# Patient Record
Sex: Female | Born: 1960 | Race: White | Hispanic: No | Marital: Married | Smoking: Current every day smoker
Health system: Southern US, Community
[De-identification: ages and names within clinical notes are randomized; demographics above are authoritative.]

## PROBLEM LIST (undated history)

## (undated) DIAGNOSIS — M545 Low back pain, unspecified: Secondary | ICD-10-CM

## (undated) DIAGNOSIS — H269 Unspecified cataract: Secondary | ICD-10-CM

## (undated) DIAGNOSIS — F329 Major depressive disorder, single episode, unspecified: Secondary | ICD-10-CM

## (undated) DIAGNOSIS — Z780 Asymptomatic menopausal state: Secondary | ICD-10-CM

## (undated) DIAGNOSIS — J45909 Unspecified asthma, uncomplicated: Secondary | ICD-10-CM

## (undated) DIAGNOSIS — F32A Depression, unspecified: Secondary | ICD-10-CM

## (undated) DIAGNOSIS — M479 Spondylosis, unspecified: Secondary | ICD-10-CM

## (undated) DIAGNOSIS — B1920 Unspecified viral hepatitis C without hepatic coma: Secondary | ICD-10-CM

## (undated) DIAGNOSIS — E0590 Thyrotoxicosis, unspecified without thyrotoxic crisis or storm: Secondary | ICD-10-CM

## (undated) DIAGNOSIS — E039 Hypothyroidism, unspecified: Secondary | ICD-10-CM

## (undated) DIAGNOSIS — M1990 Unspecified osteoarthritis, unspecified site: Secondary | ICD-10-CM

## (undated) HISTORY — DX: Depression, unspecified: F32.A

## (undated) HISTORY — DX: Thyrotoxicosis, unspecified without thyrotoxic crisis or storm: E05.90

## (undated) HISTORY — DX: Unspecified viral hepatitis C without hepatic coma: B19.20

## (undated) HISTORY — DX: Low back pain, unspecified: M54.50

## (undated) HISTORY — DX: Unspecified asthma, uncomplicated: J45.909

## (undated) HISTORY — DX: Hypothyroidism, unspecified: E03.9

## (undated) HISTORY — DX: Major depressive disorder, single episode, unspecified: F32.9

## (undated) HISTORY — DX: Unspecified cataract: H26.9

## (undated) HISTORY — PX: COLONOSCOPY: SHX174

## (undated) HISTORY — DX: Asymptomatic menopausal state: Z78.0

## (undated) HISTORY — PX: COLONOSCOPY W/ POLYPECTOMY: SHX1380

## (undated) HISTORY — DX: Unspecified osteoarthritis, unspecified site: M19.90

## (undated) HISTORY — DX: Spondylosis, unspecified: M47.9

## (undated) HISTORY — DX: Low back pain: M54.5

---

## 1998-05-15 ENCOUNTER — Other Ambulatory Visit: Admission: RE | Admit: 1998-05-15 | Discharge: 1998-05-15 | Payer: Self-pay | Admitting: Gynecology

## 2000-07-16 ENCOUNTER — Other Ambulatory Visit: Admission: RE | Admit: 2000-07-16 | Discharge: 2000-07-16 | Payer: Self-pay | Admitting: Family Medicine

## 2000-12-22 ENCOUNTER — Encounter (INDEPENDENT_AMBULATORY_CARE_PROVIDER_SITE_OTHER): Payer: Self-pay

## 2000-12-22 ENCOUNTER — Ambulatory Visit (HOSPITAL_COMMUNITY): Admission: RE | Admit: 2000-12-22 | Discharge: 2000-12-22 | Payer: Self-pay | Admitting: Gastroenterology

## 2001-04-15 ENCOUNTER — Encounter: Admission: RE | Admit: 2001-04-15 | Discharge: 2001-04-15 | Payer: Self-pay | Admitting: Family Medicine

## 2001-04-15 ENCOUNTER — Encounter: Payer: Self-pay | Admitting: Family Medicine

## 2001-08-28 ENCOUNTER — Encounter: Payer: Self-pay | Admitting: *Deleted

## 2001-08-28 ENCOUNTER — Ambulatory Visit (HOSPITAL_COMMUNITY): Admission: RE | Admit: 2001-08-28 | Discharge: 2001-08-28 | Payer: Self-pay | Admitting: *Deleted

## 2001-08-30 ENCOUNTER — Encounter: Payer: Self-pay | Admitting: *Deleted

## 2001-08-30 ENCOUNTER — Ambulatory Visit (HOSPITAL_COMMUNITY): Admission: RE | Admit: 2001-08-30 | Discharge: 2001-08-30 | Payer: Self-pay | Admitting: *Deleted

## 2001-09-17 ENCOUNTER — Ambulatory Visit (HOSPITAL_COMMUNITY): Admission: RE | Admit: 2001-09-17 | Discharge: 2001-09-17 | Payer: Self-pay | Admitting: Neurosurgery

## 2002-01-26 ENCOUNTER — Encounter: Admission: RE | Admit: 2002-01-26 | Discharge: 2002-01-26 | Payer: Self-pay

## 2002-05-01 ENCOUNTER — Emergency Department (HOSPITAL_COMMUNITY): Admission: EM | Admit: 2002-05-01 | Discharge: 2002-05-01 | Payer: Self-pay | Admitting: Emergency Medicine

## 2002-07-19 ENCOUNTER — Encounter: Payer: Self-pay | Admitting: Family Medicine

## 2002-07-19 ENCOUNTER — Ambulatory Visit (HOSPITAL_COMMUNITY): Admission: RE | Admit: 2002-07-19 | Discharge: 2002-07-19 | Payer: Self-pay | Admitting: Family Medicine

## 2003-05-01 ENCOUNTER — Encounter: Admission: RE | Admit: 2003-05-01 | Discharge: 2003-05-01 | Payer: Self-pay | Admitting: Family Medicine

## 2003-05-01 ENCOUNTER — Encounter: Payer: Self-pay | Admitting: Family Medicine

## 2003-05-16 ENCOUNTER — Encounter
Admission: RE | Admit: 2003-05-16 | Discharge: 2003-08-14 | Payer: Self-pay | Admitting: Physical Medicine & Rehabilitation

## 2003-06-19 ENCOUNTER — Other Ambulatory Visit: Admission: RE | Admit: 2003-06-19 | Discharge: 2003-06-19 | Payer: Self-pay | Admitting: Family Medicine

## 2003-07-06 ENCOUNTER — Encounter
Admission: RE | Admit: 2003-07-06 | Discharge: 2003-07-21 | Payer: Self-pay | Admitting: Physical Medicine & Rehabilitation

## 2003-11-02 ENCOUNTER — Encounter
Admission: RE | Admit: 2003-11-02 | Discharge: 2004-01-31 | Payer: Self-pay | Admitting: Physical Medicine & Rehabilitation

## 2004-01-05 ENCOUNTER — Encounter: Admission: RE | Admit: 2004-01-05 | Discharge: 2004-01-05 | Payer: Self-pay | Admitting: Family Medicine

## 2004-03-29 ENCOUNTER — Encounter
Admission: RE | Admit: 2004-03-29 | Discharge: 2004-06-27 | Payer: Self-pay | Admitting: Physical Medicine & Rehabilitation

## 2004-04-08 ENCOUNTER — Ambulatory Visit (HOSPITAL_COMMUNITY)
Admission: RE | Admit: 2004-04-08 | Discharge: 2004-04-08 | Payer: Self-pay | Admitting: Physical Medicine & Rehabilitation

## 2004-05-10 ENCOUNTER — Ambulatory Visit (HOSPITAL_COMMUNITY): Admission: RE | Admit: 2004-05-10 | Discharge: 2004-05-10 | Payer: Self-pay | Admitting: Family Medicine

## 2004-05-13 ENCOUNTER — Encounter: Admission: RE | Admit: 2004-05-13 | Discharge: 2004-05-13 | Payer: Self-pay | Admitting: Family Medicine

## 2004-06-25 ENCOUNTER — Encounter: Admission: RE | Admit: 2004-06-25 | Discharge: 2004-06-25 | Payer: Self-pay | Admitting: Family Medicine

## 2004-06-27 ENCOUNTER — Encounter
Admission: RE | Admit: 2004-06-27 | Discharge: 2004-09-25 | Payer: Self-pay | Admitting: Physical Medicine & Rehabilitation

## 2004-07-01 ENCOUNTER — Ambulatory Visit: Payer: Self-pay | Admitting: Physical Medicine & Rehabilitation

## 2004-09-26 ENCOUNTER — Encounter
Admission: RE | Admit: 2004-09-26 | Discharge: 2004-11-27 | Payer: Self-pay | Admitting: Physical Medicine & Rehabilitation

## 2004-09-30 ENCOUNTER — Ambulatory Visit: Payer: Self-pay | Admitting: Physical Medicine & Rehabilitation

## 2004-11-02 IMAGING — CR DG HAND COMPLETE 3+V*R*
4 series · 4 of 4 positions shown · non-contrast
Comparison: 01/26/02.
COMPARISON: 01/26/02.

CLINICAL DATA: Bilateral hand pain, greater on the left. 
 COMPLETE LEFT HAND

[view not recorded (1 of 4)]
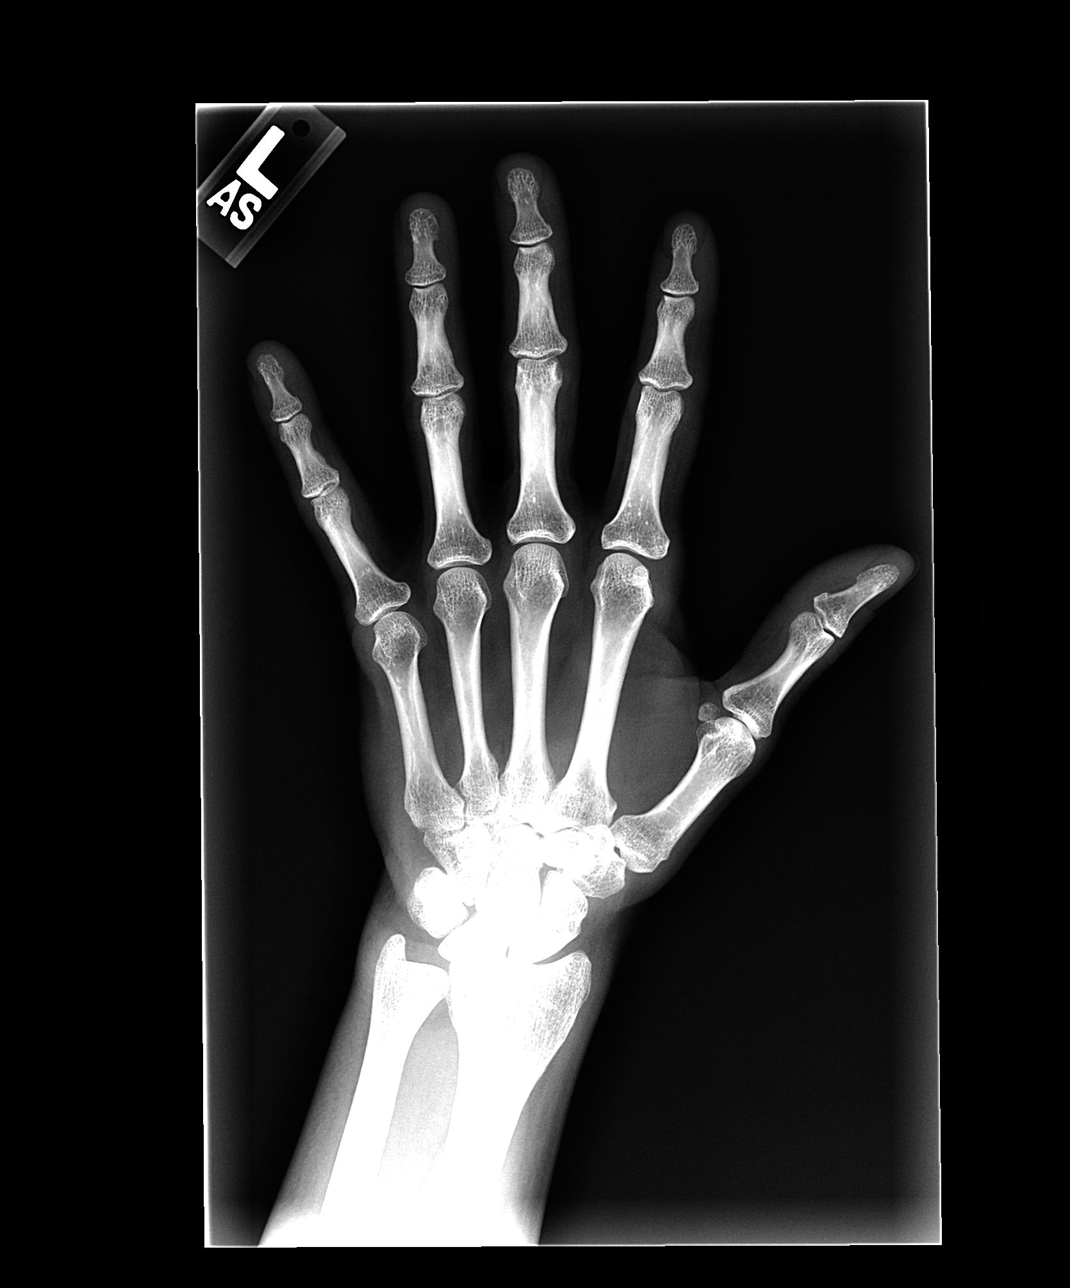

[view not recorded (2 of 4)]
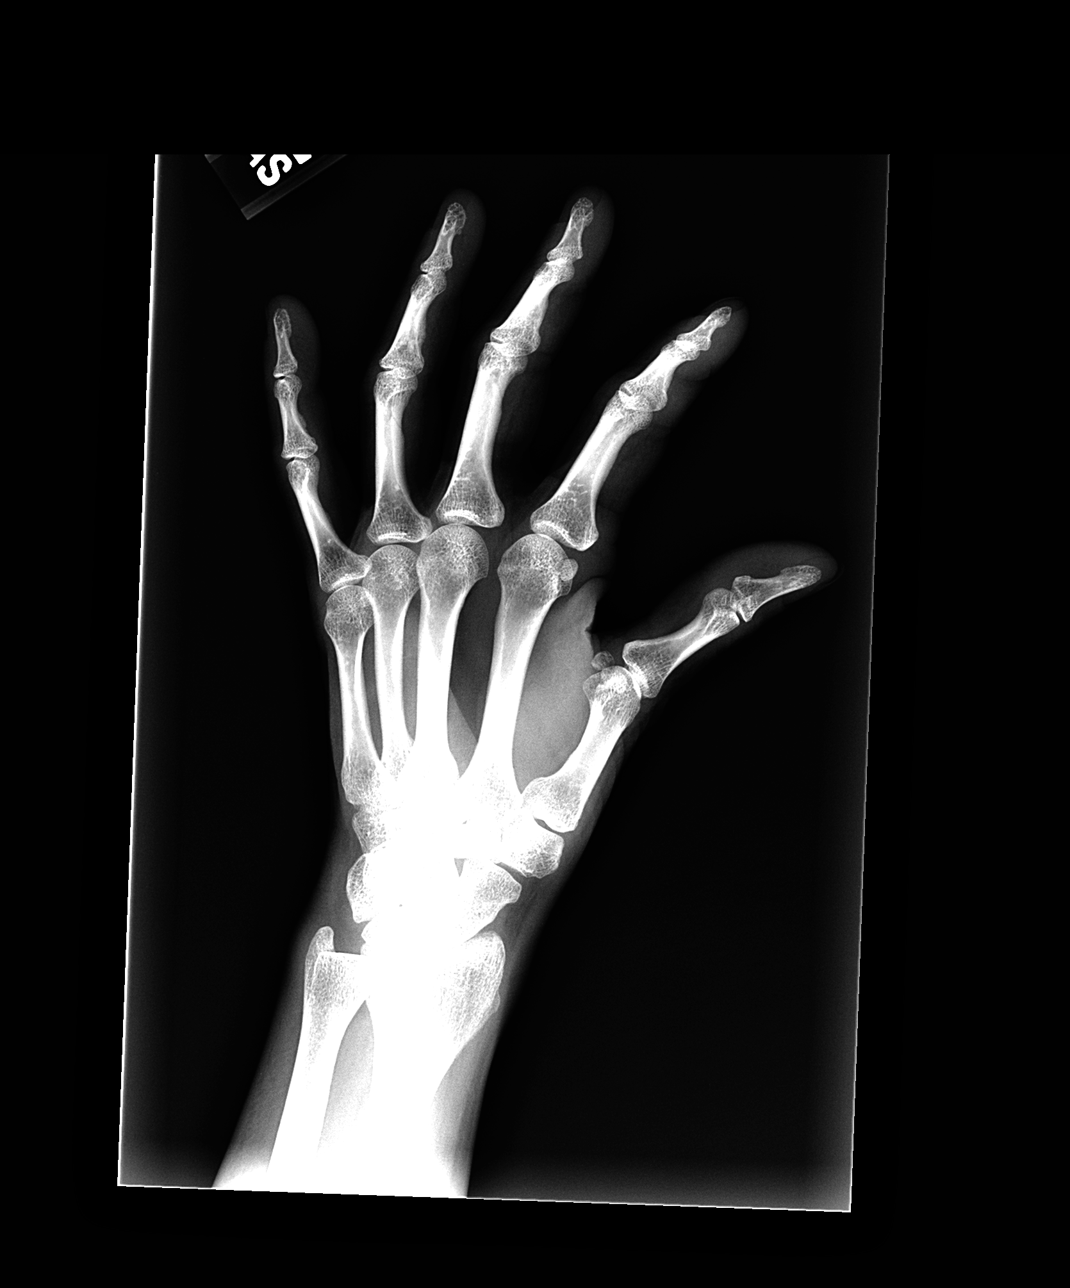

[view not recorded (3 of 4)]
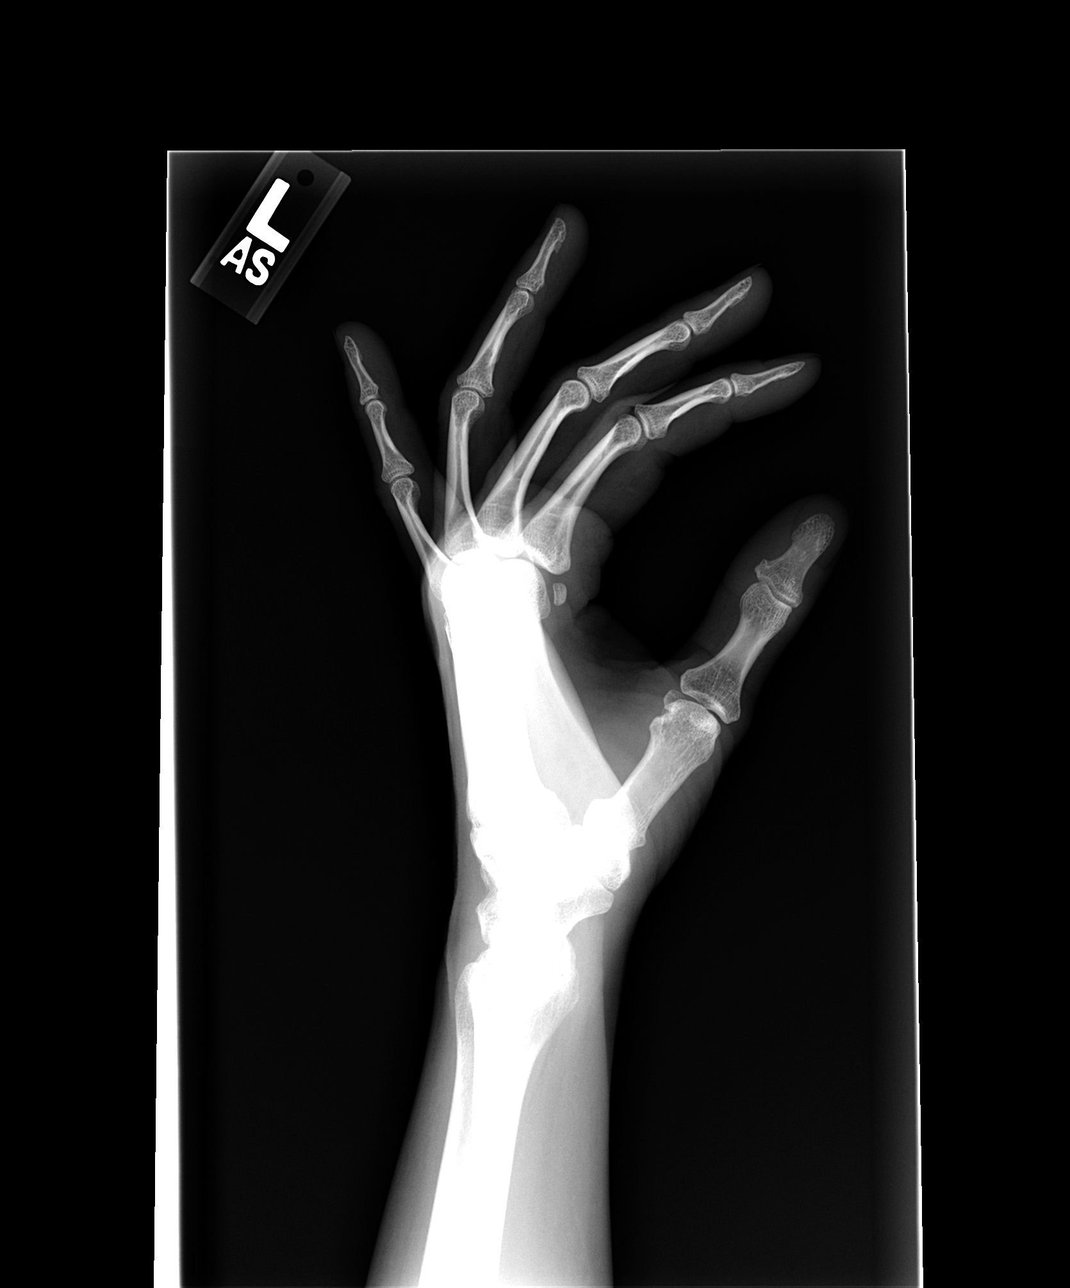

[view not recorded (4 of 4)]
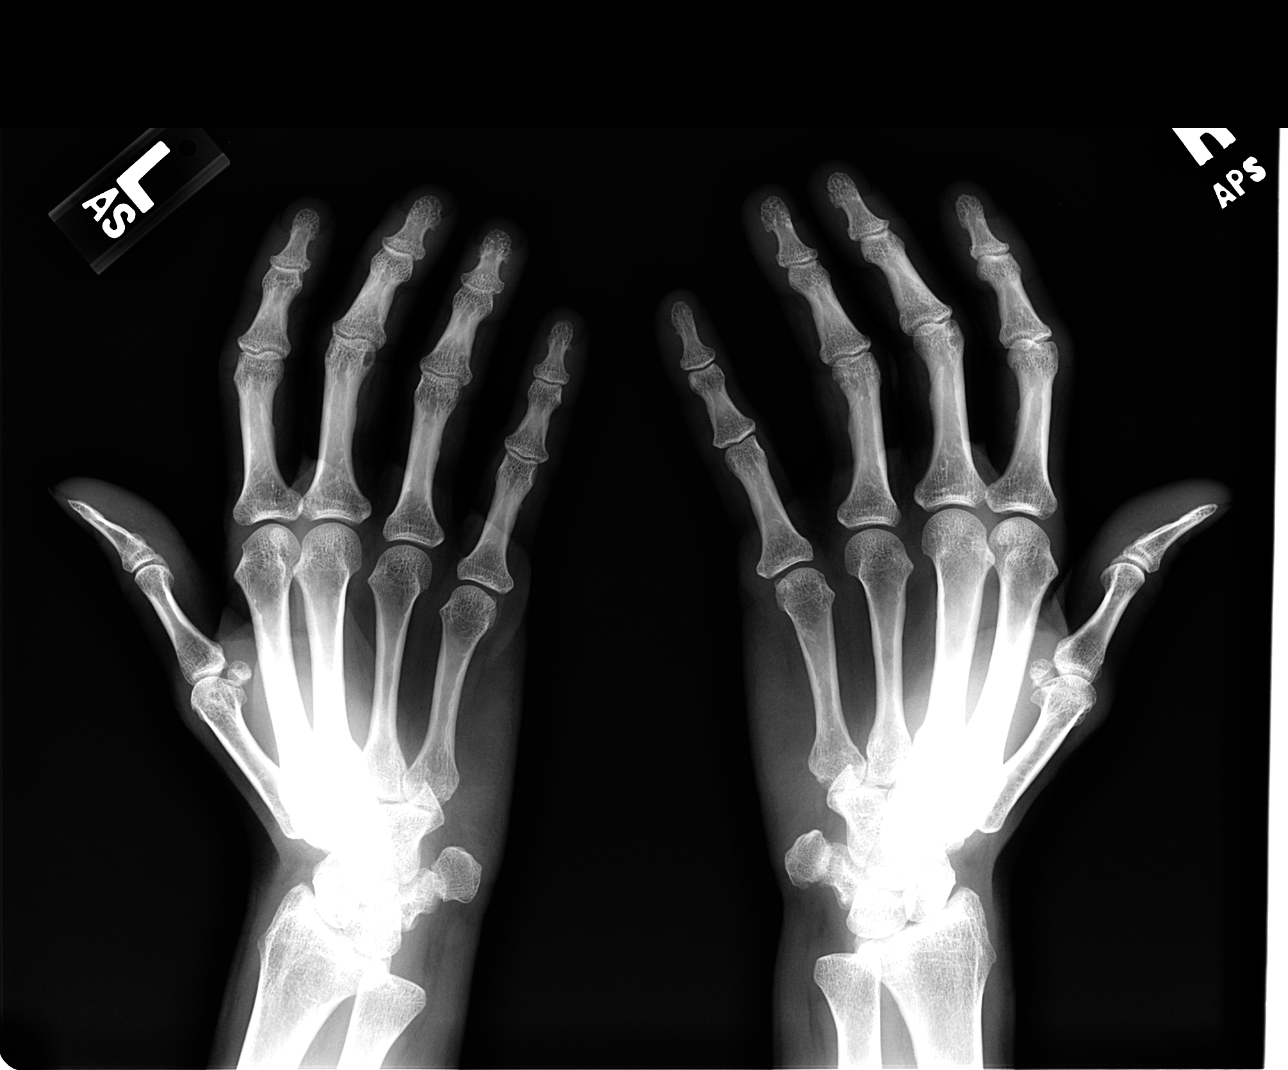

[4 of 4 positions shown; findings below may reference images not displayed]

Four views of the left hand demonstrate normal appearing bones and soft tissues.  No bone erosions, joint space narrowing or spur formation. 
 IMPRESSION
 Normal examination. 
 COMPLETE RIGHT HAND
Three views of the right hand demonstrate normal appearing bones and soft tissues.  No bone erosions, joint space narrowing or spur formation. 
 IMPRESSION
 Normal examination.

## 2004-11-25 ENCOUNTER — Encounter: Admission: RE | Admit: 2004-11-25 | Discharge: 2004-11-25 | Payer: Self-pay | Admitting: Family Medicine

## 2004-11-27 ENCOUNTER — Ambulatory Visit: Payer: Self-pay | Admitting: Physical Medicine & Rehabilitation

## 2004-11-27 ENCOUNTER — Encounter
Admission: RE | Admit: 2004-11-27 | Discharge: 2005-02-25 | Payer: Self-pay | Admitting: Physical Medicine & Rehabilitation

## 2005-08-22 ENCOUNTER — Ambulatory Visit: Payer: Self-pay | Admitting: Internal Medicine

## 2005-08-28 ENCOUNTER — Encounter: Admission: RE | Admit: 2005-08-28 | Discharge: 2005-08-28 | Payer: Self-pay | Admitting: Internal Medicine

## 2005-09-16 ENCOUNTER — Ambulatory Visit: Payer: Self-pay | Admitting: Internal Medicine

## 2005-09-19 ENCOUNTER — Ambulatory Visit: Payer: Self-pay | Admitting: Cardiology

## 2005-10-16 ENCOUNTER — Ambulatory Visit: Payer: Self-pay | Admitting: Internal Medicine

## 2005-10-20 ENCOUNTER — Ambulatory Visit (HOSPITAL_COMMUNITY): Admission: RE | Admit: 2005-10-20 | Discharge: 2005-10-20 | Payer: Self-pay | Admitting: Internal Medicine

## 2005-10-23 ENCOUNTER — Ambulatory Visit: Payer: Self-pay | Admitting: Internal Medicine

## 2005-10-27 ENCOUNTER — Ambulatory Visit (HOSPITAL_COMMUNITY): Admission: RE | Admit: 2005-10-27 | Discharge: 2005-10-27 | Payer: Self-pay | Admitting: Internal Medicine

## 2005-11-07 ENCOUNTER — Ambulatory Visit: Payer: Self-pay | Admitting: Internal Medicine

## 2005-11-17 ENCOUNTER — Ambulatory Visit: Payer: Self-pay | Admitting: Endocrinology

## 2005-11-24 ENCOUNTER — Encounter (HOSPITAL_COMMUNITY): Admission: RE | Admit: 2005-11-24 | Discharge: 2006-02-22 | Payer: Self-pay | Admitting: Endocrinology

## 2005-12-05 ENCOUNTER — Ambulatory Visit (HOSPITAL_COMMUNITY): Admission: RE | Admit: 2005-12-05 | Discharge: 2005-12-05 | Payer: Self-pay | Admitting: Endocrinology

## 2005-12-10 ENCOUNTER — Ambulatory Visit: Payer: Self-pay | Admitting: Internal Medicine

## 2005-12-25 ENCOUNTER — Ambulatory Visit: Payer: Self-pay | Admitting: Endocrinology

## 2006-01-07 ENCOUNTER — Ambulatory Visit: Payer: Self-pay | Admitting: Internal Medicine

## 2006-03-23 ENCOUNTER — Ambulatory Visit: Payer: Self-pay | Admitting: Internal Medicine

## 2006-03-31 ENCOUNTER — Ambulatory Visit: Payer: Self-pay | Admitting: Endocrinology

## 2006-05-20 ENCOUNTER — Ambulatory Visit: Payer: Self-pay | Admitting: Endocrinology

## 2006-05-21 ENCOUNTER — Ambulatory Visit: Payer: Self-pay | Admitting: Internal Medicine

## 2006-05-21 ENCOUNTER — Ambulatory Visit: Payer: Self-pay | Admitting: Endocrinology

## 2006-07-22 ENCOUNTER — Ambulatory Visit: Payer: Self-pay | Admitting: Internal Medicine

## 2006-09-16 ENCOUNTER — Ambulatory Visit: Payer: Self-pay | Admitting: Internal Medicine

## 2006-11-19 ENCOUNTER — Ambulatory Visit: Payer: Self-pay | Admitting: Internal Medicine

## 2007-01-21 ENCOUNTER — Ambulatory Visit: Payer: Self-pay | Admitting: Internal Medicine

## 2007-03-20 ENCOUNTER — Ambulatory Visit: Payer: Self-pay | Admitting: Family Medicine

## 2007-03-22 ENCOUNTER — Ambulatory Visit: Payer: Self-pay | Admitting: Internal Medicine

## 2007-03-27 ENCOUNTER — Encounter: Payer: Self-pay | Admitting: Internal Medicine

## 2007-03-27 DIAGNOSIS — M810 Age-related osteoporosis without current pathological fracture: Secondary | ICD-10-CM | POA: Insufficient documentation

## 2007-04-16 ENCOUNTER — Ambulatory Visit: Payer: Self-pay | Admitting: Internal Medicine

## 2007-06-04 DIAGNOSIS — M545 Low back pain, unspecified: Secondary | ICD-10-CM | POA: Insufficient documentation

## 2007-06-04 DIAGNOSIS — F329 Major depressive disorder, single episode, unspecified: Secondary | ICD-10-CM

## 2007-06-04 DIAGNOSIS — M1990 Unspecified osteoarthritis, unspecified site: Secondary | ICD-10-CM | POA: Insufficient documentation

## 2007-06-16 ENCOUNTER — Ambulatory Visit: Payer: Self-pay | Admitting: Internal Medicine

## 2007-06-20 ENCOUNTER — Encounter: Payer: Self-pay | Admitting: Internal Medicine

## 2007-08-11 ENCOUNTER — Encounter: Payer: Self-pay | Admitting: Internal Medicine

## 2007-08-16 ENCOUNTER — Ambulatory Visit: Payer: Self-pay | Admitting: Internal Medicine

## 2007-10-11 ENCOUNTER — Telehealth: Payer: Self-pay | Admitting: Internal Medicine

## 2007-10-25 ENCOUNTER — Ambulatory Visit: Payer: Self-pay | Admitting: Internal Medicine

## 2007-10-27 ENCOUNTER — Encounter (INDEPENDENT_AMBULATORY_CARE_PROVIDER_SITE_OTHER): Payer: Self-pay | Admitting: *Deleted

## 2007-10-29 LAB — CONVERTED CEMR LAB
CO2: 30 meq/L (ref 19–32)
Creatinine, Ser: 0.8 mg/dL (ref 0.4–1.2)
Ketones, ur: NEGATIVE mg/dL
Leukocytes, UA: NEGATIVE
Nitrite: NEGATIVE
Potassium: 4.7 meq/L (ref 3.5–5.1)
Sodium: 138 meq/L (ref 135–145)
Specific Gravity, Urine: 1.01 (ref 1.000–1.03)
Urine Glucose: NEGATIVE mg/dL
pH: 8 (ref 5.0–8.0)

## 2007-11-09 ENCOUNTER — Encounter: Admission: RE | Admit: 2007-11-09 | Discharge: 2007-11-09 | Payer: Self-pay | Admitting: Internal Medicine

## 2007-11-11 ENCOUNTER — Telehealth: Payer: Self-pay | Admitting: Internal Medicine

## 2007-11-17 ENCOUNTER — Encounter: Admission: RE | Admit: 2007-11-17 | Discharge: 2007-11-17 | Payer: Self-pay | Admitting: Internal Medicine

## 2007-12-01 ENCOUNTER — Encounter: Payer: Self-pay | Admitting: Internal Medicine

## 2008-01-11 ENCOUNTER — Ambulatory Visit: Payer: Self-pay | Admitting: Internal Medicine

## 2008-01-11 DIAGNOSIS — J209 Acute bronchitis, unspecified: Secondary | ICD-10-CM

## 2008-03-13 ENCOUNTER — Telehealth: Payer: Self-pay | Admitting: Internal Medicine

## 2008-03-20 ENCOUNTER — Ambulatory Visit: Payer: Self-pay | Admitting: Internal Medicine

## 2008-05-03 ENCOUNTER — Telehealth: Payer: Self-pay | Admitting: Internal Medicine

## 2008-05-29 ENCOUNTER — Ambulatory Visit: Payer: Self-pay | Admitting: Internal Medicine

## 2008-05-29 DIAGNOSIS — J189 Pneumonia, unspecified organism: Secondary | ICD-10-CM

## 2008-06-12 ENCOUNTER — Encounter: Payer: Self-pay | Admitting: Internal Medicine

## 2008-06-12 ENCOUNTER — Telehealth: Payer: Self-pay | Admitting: Internal Medicine

## 2008-07-11 ENCOUNTER — Telehealth: Payer: Self-pay | Admitting: Internal Medicine

## 2008-08-14 ENCOUNTER — Telehealth: Payer: Self-pay | Admitting: Internal Medicine

## 2008-08-22 ENCOUNTER — Ambulatory Visit: Payer: Self-pay | Admitting: Internal Medicine

## 2008-10-31 ENCOUNTER — Telehealth: Payer: Self-pay | Admitting: Internal Medicine

## 2008-10-31 ENCOUNTER — Emergency Department (HOSPITAL_COMMUNITY): Admission: EM | Admit: 2008-10-31 | Discharge: 2008-10-31 | Payer: Self-pay | Admitting: Emergency Medicine

## 2008-10-31 DIAGNOSIS — R109 Unspecified abdominal pain: Secondary | ICD-10-CM | POA: Insufficient documentation

## 2008-11-03 ENCOUNTER — Ambulatory Visit: Payer: Self-pay | Admitting: Internal Medicine

## 2008-11-03 DIAGNOSIS — K560 Paralytic ileus: Secondary | ICD-10-CM

## 2008-12-05 ENCOUNTER — Telehealth: Payer: Self-pay | Admitting: Internal Medicine

## 2008-12-27 ENCOUNTER — Telehealth: Payer: Self-pay | Admitting: Internal Medicine

## 2009-01-01 ENCOUNTER — Telehealth: Payer: Self-pay | Admitting: Internal Medicine

## 2009-01-04 ENCOUNTER — Ambulatory Visit: Payer: Self-pay | Admitting: Internal Medicine

## 2009-03-06 ENCOUNTER — Telehealth: Payer: Self-pay | Admitting: Internal Medicine

## 2009-03-07 ENCOUNTER — Telehealth: Payer: Self-pay | Admitting: Internal Medicine

## 2009-03-14 ENCOUNTER — Ambulatory Visit: Payer: Self-pay | Admitting: Internal Medicine

## 2009-03-14 DIAGNOSIS — L719 Rosacea, unspecified: Secondary | ICD-10-CM

## 2009-03-14 LAB — CONVERTED CEMR LAB
ALT: 16 units/L (ref 0–35)
Bilirubin Urine: NEGATIVE
CO2: 32 meq/L (ref 19–32)
Calcium: 9.1 mg/dL (ref 8.4–10.5)
Creatinine, Ser: 0.8 mg/dL (ref 0.4–1.2)
Eosinophils Relative: 1.7 % (ref 0.0–5.0)
GFR calc non Af Amer: 80.93 mL/min (ref >60)
Leukocytes, UA: NEGATIVE
Lymphs Abs: 1.3 10*3/uL (ref 0.7–4.0)
Monocytes Relative: 10.7 % (ref 3.0–12.0)
Neutro Abs: 4.4 10*3/uL (ref 1.4–7.7)
Neutrophils Relative %: 67.8 % (ref 43.0–77.0)
Nitrite: NEGATIVE
Sed Rate: 9 mm/hr (ref 0–22)
Sodium: 139 meq/L (ref 135–145)
TSH: 3.03 microintl units/mL (ref 0.35–5.50)
Total Protein: 6.5 g/dL (ref 6.0–8.3)
Urobilinogen, UA: 0.2 (ref 0.0–1.0)
Vitamin B-12: 406 pg/mL (ref 211–911)
WBC: 6.5 10*3/uL (ref 4.5–10.5)
pH: 6 (ref 5.0–8.0)

## 2009-04-23 ENCOUNTER — Telehealth: Payer: Self-pay | Admitting: Internal Medicine

## 2009-05-01 ENCOUNTER — Telehealth: Payer: Self-pay | Admitting: Internal Medicine

## 2009-05-04 ENCOUNTER — Ambulatory Visit: Payer: Self-pay | Admitting: Internal Medicine

## 2009-05-04 DIAGNOSIS — J449 Chronic obstructive pulmonary disease, unspecified: Secondary | ICD-10-CM

## 2009-06-05 ENCOUNTER — Telehealth: Payer: Self-pay | Admitting: Internal Medicine

## 2009-06-13 ENCOUNTER — Ambulatory Visit: Payer: Self-pay | Admitting: Internal Medicine

## 2009-07-25 ENCOUNTER — Ambulatory Visit: Payer: Self-pay | Admitting: Internal Medicine

## 2009-07-25 DIAGNOSIS — Z87891 Personal history of nicotine dependence: Secondary | ICD-10-CM

## 2009-09-03 ENCOUNTER — Telehealth: Payer: Self-pay | Admitting: Internal Medicine

## 2009-09-27 ENCOUNTER — Ambulatory Visit: Payer: Self-pay | Admitting: Internal Medicine

## 2009-10-22 ENCOUNTER — Telehealth: Payer: Self-pay | Admitting: Internal Medicine

## 2009-11-20 ENCOUNTER — Telehealth: Payer: Self-pay | Admitting: Internal Medicine

## 2009-11-26 ENCOUNTER — Telehealth: Payer: Self-pay | Admitting: Internal Medicine

## 2009-12-18 ENCOUNTER — Telehealth: Payer: Self-pay | Admitting: Internal Medicine

## 2010-01-17 ENCOUNTER — Telehealth: Payer: Self-pay | Admitting: Internal Medicine

## 2010-03-15 ENCOUNTER — Ambulatory Visit: Payer: Self-pay | Admitting: Internal Medicine

## 2010-03-15 DIAGNOSIS — L049 Acute lymphadenitis, unspecified: Secondary | ICD-10-CM

## 2010-03-19 ENCOUNTER — Ambulatory Visit: Payer: Self-pay | Admitting: Endocrinology

## 2010-03-19 DIAGNOSIS — E039 Hypothyroidism, unspecified: Secondary | ICD-10-CM | POA: Insufficient documentation

## 2010-03-19 DIAGNOSIS — E042 Nontoxic multinodular goiter: Secondary | ICD-10-CM | POA: Insufficient documentation

## 2010-03-22 ENCOUNTER — Encounter: Admission: RE | Admit: 2010-03-22 | Discharge: 2010-03-22 | Payer: Self-pay | Admitting: Endocrinology

## 2010-04-18 ENCOUNTER — Ambulatory Visit: Payer: Self-pay | Admitting: Internal Medicine

## 2010-07-02 ENCOUNTER — Encounter: Payer: Self-pay | Admitting: Internal Medicine

## 2010-07-19 ENCOUNTER — Telehealth: Payer: Self-pay | Admitting: Internal Medicine

## 2010-08-13 ENCOUNTER — Encounter: Payer: Self-pay | Admitting: Internal Medicine

## 2010-08-13 ENCOUNTER — Ambulatory Visit: Payer: Self-pay | Admitting: Internal Medicine

## 2010-08-13 DIAGNOSIS — J069 Acute upper respiratory infection, unspecified: Secondary | ICD-10-CM

## 2010-08-14 ENCOUNTER — Telehealth: Payer: Self-pay | Admitting: Internal Medicine

## 2010-08-15 LAB — CONVERTED CEMR LAB
AST: 23 units/L (ref 0–37)
Albumin: 4.2 g/dL (ref 3.5–5.2)
BUN: 13 mg/dL (ref 6–23)
Basophils Relative: 0.4 % (ref 0.0–3.0)
Bilirubin Urine: NEGATIVE
Bilirubin, Direct: 0 mg/dL (ref 0.0–0.3)
Calcium: 9 mg/dL (ref 8.4–10.5)
Cholesterol: 188 mg/dL (ref 0–200)
Eosinophils Absolute: 0.1 10*3/uL (ref 0.0–0.7)
Eosinophils Relative: 1.9 % (ref 0.0–5.0)
GFR calc non Af Amer: 78.2 mL/min (ref >60)
HCT: 37.7 % (ref 36.0–46.0)
Leukocytes, UA: NEGATIVE
Lymphs Abs: 1.9 10*3/uL (ref 0.7–4.0)
MCV: 94.6 fL (ref 78.0–100.0)
Monocytes Absolute: 0.6 10*3/uL (ref 0.1–1.0)
Neutro Abs: 3.1 10*3/uL (ref 1.4–7.7)
Sodium: 138 meq/L (ref 135–145)
Specific Gravity, Urine: 1.025 (ref 1.000–1.030)
TSH: 4.16 microintl units/mL (ref 0.35–5.50)
Total Protein, Urine: NEGATIVE mg/dL
Triglycerides: 64 mg/dL (ref 0.0–149.0)
Urobilinogen, UA: 0.2 (ref 0.0–1.0)
VLDL: 12.8 mg/dL (ref 0.0–40.0)
WBC: 5.8 10*3/uL (ref 4.5–10.5)

## 2010-08-16 ENCOUNTER — Telehealth: Payer: Self-pay | Admitting: Internal Medicine

## 2010-09-24 ENCOUNTER — Encounter (INDEPENDENT_AMBULATORY_CARE_PROVIDER_SITE_OTHER): Payer: Self-pay | Admitting: *Deleted

## 2010-10-06 ENCOUNTER — Encounter: Payer: Self-pay | Admitting: Family Medicine

## 2010-10-07 ENCOUNTER — Ambulatory Visit
Admission: RE | Admit: 2010-10-07 | Discharge: 2010-10-07 | Payer: Self-pay | Source: Home / Self Care | Attending: Internal Medicine | Admitting: Internal Medicine

## 2010-10-07 DIAGNOSIS — H01009 Unspecified blepharitis unspecified eye, unspecified eyelid: Secondary | ICD-10-CM | POA: Insufficient documentation

## 2010-10-14 ENCOUNTER — Ambulatory Visit: Admit: 2010-10-14 | Payer: Self-pay | Admitting: Gastroenterology

## 2010-10-17 NOTE — Progress Notes (Signed)
Summary: REFILL - PAIN MEDS  Phone Note Refill Request   Refills Requested: Medication #1:  OXYCONTIN 80 MG  TB12 TAKE 1 QID by mouth once daily Please  Medication #2:  OXYCONTIN 40 MG  TB12 1 by mouth qid.  Please Patient is requesting fill date to be on NOV 11th due to closing of pharmacy & pt will be leaving town on 07/27/10  Initial call taken by: Lamar Sprinkles, CMA,  July 19, 2010 12:36 PM  Follow-up for Phone Call        ok to ref Follow-up by: Tresa Garter MD,  July 19, 2010 1:23 PM  Additional Follow-up for Phone Call Additional follow up Details #1::        Patient notified and will pick up Additional Follow-up by: Rock Nephew CMA,  July 19, 2010 4:52 PM    New/Updated Medications: OXYCONTIN 80 MG  TB12 (OXYCODONE HCL) TAKE 1 QID by mouth once daily Please,  fill on or after 07/26/10 [BMN] OXYCONTIN 40 MG  TB12 (OXYCODONE HCL) 1 by mouth qid.  Please,  fill this rx on or after 07/26/10 [BMN] Prescriptions: OXYCONTIN 40 MG  TB12 (OXYCODONE HCL) 1 by mouth qid.  Please,  fill this rx on or after 07/26/10 Brand medically necessary #120 x 0   Entered by:   Lamar Sprinkles, CMA   Authorized by:   Tresa Garter MD   Signed by:   Lamar Sprinkles, CMA on 07/19/2010   Method used:   Print then Give to Patient   RxID:   [id number] OXYCONTIN 80 MG  TB12 (OXYCODONE HCL) TAKE 1 QID by mouth once daily Please,  fill on or after 07/26/10 Brand medically necessary #120 x 0   Entered by:   Lamar Sprinkles, CMA   Authorized by:   Tresa Garter MD   Signed by:   Lamar Sprinkles, CMA on 07/19/2010   Method used:   Print then Give to Patient   RxID:   [id number]

## 2010-10-17 NOTE — Letter (Signed)
Summary: Pre Visit Letter Revised  [facility]  [address] [place], [place] [address]   Phone: [phone]  Fax: [phone]        09/24/2010 MRN: [id number] [facility] [place] [address] [place], [place]  [address]             Procedure Date:  10-28-10   Welcome to the Gastroenterology Division at [facility].    You are scheduled to see a nurse for your pre-procedure visit on 10-14-10 at 1:30p.m. on the 3rd floor at [facility], [address].  We ask that you try to arrive at our office 15 minutes prior to your appointment time to allow for check-in.  Please take a minute to review the attached form.  If you answer "Yes" to one or more of the questions on the first page, we ask that you call the person listed at your earliest opportunity.  If you answer "No" to all of the questions, please complete the rest of the form and bring it to your appointment.    Your nurse visit will consist of discussing your medical and surgical history, your immediate family medical history, and your medications.   If you are unable to list all of your medications on the form, please bring the medication bottles to your appointment and we will list them.  We will need to be aware of both prescribed and over the counter drugs.  We will need to know exact dosage information as well.    Please be prepared to read and sign documents such as consent forms, a financial agreement, and acknowledgement forms.  If necessary, and with your consent, a friend or relative is welcome to sit-in on the nurse visit with you.  Please bring your insurance card so that we may make a copy of it.  If your insurance requires a referral to see a specialist, please bring your referral form from your primary care physician.  No co-pay is required for this nurse visit.     If you cannot keep your appointment, please call [phone] to cancel or reschedule prior to your appointment date.  This  allows [place] the opportunity to schedule an appointment for another patient in need of care.    Thank you for choosing [facility] for your medical needs.  We appreciate the opportunity to care for you.  Please visit [place] at our website  to learn more about our practice.  Sincerely, The Gastroenterology Division

## 2010-10-17 NOTE — Miscellaneous (Signed)
Summary: flu  Clinical Lists Changes  Observations: Added new observation of FLU VAX: Historical (06/28/2010 11:25)      Immunization History:  Influenza Immunization History:    Influenza:  historical (06/28/2010) given at [id number] 0.64ml Fluzone exp 6.12 lot# [id number]

## 2010-10-17 NOTE — Progress Notes (Signed)
Summary: REFILL  Phone Note Refill Request   Refills Requested: Medication #1:  OXYCONTIN 80 MG  TB12 TAKE 1 QID by mouth once daily Please  Medication #2:  OXYCONTIN 40 MG  TB12 1 by mouth qid.  Please To refill saturday ok? Pt is going out of town  Initial call taken by: Lamar Sprinkles, CMA,  December 18, 2009 8:31 AM  Follow-up for Phone Call        OK Thx Follow-up by: Tresa Garter MD,  December 18, 2009 1:13 PM  Additional Follow-up for Phone Call Additional follow up Details #1::        Rx's ready, left mess to call office back  Additional Follow-up by: Lamar Sprinkles, CMA,  December 18, 2009 2:38 PM    Additional Follow-up for Phone Call Additional follow up Details #2::    Pt informed to pick up Follow-up by: Lamar Sprinkles, CMA,  December 18, 2009 4:00 PM  New/Updated Medications: OXYCONTIN 80 MG  TB12 (OXYCODONE HCL) TAKE 1 QID by mouth once daily Please,  fill on or after 12/22/09,(note, next mth fill to be 01/25/10) [BMN] OXYCONTIN 40 MG  TB12 (OXYCODONE HCL) 1 by mouth qid.  Please,  fill this rx on or after 12/22/09 (note, next mth fill to be 01/25/10) [BMN] Prescriptions: OXYCONTIN 80 MG  TB12 (OXYCODONE HCL) TAKE 1 QID by mouth once daily Please,  fill on or after 12/22/09,(note, next mth fill to be 01/25/10) Brand medically necessary #120 x 0   Entered by:   Lamar Sprinkles, CMA   Authorized by:   Tresa Garter MD   Signed by:   Lamar Sprinkles, CMA on 12/18/2009   Method used:   Print then Give to Patient   RxID:   [id number] OXYCONTIN 40 MG  TB12 (OXYCODONE HCL) 1 by mouth qid.  Please,  fill this rx on or after 12/22/09 (note, next mth fill to be 01/25/10) Brand medically necessary #120 x 0   Entered by:   Lamar Sprinkles, CMA   Authorized by:   Tresa Garter MD   Signed by:   Lamar Sprinkles, CMA on 12/18/2009   Method used:   Print then Give to Patient   RxID:   [id number]

## 2010-10-17 NOTE — Progress Notes (Signed)
Summary: RF's Oxycontin  Phone Note Refill Request   Refills Requested: Medication #1:  OXYCONTIN 80 MG  TB12 TAKE 1 QID by mouth once daily Please  Medication #2:  OXYCONTIN 40 MG  TB12 1 by mouth qid.  Please Initial call taken by: Lamar Sprinkles, CMA,  November 20, 2009 5:27 PM  Follow-up for Phone Call        ok to ref Follow-up by: Tresa Garter MD,  November 20, 2009 10:22 PM  Additional Follow-up for Phone Call Additional follow up Details #1::        pt will be in for o/v to pick up Additional Follow-up by: Sydell Axon,  November 21, 2009 8:16 AM    New/Updated Medications: OXYCONTIN 80 MG  TB12 (OXYCODONE HCL) TAKE 1 QID by mouth once daily Please,  fill on or after 11/26/09 [BMN] OXYCONTIN 40 MG  TB12 (OXYCODONE HCL) 1 by mouth qid.  Please,  fill on or after 11/26/09 [BMN] Prescriptions: OXYCONTIN 40 MG  TB12 (OXYCODONE HCL) 1 by mouth qid.  Please,  fill on or after 11/26/09 Brand medically necessary #120 x 0   Entered by:   Lamar Sprinkles, CMA   Authorized by:   Tresa Garter MD   Signed by:   Lamar Sprinkles, CMA on 11/21/2009   Method used:   Print then Give to Patient   RxID:   [id number] OXYCONTIN 80 MG  TB12 (OXYCODONE HCL) TAKE 1 QID by mouth once daily Please,  fill on or after 11/26/09 Brand medically necessary #120 x 0   Entered by:   Lamar Sprinkles, CMA   Authorized by:   Tresa Garter MD   Signed by:   Lamar Sprinkles, CMA on 11/21/2009   Method used:   Print then Give to Patient   RxID:   [id number]

## 2010-10-17 NOTE — Assessment & Plan Note (Signed)
Summary: PER PT FU-MEDS-STC   Vital Signs:  Patient profile:   50 year old female Weight:      124 pounds Temp:     97.2 degrees F oral Pulse rate:   71 / minute BP sitting:   112 / 60  (left arm)  Vitals Entered By: Tora Perches (September 27, 2009 11:35 AM) CC: f/u Is Patient Diabetic? No   CC:  f/u.  History of Present Illness: The patient presents for a follow up of LBP. Med is too expensive - Ins pays 20% untill deduct is met. [place] may have funds avail to pay copay - needs meds 1 wk early to have 1 extra wk for processing   Preventive Screening-Counseling & Management  Alcohol-Tobacco     Smoking Status: quit  Allergies: 1)  ! Nsaids  Past History:  Past Medical History: Last updated: 08/22/2008 Osteoporosis Hepatitis C (2001) Spinal OA Menopause Mild Asthmatic Bronchitis Low back pain Osteoarthritis Depression Hyperthyroidism, s/p 131Iodine Rx 2007 Hypothyroidism  Social History: Last updated: 03/27/2007 Married Current Smoker Alcohol use-no  Review of Systems  The patient denies fever, weight loss, weight gain, and chest pain.    Physical Exam  General:  NAD Ears:  External ear exam shows no significant lesions or deformities.  Otoscopic examination reveals clear canals, tympanic membranes are intact bilaterally without bulging, retraction, inflammation or discharge. Hearing is grossly normal bilaterally. Nose:  WNL Mouth:  WNL Lungs:  CTA Heart:  RRR Msk:  Lumbar-sacral spine is tender to palpation over paraspinal muscles and painfull with the ROM  Neurologic:  alert & oriented X3.   Skin:  Intact without suspicious lesions or rashes Psych:  Cognition and judgment appear intact. Alert and cooperative with normal attention span and concentration. No apparent delusions, illusions, hallucinations   Impression & Recommendations:  Problem # 1:  LOW BACK PAIN (ICD-724.2) Assessment Unchanged  Her updated medication list for this  problem includes:    Oxycontin 80 Mg Tb12 (Oxycodone hcl) .[name]... Take 1 qid by mouth once daily please,  fill on or after 09/28/09    Oxycontin 40 Mg Tb12 (Oxycodone hcl) .[name]... 1 by mouth qid.  please,  fill on or after 09/28/09  Complete Medication List: 1)  Oxycontin 80 Mg Tb12 (Oxycodone hcl) .... Take 1 qid by mouth once daily please,  fill on or after 09/28/09 2)  Oxycontin 40 Mg Tb12 (Oxycodone hcl) .[name].. 1 by mouth qid.  please,  fill on or after 09/28/09 3)  Lasix 40 Mg Tabs (Furosemide) .... Take 1 by mouth qd 4)  Klor-con 20 Meq Pack (Potassium chloride) .... Take 1 by mouth qd 5)  Vitamin D3 1000 Unit Tabs (Cholecalciferol) .[name].. 1 by mouth daily 6)  Advair Hfa 45-21 Mcg/act Aero (Fluticasone-salmeterol) .[name].. 1 inh bid  Patient Instructions: 1)  Please schedule a follow-up appointment in 2 months. Prescriptions: OXYCONTIN 40 MG  TB12 (OXYCODONE HCL) 1 by mouth qid.  Please,  fill on or after 09/28/09 Brand medically necessary #120 x 0   Entered and Authorized by:   Tresa Garter MD   Signed by:   Tresa Garter MD on 09/27/2009   Method used:   Print then Give to Patient   RxID:   [id number] OXYCONTIN 80 MG  TB12 (OXYCODONE HCL) TAKE 1 QID by mouth once daily Please,  fill on or after 09/28/09 Brand medically necessary #120 x 0   Entered and Authorized by:   Tresa Garter MD  Signed by:   Tresa Garter MD on 09/27/2009   Method used:   Print then Give to Patient   RxID:   [id number]

## 2010-10-17 NOTE — Assessment & Plan Note (Signed)
Summary: medication check/#?cd   Vital Signs:  Patient profile:   50 year old female Height:      66 inches Weight:      124 pounds BMI:     20.09 Temp:     98.3 degrees F oral Pulse rate:   88 / minute Pulse rhythm:   regular Resp:     16 per minute BP sitting:   100 / 60  (left arm) Cuff size:   regular  Vitals Entered By: Lanier Prude, CMA(AAMA) (April 18, 2010 11:08 AM) CC: f/u  Is Patient Diabetic? No   Primary Care Abigail Wilson:  Tresa Garter MD  CC:  f/u .  History of Present Illness: The patient presents for a follow up of back pain, anxiety, depression.   Current Medications (verified): 1)  Oxycontin 80 Mg  Tb12 (Oxycodone Hcl) .... Take 1 Qid By Mouth Once Daily Please,  Fill On or After 03/27/10 2)  Oxycontin 40 Mg  Tb12 (Oxycodone Hcl) .[name].. 1 By Mouth Qid.  Please,  Fill This Rx On or After 03/27/10 3)  Lasix 40 Mg  Tabs (Furosemide) .... Take 1 By Mouth Qd 4)  Klor-Con 20 Meq  Pack (Potassium Chloride) .... Take 1 By Mouth Qd 5)  Vitamin D3 1000 Unit  Tabs (Cholecalciferol) .[name].. 1 By Mouth Daily 6)  Advair Hfa 45-21 Mcg/act Aero (Fluticasone-Salmeterol) .[name].. 1 Inh Bid 7)  Chantix Continuing Month Pak 1 Mg Tabs (Varenicline Tartrate) .... Use Asd 1 By Mouth Once Daily 8)  Aspirin 81 Mg Tbec (Aspirin) .[name].. 1 Once Daily 9)  Coenzyme Q10 10 Mg Caps (Coenzyme Q10) 10)  Calcium Citrate +  Tabs (Multiple Minerals-Vitamins) .[name].. 1 Once Daily  Allergies (verified): 1)  ! Nsaids  Past History:  Past Medical History: Last updated: 08/22/2008 Osteoporosis Hepatitis C (2001) Spinal OA Menopause Mild Asthmatic Bronchitis Low back pain Osteoarthritis Depression Hyperthyroidism, s/p 131Iodine Rx 2007 Hypothyroidism  Social History: Last updated: 03/15/2010 Married Current Smoker Alcohol use-no Retired  Physical Exam  General:  alert, well-developed, well-nourished, well-hydrated, appropriate dress, normal appearance, healthy-appearing, and  underweight appearing.   Nose:  External nasal examination shows no deformity or inflammation. Nasal mucosa are pink and moist without lesions or exudates. Mouth:  good dentition, no gingival abnormalities, pharynx pink and moist, no erythema, no exudates, no posterior lymphoid hypertrophy, no postnasal drip, no pharyngeal crowing, no lesions, no aphthous ulcers, no erosions, no leukoplakia, and no petechiae.   Neck:  supple, full ROM, no thyromegaly, normal carotid upstroke, no carotid bruits, and cervical lymphadenopathy L>R.   Lungs:  normal respiratory effort, no intercostal retractions, no accessory muscle use, normal breath sounds, no dullness, no fremitus, no crackles, and no wheezes.   Heart:  normal rate, regular rhythm, no murmur, no gallop, no rub, and no JVD.   Abdomen:  soft, non-tender, normal bowel sounds, no distention, no masses, no guarding, no rigidity, no rebound tenderness, no abdominal hernia, no inguinal hernia, no hepatomegaly, and no splenomegaly.   Msk:  No deformity or scoliosis noted of thoracic or lumbar spine.   Pulses:  R and L carotid,radial,femoral,dorsalis pedis and posterior tibial pulses are full and equal bilaterally Extremities:  No clubbing, cyanosis, edema, or deformity noted with normal full range of motion of all joints.   Neurologic:  No cranial nerve deficits noted. Station and gait are normal. Plantar reflexes are down-going bilaterally. DTRs are symmetrical throughout. Sensory, motor and coordinative functions appear intact. Skin:  turgor normal, color  normal, no rashes, no suspicious lesions, no ecchymoses, no petechiae, no purpura, no ulcerations, and no edema.   Inguinal Nodes:  no R inguinal adenopathy and no L inguinal adenopathy.   Psych:  Cognition and judgment appear intact. Alert and cooperative with normal attention span and concentration. No apparent delusions, illusions, hallucinations   Impression & Recommendations:  Problem # 1:   HYPOTHYROIDISM (ICD-244.9) Assessment Unchanged  Problem # 2:  DEPRESSION (ICD-311)  Problem # 3:  LOW BACK PAIN (ICD-724.2)  Her updated medication list for this problem includes:    Oxycontin 80 Mg Tb12 (Oxycodone hcl) .[name]... Take 1 qid by mouth once daily please,  fill on or after 06/27/10    Oxycontin 40 Mg Tb12 (Oxycodone hcl) .[name]... 1 by mouth qid.  please,  fill this rx on or after 06/27/10    Aspirin 81 Mg Tbec (Aspirin) .[name]... 1 once daily  Problem # 4:  COPD (ICD-496)  Her updated medication list for this problem includes:    Advair Hfa 45-21 Mcg/act Aero (Fluticasone-salmeterol) .[name]... 1 inh bid  Complete Medication List: 1)  Oxycontin 80 Mg Tb12 (Oxycodone hcl) .... Take 1 qid by mouth once daily please,  fill on or after 06/27/10 2)  Oxycontin 40 Mg Tb12 (Oxycodone hcl) .[name].. 1 by mouth qid.  please,  fill this rx on or after 06/27/10 3)  Lasix 40 Mg Tabs (Furosemide) .... Take 1 by mouth qd 4)  Klor-con 20 Meq Pack (Potassium chloride) .... Take 1 by mouth qd 5)  Vitamin D3 1000 Unit Tabs (Cholecalciferol) .[name].. 1 by mouth daily 6)  Advair Hfa 45-21 Mcg/act Aero (Fluticasone-salmeterol) .[name].. 1 inh bid 7)  Aspirin 81 Mg Tbec (Aspirin) .[name].. 1 once daily 8)  Coenzyme Q10 10 Mg Caps (Coenzyme q10) 9)  Calcium Citrate + Tabs (Multiple minerals-vitamins) .[name].. 1 once daily 10)  Nasacort Aq 55 Mcg/act Aers (Triamcinolone acetonide(nasal)) .[name].. 1 spray in each nostril daily for rhinitis  Patient Instructions: 1)  Please schedule a follow-up appointment in 3 months well w/labs. 2)  Use stretching and balance exercises that I have provided (15 min. or longer every day) Prescriptions: OXYCONTIN 40 MG  TB12 (OXYCODONE HCL) 1 by mouth qid.  Please,  fill this rx on or after 06/27/10 Brand medically necessary #120 x 0   Entered and Authorized by:   Tresa Garter MD   Signed by:   Tresa Garter MD on 04/18/2010   Method used:   Print then Give to Patient   RxID:    [id number] OXYCONTIN 80 MG  TB12 (OXYCODONE HCL) TAKE 1 QID by mouth once daily Please,  fill on or after 06/27/10 Brand medically necessary #120 x 0   Entered and Authorized by:   Tresa Garter MD   Signed by:   Tresa Garter MD on 04/18/2010   Method used:   Print then Give to Patient   RxID:   [id number] NASACORT AQ 55 MCG/ACT AERS (TRIAMCINOLONE ACETONIDE(NASAL)) 1 spray in each nostril daily for rhinitis  #1 x 12   Entered and Authorized by:   Tresa Garter MD   Signed by:   Tresa Garter MD on 04/18/2010   Method used:   Print then Give to Patient   RxID:   [id number] OXYCONTIN 40 MG  TB12 (OXYCODONE HCL) 1 by mouth qid.  Please,  fill this rx on or after 05/28/10 Brand medically necessary #120 x 0   Entered and Authorized by:  Tresa Garter MD   Signed by:   Tresa Garter MD on 04/18/2010   Method used:   Print then Give to Patient   RxID:   [id number] OXYCONTIN 80 MG  TB12 (OXYCODONE HCL) TAKE 1 QID by mouth once daily Please,  fill on or after 05/28/10 Brand medically necessary #120 x 0   Entered and Authorized by:   Tresa Garter MD   Signed by:   Tresa Garter MD on 04/18/2010   Method used:   Print then Give to Patient   RxID:   [id number] OXYCONTIN 40 MG  TB12 (OXYCODONE HCL) 1 by mouth qid.  Please,  fill this rx on or after 04/27/10 Brand medically necessary #120 x 0   Entered and Authorized by:   Tresa Garter MD   Signed by:   Tresa Garter MD on 04/18/2010   Method used:   Print then Give to Patient   RxID:   [id number] OXYCONTIN 80 MG  TB12 (OXYCODONE HCL) TAKE 1 QID by mouth once daily Please,  fill on or after 04/27/10 Brand medically necessary #120 x 0   Entered and Authorized by:   Tresa Garter MD   Signed by:   Tresa Garter MD on 04/18/2010   Method used:   Print then Give to Patient   RxID:   [id number]

## 2010-10-17 NOTE — Progress Notes (Signed)
Summary: REQ FOR ALT RX  Phone Note Call from Patient   Summary of Call: Pt continues to c/o congestion & productive cough w/yellow-green mucus. Patient is requesting different antibiotic.  Initial call taken by: Lamar Sprinkles, CMA,  August 16, 2010 9:55 AM  Follow-up for Phone Call        ok Ceftin Thank you!  Follow-up by: Tresa Garter MD,  August 16, 2010 1:12 PM    New/Updated Medications: CEFTIN 500 MG TABS (CEFUROXIME AXETIL) 1 by mouth bid Prescriptions: CEFTIN 500 MG TABS (CEFUROXIME AXETIL) 1 by mouth bid  #20 x 1   Entered by:   Lamar Sprinkles, CMA   Authorized by:   Tresa Garter MD   Signed by:   Lamar Sprinkles, CMA on 08/16/2010   Method used:   Electronically to        [facility]* (retail)       [address]       [place], [place]  [address]       Ph: [phone]       Fax:    RxID:   [id number] CEFTIN 500 MG TABS (CEFUROXIME AXETIL) 1 by mouth bid  #20 x 1   Entered and Authorized by:   Tresa Garter MD   Signed by:   Lamar Sprinkles, CMA on 08/16/2010   Method used:   Electronically to        [facility]* (retail)       [address] [place], [place]  [address]       Ph: [phone] or [phone]       Fax: [phone]   RxID:   [id number]

## 2010-10-17 NOTE — Progress Notes (Signed)
Summary: REQ FOR RX  Phone Note Call from Patient Call back at Home Phone [phone]   Summary of Call: Pt c/o "same thing as son" sore throat, sinus congestion & pain. Patient is requesting rx for antibiotic and night cough med.  [facility] Initial call taken by: Lamar Sprinkles, CMA,  August 14, 2010 11:27 AM  Follow-up for Phone Call        ok antibiotic and tessalon Follow-up by: Tresa Garter MD,  August 14, 2010 1:06 PM  Additional Follow-up for Phone Call Additional follow up Details #1::        Pt informed  Additional Follow-up by: Lamar Sprinkles, CMA,  August 14, 2010 4:10 PM    Prescriptions: TESSALON PERLES 100 MG CAPS (BENZONATATE) 1-2 by mouth two times a day as needed cogh  #120 x 1   Entered by:   Lamar Sprinkles, CMA   Authorized by:   Tresa Garter MD   Signed by:   Lamar Sprinkles, CMA on 08/14/2010   Method used:   Electronically to        [facility]* (retail)       [address]       [place], [place]  [address]       Ph: [phone]       Fax:    RxID:   [id number] ZITHROMAX Z-PAK 250 MG TABS (AZITHROMYCIN) as dirrected  #1 x 0   Entered by:   Lamar Sprinkles, CMA   Authorized by:   Tresa Garter MD   Signed by:   Lamar Sprinkles, CMA on 08/14/2010   Method used:   Electronically to        [facility]* (retail)       [address]       [place], [place]  [address]       Ph: [phone]       Fax:    RxID:   [id number]

## 2010-10-17 NOTE — Assessment & Plan Note (Signed)
Summary: 2mos f/u / #/ cd   Vital Signs:  Patient profile:   50 year old female Height:      66 inches Weight:      122 pounds BMI:     19.76 Temp:     98.5 degrees F oral Pulse rate:   76 / minute Pulse rhythm:   regular Resp:     16 per minute BP sitting:   100 / 68  (left arm) Cuff size:   regular  Vitals Entered By: Lanier Prude, CMA(AAMA) (October 07, 2010 1:32 PM) CC: 2 mo f/u  Is Patient Diabetic? No Comments pt needs Rf on Oxycontin 80mg  and 40mg , Nasacort and ProAir.  Pt never filled Tessalon Perles because they were too expensive   Primary Care Provider:  Tresa Garter MD  CC:  2 mo f/u .  History of Present Illness: The patient presents for a follow up of back pain, asthmatic bronchitis, rhinitis  Current Medications (verified): 1)  Oxycontin 80 Mg  Tb12 (Oxycodone Hcl) .... Take 1 Qid By Mouth Once Daily Please,  Fill On or After 09/25/10 2)  Oxycontin 40 Mg  Tb12 (Oxycodone Hcl) .[name].. 1 By Mouth Qid.  Please,  Fill This Rx On or After 07/27/11 3)  Lasix 40 Mg  Tabs (Furosemide) .... Take 1 By Mouth Qd 4)  Klor-Con 20 Meq  Pack (Potassium Chloride) .... Take 1 By Mouth Qd 5)  Vitamin D3 1000 Unit  Tabs (Cholecalciferol) .[name].. 1 By Mouth Daily 6)  Advair Hfa 45-21 Mcg/act Aero (Fluticasone-Salmeterol) .[name].. 1 Inh Bid 7)  Aspirin 81 Mg Tbec (Aspirin) .[name].. 1 Once Daily 8)  Coenzyme Q10 10 Mg Caps (Coenzyme Q10) 9)  Calcium Citrate +  Tabs (Multiple Minerals-Vitamins) .[name].. 1 Once Daily 10)  Nasacort Aq 55 Mcg/act Aers (Triamcinolone Acetonide(Nasal)) .[name].. 1 Spray in Each Nostril Daily For Rhinitis 11)  Tessalon Perles 100 Mg Caps (Benzonatate) .[name].. 1-2 By Mouth Two Times A Day As Needed Cogh  Allergies (verified): 1)  ! Nsaids  Past History:  Past Medical History: Last updated: 08/22/2008 Osteoporosis Hepatitis C (2001) Spinal OA Menopause Mild Asthmatic Bronchitis Low back pain Osteoarthritis Depression Hyperthyroidism, s/p 131Iodine Rx  2007 Hypothyroidism  Past Surgical History: Last updated: 03/27/2007 PFT (11/07/2005) EKG (08/22/2005)  Family History: Last updated: 03/27/2007 Family History Hypertension  Social History: Last updated: 03/15/2010 Married Current Smoker Alcohol use-no Retired  Review of Systems  The patient denies fever, weight loss, dyspnea on exertion, peripheral edema, and abdominal pain.    Physical Exam  General:  alert, well-developed, well-nourished, well-hydrated, appropriate dress, normal appearance, healthy-appearing, and underweight appearing.   Eyes:  eryth eyelids Nose:  External nasal examination shows no deformity or inflammation. Nasal mucosa are pink and moist without lesions or exudates. Mouth:  good dentition, no gingival abnormalities, pharynx pink and moist, no erythema, no exudates, no posterior lymphoid hypertrophy, no postnasal drip, no pharyngeal crowing, no lesions, no aphthous ulcers, no erosions, no leukoplakia, and no petechiae.  Eryth. throat Neck:  supple, full ROM, no thyromegaly, normal carotid upstroke, no carotid bruits, and cervical lymphadenopathy L>R.   Lungs:  normal respiratory effort, no intercostal retractions, no accessory muscle use, normal breath sounds, no dullness, no fremitus, no crackles, and no wheezes.   Heart:  normal rate, regular rhythm, no murmur, no gallop, no rub, and no JVD.   Abdomen:  soft, non-tender, normal bowel sounds, no distention, no masses, no guarding, no rigidity, no rebound tenderness, no abdominal hernia,  no inguinal hernia, no hepatomegaly, and no splenomegaly.   Msk:  No deformity or scoliosis noted of thoracic or lumbar spine.  Lumbar-sacral spine is tender to palpation over paraspinal muscles and painfull with the ROM  Extremities:  No clubbing, cyanosis, edema, or deformity noted with normal full range of motion of all joints.   Neurologic:  No cranial nerve deficits noted. Station and gait are normal. Plantar reflexes are  down-going bilaterally. DTRs are symmetrical throughout. Sensory, motor and coordinative functions appear intact. Skin:  Intact without suspicious lesions or rashes Psych:  Cognition and judgment appear intact. Alert and cooperative with normal attention span and concentration. No apparent delusions, illusions, hallucinations   Impression & Recommendations:  Problem # 1:  LOW BACK PAIN (ICD-724.2) Assessment Unchanged  Her updated medication list for this problem includes:    Oxycontin 80 Mg Tb12 (Oxycodone hcl) .[name]... Take 1 qid by mouth once daily please,  fill on or after 11/24/10    Oxycontin 40 Mg Tb12 (Oxycodone hcl) .[name]... 1 by mouth qid.  please,  fill this rx on or after 11/24/10    Aspirin 81 Mg Tbec (Aspirin) .[name]... 1 once daily  Problem # 2:  OSTEOARTHRITIS (ICD-715.90) Assessment: Unchanged  Her updated medication list for this problem includes:    Oxycontin 80 Mg Tb12 (Oxycodone hcl) .[name]... Take 1 qid by mouth once daily please,  fill on or after 11/24/10    Oxycontin 40 Mg Tb12 (Oxycodone hcl) .[name]... 1 by mouth qid.  please,  fill this rx on or after 11/24/10    Aspirin 81 Mg Tbec (Aspirin) .[name]... 1 once daily  Problem # 3:  HYPOTHYROIDISM, POST-RADIATION (ICD-244.1) Assessment: Comment Only  Problem # 4:  BLEPHARITIS (ICD-373.00) Assessment: New Erythro oint bid  Complete Medication List: 1)  Oxycontin 80 Mg Tb12 (Oxycodone hcl) .... Take 1 qid by mouth once daily please,  fill on or after 11/24/10 2)  Oxycontin 40 Mg Tb12 (Oxycodone hcl) .[name].. 1 by mouth qid.  please,  fill this rx on or after 11/24/10 3)  Lasix 40 Mg Tabs (Furosemide) .... Take 1 by mouth qd 4)  Klor-con 20 Meq Pack (Potassium chloride) .... Take 1 by mouth qd 5)  Vitamin D3 1000 Unit Tabs (Cholecalciferol) .[name].. 1 by mouth daily 6)  Aspirin 81 Mg Tbec (Aspirin) .[name].. 1 once daily 7)  Coenzyme Q10 10 Mg Caps (Coenzyme q10) 8)  Calcium Citrate + Tabs (Multiple minerals-vitamins) .[name].. 1 once daily 9)  Nasacort  Aq 55 Mcg/act Aers (Triamcinolone acetonide(nasal)) .[name].. 1 spray in each nostril daily for rhinitis 10)  Tessalon Perles 100 Mg Caps (Benzonatate) .[name].. 1-2 by mouth two times a day as needed cogh 11)  Proair Hfa 108 (90 Base) Mcg/act Aers (Albuterol sulfate) .... 2 inh qid as needed 12)  Erythromycin 5 Mg/gm Oint (Erythromycin) .... In affected eye(s)  two times a day  Patient Instructions: 1)  Please schedule a follow-up appointment in 2 months. Prescriptions: ERYTHROMYCIN 5 MG/GM OINT (ERYTHROMYCIN) in affected eye(s)  two times a day  #1 tube x 1   Entered and Authorized by:   Tresa Garter MD   Signed by:   Tresa Garter MD on 10/07/2010   Method used:   Print then Give to Patient   RxID:   [id number] OXYCONTIN 40 MG  TB12 (OXYCODONE HCL) 1 by mouth qid.  Please,  fill this rx on or after 11/24/10 Brand medically necessary #120 x 0   Entered and Authorized  by:   Tresa Garter MD   Signed by:   Tresa Garter MD on 10/07/2010   Method used:   Print then Give to Patient   RxID:   [id number] OXYCONTIN 80 MG  TB12 (OXYCODONE HCL) TAKE 1 QID by mouth once daily Please,  fill on or after 11/24/10 Brand medically necessary #120 x 0   Entered and Authorized by:   Tresa Garter MD   Signed by:   Tresa Garter MD on 10/07/2010   Method used:   Print then Give to Patient   RxID:   [id number] OXYCONTIN 40 MG  TB12 (OXYCODONE HCL) 1 by mouth qid.  Please,  fill this rx on or after 10/26/10 Brand medically necessary #120 x 0   Entered and Authorized by:   Tresa Garter MD   Signed by:   Tresa Garter MD on 10/07/2010   Method used:   Print then Give to Patient   RxID:   [id number] OXYCONTIN 80 MG  TB12 (OXYCODONE HCL) TAKE 1 QID by mouth once daily Please,  fill on or after 10/26/10 Brand medically necessary #120 x 0   Entered and Authorized by:   Tresa Garter MD   Signed by:   Tresa Garter MD on 10/07/2010   Method  used:   Print then Give to Patient   RxID:   [id number] PROAIR HFA 108 (90 BASE) MCG/ACT AERS (ALBUTEROL SULFATE) 2 inh qid as needed  #1 x 6   Entered and Authorized by:   Tresa Garter MD   Signed by:   Tresa Garter MD on 10/07/2010   Method used:   Print then Give to Patient   RxID:   [id number]    Orders Added: 1)  Est. Patient Level IV [51884]

## 2010-10-17 NOTE — Assessment & Plan Note (Signed)
Summary: CPX/LAB SAME DAY MEDICARE/PN   Vital Signs:  Patient profile:   50 year old female Height:      66 inches Weight:      123 pounds BMI:     19.92 Temp:     98.3 degrees F oral Pulse rate:   64 / minute Pulse rhythm:   regular Resp:     16 per minute BP sitting:   90 / 54  (right arm) Cuff size:   regular  Vitals Entered By: Lanier Prude, Beverly Gust) (August 13, 2010 9:38 AM) CC: MWV Is Patient Diabetic? No   Primary Care Provider:  Tresa Garter MD  CC:  MWV.  History of Present Illness: The patient presents for a preventive health examination  Patient past medical history, social history, and family history reviewed in detail no significant changes.  Patient is physically active. Depression is negative and mood is good. Hearing is normal, and able to perform activities of daily living. Risk of falling is negligible and home safety has been reviewed and is appropriate. Patient has normal height, subnormal weight, and visual acuity. Patient has been counseled on age-appropriate routine health concerns for screening and prevention. Education, counseling done.  C/o URI symptoms  F/u LBP, hypothyroidism  Preventive Screening-Counseling & Management  Alcohol-Tobacco     Alcohol drinks/day: 0     Smoking Status: current     Packs/Day: 0.5  Caffeine-Diet-Exercise     Caffeine use/day: 4     Does Patient Exercise: yes     Type of exercise: walking/yoga     Times/week: <3  Hep-HIV-STD-Contraception     Hepatitis Risk: no risk noted     Dental Visit-last 6 months yes     SBE monthly: no     Sun Exposure-Excessive: yes  Safety-Violence-Falls     Seat Belt Use: yes     Helmet Use: n/a     Firearms in the Home: no firearms in the home     Smoke Detectors: yes     Violence in the Home: no risk noted     Sexual Abuse: no     Fall Risk: no      Sexual History:  currently monogamous.    Current Medications (verified): 1)  Oxycontin 80 Mg  Tb12 (Oxycodone  Hcl) .... Take 1 Qid By Mouth Once Daily Please,  Fill On or After 07/26/10 2)  Oxycontin 40 Mg  Tb12 (Oxycodone Hcl) .[name].. 1 By Mouth Qid.  Please,  Fill This Rx On or After 07/26/10 3)  Lasix 40 Mg  Tabs (Furosemide) .... Take 1 By Mouth Qd 4)  Klor-Con 20 Meq  Pack (Potassium Chloride) .... Take 1 By Mouth Qd 5)  Vitamin D3 1000 Unit  Tabs (Cholecalciferol) .[name].. 1 By Mouth Daily 6)  Advair Hfa 45-21 Mcg/act Aero (Fluticasone-Salmeterol) .[name].. 1 Inh Bid 7)  Aspirin 81 Mg Tbec (Aspirin) .[name].. 1 Once Daily 8)  Coenzyme Q10 10 Mg Caps (Coenzyme Q10) 9)  Calcium Citrate +  Tabs (Multiple Minerals-Vitamins) .[name].. 1 Once Daily 10)  Nasacort Aq 55 Mcg/act Aers (Triamcinolone Acetonide(Nasal)) .[name].. 1 Spray in Each Nostril Daily For Rhinitis  Allergies (verified): 1)  ! Nsaids  Past History:  Past Medical History: Last updated: 08/22/2008 Osteoporosis Hepatitis C (2001) Spinal OA Menopause Mild Asthmatic Bronchitis Low back pain Osteoarthritis Depression Hyperthyroidism, s/p 131Iodine Rx 2007 Hypothyroidism  Past Surgical History: Last updated: 03/27/2007 PFT (11/07/2005) EKG (08/22/2005)  Family History: Last updated: 03/27/2007 Family History Hypertension  Social  History: Last updated: 03/15/2010 Married Current Smoker Alcohol use-no Retired  Social History: Smoking Status:  current Packs/Day:  0.5 Caffeine use/day:  4 Does Patient Exercise:  yes Dental Care w/in 6 mos.:  yes Sun Exposure-Excessive:  yes Seat Belt Use:  yes Fall Risk:  no Hepatitis Risk:  no risk noted Sexual History:  currently monogamous  Review of Systems  The patient denies anorexia, fever, weight loss, weight gain, vision loss, decreased hearing, hoarseness, chest pain, syncope, dyspnea on exertion, peripheral edema, prolonged cough, headaches, hemoptysis, abdominal pain, melena, hematochezia, severe indigestion/heartburn, hematuria, incontinence, genital sores, muscle weakness, suspicious skin  lesions, transient blindness, difficulty walking, depression, unusual weight change, abnormal bleeding, enlarged lymph nodes, angioedema, and breast masses.         LBP  Physical Exam  General:  alert, well-developed, well-nourished, well-hydrated, appropriate dress, normal appearance, healthy-appearing, and underweight appearing.   Head:  normocephalic, atraumatic, no abnormalities observed, and no abnormalities palpated.   Eyes:  vision grossly intact and no injection.   Ears:  R ear normal and L ear normal.   Nose:  External nasal examination shows no deformity or inflammation. Nasal mucosa are pink and moist without lesions or exudates. Mouth:  good dentition, no gingival abnormalities, pharynx pink and moist, no erythema, no exudates, no posterior lymphoid hypertrophy, no postnasal drip, no pharyngeal crowing, no lesions, no aphthous ulcers, no erosions, no leukoplakia, and no petechiae.  Eryth. throat Neck:  supple, full ROM, no thyromegaly, normal carotid upstroke, no carotid bruits, and cervical lymphadenopathy L>R.   Lungs:  normal respiratory effort, no intercostal retractions, no accessory muscle use, normal breath sounds, no dullness, no fremitus, no crackles, and no wheezes.   Heart:  normal rate, regular rhythm, no murmur, no gallop, no rub, and no JVD.   Abdomen:  soft, non-tender, normal bowel sounds, no distention, no masses, no guarding, no rigidity, no rebound tenderness, no abdominal hernia, no inguinal hernia, no hepatomegaly, and no splenomegaly.   Msk:  No deformity or scoliosis noted of thoracic or lumbar spine.  Lumbar-sacral spine is tender to palpation over paraspinal muscles and painfull with the ROM  Pulses:  R and L carotid,radial,femoral,dorsalis pedis and posterior tibial pulses are full and equal bilaterally Extremities:  No clubbing, cyanosis, edema, or deformity noted with normal full range of motion of all joints.   Neurologic:  No cranial nerve deficits noted.  Station and gait are normal. Plantar reflexes are down-going bilaterally. DTRs are symmetrical throughout. Sensory, motor and coordinative functions appear intact. Skin:  Intact without suspicious lesions or rashes Psych:  Cognition and judgment appear intact. Alert and cooperative with normal attention span and concentration. No apparent delusions, illusions, hallucinations   Impression & Recommendations:  Problem # 1:  HEALTH MAINTENANCE EXAM (ICD-V70.0) Assessment New I have personally reviewed the Medicare Annual Wellness questionnaire and have noted 1.   The patient's medical and social history 2.   Their use of alcohol, tobacco or illicit drugs 3.   Their current medications and supplements 4.   The patient's functional ability including ADL's, fall risks, home safety risks and hearing or visual             impairment. 5.   Diet and physical activities 6.   Evidence for depression or mood disorders The patients weight, height, BMI and visual acuity have been recorded in the chart I have made referrals, counseling and provided education to the patient based review of the above and I have provided the  pt with a written personalized care plan for preventive services.   Orders: TLB-BMP (Basic Metabolic Panel-BMET) (80048-METABOL) TLB-CBC Platelet - w/Differential (85025-CBCD) TLB-Hepatic/Liver Function Pnl (80076-HEPATIC) TLB-TSH (Thyroid Stimulating Hormone) (84443-TSH) TLB-Udip ONLY (81003-UDIP) TLB-Lipid Panel (80061-LIPID) Gastroenterology Referral (GI) Medicare -1st Annual Wellness Visit [phone])  Problem # 2:  HYPOTHYROIDISM, POST-RADIATION (ICD-244.1) Assessment: Comment Only On the regimen of medicine(s) reflected in the chart    Problem # 3:  HYPOTHYROIDISM (ICD-244.9) Assessment: Unchanged On the regimen of medicine(s) reflected in the chart    Problem # 4:  COPD (ICD-496) Assessment: Unchanged  Her updated medication list for this problem includes:    Advair Hfa  45-21 Mcg/act Aero (Fluticasone-salmeterol) .[name]... 1 inh bid  Problem # 5:  DEPRESSION (ICD-311) Assessment: Comment Only  Problem # 6:  UPPER RESPIRATORY INFECTION, ACUTE (ICD-465.9) Assessment: New Zpac if worse    Tessalon Perles 100 Mg Caps (Benzonatate) .[name]... 1-2 by mouth two times a day as needed cogh  Complete Medication List: 1)  Oxycontin 80 Mg Tb12 (Oxycodone hcl) .... Take 1 qid by mouth once daily please,  fill on or after 09/25/10 2)  Oxycontin 40 Mg Tb12 (Oxycodone hcl) .[name].. 1 by mouth qid.  please,  fill this rx on or after 07/27/11 3)  Lasix 40 Mg Tabs (Furosemide) .... Take 1 by mouth qd 4)  Klor-con 20 Meq Pack (Potassium chloride) .... Take 1 by mouth qd 5)  Vitamin D3 1000 Unit Tabs (Cholecalciferol) .[name].. 1 by mouth daily 6)  Advair Hfa 45-21 Mcg/act Aero (Fluticasone-salmeterol) .[name].. 1 inh bid 7)  Aspirin 81 Mg Tbec (Aspirin) .[name].. 1 once daily 8)  Coenzyme Q10 10 Mg Caps (Coenzyme q10) 9)  Calcium Citrate + Tabs (Multiple minerals-vitamins) .[name].. 1 once daily 10)  Nasacort Aq 55 Mcg/act Aers (Triamcinolone acetonide(nasal)) .[name].. 1 spray in each nostril daily for rhinitis 11)  Zithromax Z-pak 250 Mg Tabs (Azithromycin) .... As dirrected 12)  Tessalon Perles 100 Mg Caps (Benzonatate) .[name].. 1-2 by mouth two times a day as needed cogh  Other Orders: EKG w/ Interpretation (93000)  Patient Instructions: 1)  Please schedule a follow-up appointment in 2 months. Prescriptions: TESSALON PERLES 100 MG CAPS (BENZONATATE) 1-2 by mouth two times a day as needed cogh  #120 x 1   Entered and Authorized by:   Tresa Garter MD   Signed by:   Tresa Garter MD on 08/14/2010   Method used:   Print then Give to Patient   RxID:   [id number] ZITHROMAX Z-PAK 250 MG TABS (AZITHROMYCIN) as dirrected  #1 x 0   Entered and Authorized by:   Tresa Garter MD   Signed by:   Tresa Garter MD on 08/14/2010   Method used:   Print then Give to Patient   RxID:    [id number] OXYCONTIN 40 MG  TB12 (OXYCODONE HCL) 1 by mouth qid.  Please,  fill this rx on or after 07/27/11 Brand medically necessary #120 x 0   Entered and Authorized by:   Tresa Garter MD   Signed by:   Tresa Garter MD on 08/13/2010   Method used:   Print then Give to Patient   RxID:   [id number] OXYCONTIN 80 MG  TB12 (OXYCODONE HCL) TAKE 1 QID by mouth once daily Please,  fill on or after 09/25/10 Brand medically necessary #120 x 0   Entered and Authorized by:   Tresa Garter MD   Signed by:   Georgina Quint Plotnikov  MD on 08/13/2010   Method used:   Print then Give to Patient   RxID:   [id number] OXYCONTIN 40 MG  TB12 (OXYCODONE HCL) 1 by mouth qid.  Please,  fill this rx on or after 08/25/10 Brand medically necessary #120 x 0   Entered and Authorized by:   Tresa Garter MD   Signed by:   Tresa Garter MD on 08/13/2010   Method used:   Print then Give to Patient   RxID:   [id number] OXYCONTIN 80 MG  TB12 (OXYCODONE HCL) TAKE 1 QID by mouth once daily Please,  fill on or after 08/25/10 Brand medically necessary #120 x 0   Entered and Authorized by:   Tresa Garter MD   Signed by:   Tresa Garter MD on 08/13/2010   Method used:   Print then Give to Patient   RxID:   [id number]    Orders Added: 1)  EKG w/ Interpretation [93000] 2)  TLB-BMP (Basic Metabolic Panel-BMET) [80048-METABOL] 3)  TLB-CBC Platelet - w/Differential [85025-CBCD] 4)  TLB-Hepatic/Liver Function Pnl [80076-HEPATIC] 5)  TLB-TSH (Thyroid Stimulating Hormone) [84443-TSH] 6)  TLB-Udip ONLY [81003-UDIP] 7)  TLB-Lipid Panel [80061-LIPID] 8)  Gastroenterology Referral [GI] 9)  Medicare -1st Annual Wellness Visit [G0438] 10)  Est. Patient Level IV [03474]   Immunization History:  Tetanus/Td Immunization History:    Tetanus/Td:  historical (04/28/2002)   Immunization History:  Tetanus/Td Immunization History:    Tetanus/Td:  Historical  (04/28/2002)

## 2010-10-17 NOTE — Assessment & Plan Note (Signed)
Summary: sore throat-lb   Vital Signs:  Patient profile:   50 year old female Height:      66 inches (167.64 cm) Weight:      126.13 pounds (57.33 kg) BMI:     20.43 O2 Sat:      97 % on Room air Temp:     97.5 degrees F (36.39 degrees C) oral Pulse rate:   68 / minute BP sitting:   102 / 64  (left arm) Cuff size:   regular  Vitals Entered By: Brenton Grills MA (March 19, 2010 4:20 PM)  O2 Flow:  Room air CC: sore throat/aj   Primary Provider:  Tresa Garter MD  CC:  sore throat/aj.  History of Present Illness: the status of at least 3 ongoing medical problems is addressed today: uri:  since she was here last week, she says she is "50% better."  she still has pain at left ear, and left side of the throat.   she had i-131 rx for hyperthyroidism in 2007: she has not required any synthroid since then.  she has fatigue, but this has just been since the onset of her current illness. multinodular goiter: was also noted in 2007 then.  she says the thyroid is enlarged.  Current Medications (verified): 1)  Oxycontin 80 Mg  Tb12 (Oxycodone Hcl) .... Take 1 Qid By Mouth Once Daily Please,  Fill On or After 03/27/10 2)  Oxycontin 40 Mg  Tb12 (Oxycodone Hcl) .[name].. 1 By Mouth Qid.  Please,  Fill This Rx On or After 03/27/10 3)  Lasix 40 Mg  Tabs (Furosemide) .... Take 1 By Mouth Qd 4)  Klor-Con 20 Meq  Pack (Potassium Chloride) .... Take 1 By Mouth Qd 5)  Vitamin D3 1000 Unit  Tabs (Cholecalciferol) .[name].. 1 By Mouth Daily 6)  Advair Hfa 45-21 Mcg/act Aero (Fluticasone-Salmeterol) .[name].. 1 Inh Bid 7)  Chantix Continuing Month Pak 1 Mg Tabs (Varenicline Tartrate) .... Use Asd 1 By Mouth Once Daily 8)  Aspirin 81 Mg Tbec (Aspirin) .[name].. 1 Once Daily 9)  Coenzyme Q10 60 Mg Tabs (Coenzyme Q10) .[name].. 1 Once Daily 10)  Calcium Carbonate 600 Mg Tabs (Calcium Carbonate) .[name].. 1 Once Daily 11)  Ceftin 500 Mg Tab (Cefuroxime Axetil) .... Take One (1) Tablet By Mouth Two (2) Times A Day X 10  Days  Allergies (verified): 1)  ! Nsaids  Past History:  Past Medical History: Last updated: 08/22/2008 Osteoporosis Hepatitis C (2001) Spinal OA Menopause Mild Asthmatic Bronchitis Low back pain Osteoarthritis Depression Hyperthyroidism, s/p 131Iodine Rx 2007 Hypothyroidism  Review of Systems  The patient denies fever and prolonged cough.    Physical Exam  General:  normal appearance.   Head:  head: no deformity eyes: no periorbital swelling, no proptosis external nose and ears are normal mouth: no lesion seen Ears:  TM's intact and clear with normal canals with grossly normal hearing.   Neck:  Supple without thyroid enlargement or tenderness.  Lungs:  Clear to auscultation bilaterally. Normal respiratory effort.  Cervical Nodes:  few small right anterior cervical nodes Additional Exam:   FastTSH              [H]  5.70 uIU/mL    THYROID ULTRASOUND Small right thyroid nodules with the largest measuring 9 mm as noted above.   Impression & Recommendations:  Problem # 1:  uri improved  Problem # 2:  GOITER, MULTINODULAR (ICD-241.1) Assessment: Improved  Problem # 3:  HYPOTHYROIDISM, POST-RADIATION (  ICD-244.1) mild  Other Orders: TLB-TSH (Thyroid Stimulating Hormone) (04540-JWJ) Radiology Referral (Radiology) Est. Patient Level IV (19147)  Patient Instructions: 1)  please finish cefuroxime 2)  recheck thyroid blood test.   3)  recheck thyroid ultrasound.  you will be called with a day and time for an appointment 4)  please call [phone] to hear each of your test results. 5)  (update: i left message on phone-tree:  i offered synthroid, but no need at this point, unless [place] is significantly abnormal) 6)  (update: i left message on phone-tree:  ultrasond is better overall, but please ret 1 year, as a repeat ultrasound will be needed).

## 2010-10-17 NOTE — Assessment & Plan Note (Signed)
Summary: SORE THROAT/ AVP'S PT /NWS   Vital Signs:  Patient profile:   50 year old female Height:      66 inches Weight:      126 pounds BMI:     20.41 O2 Sat:      98 % on Room air Temp:     98.6 degrees F oral Pulse rate:   76 / minute Pulse rhythm:   regular Resp:     16 per minute BP sitting:   100 / 60  (left arm) Cuff size:   regular  Vitals Entered By: Lanier Prude, CMA(AAMA) (March 15, 2010 2:54 PM)  O2 Flow:  Room air CC: sore throat, Lt ear pain X 1 wk, URI symptoms   Primary Care Provider:  Tresa Garter MD  CC:  sore throat, Lt ear pain X 1 wk, and URI symptoms.  History of Present Illness:  URI Symptoms      This is a 50 year old woman who presents with URI symptoms.  The symptoms began 1 week ago.  The severity is described as moderate.  The patient reports sore throat, but denies nasal congestion, clear nasal discharge, purulent nasal discharge, dry cough, productive cough, earache, and sick contacts.  The patient denies fever, stiff neck, dyspnea, wheezing, rash, vomiting, diarrhea, use of an antipyretic, and response to antipyretic.  The patient denies headache, muscle aches, and severe fatigue.  Risk factors for Strep sinusitis include tender adenopathy and absence of cough.  The patient denies the following risk factors for Strep sinusitis: unilateral facial pain, unilateral nasal discharge, double sickening, and Strep exposure.    Preventive Screening-Counseling & Management  Alcohol-Tobacco     Alcohol drinks/day: 0     Smoking Status: quit     Smoking Cessation Counseling: yes     Tobacco Counseling: to remain off tobacco products  Medications Prior to Update: 1)  Oxycontin 80 Mg  Tb12 (Oxycodone Hcl) .... Take 1 Qid By Mouth Once Daily Please,  Fill On or After 03/27/10 2)  Oxycontin 40 Mg  Tb12 (Oxycodone Hcl) .[name].. 1 By Mouth Qid.  Please,  Fill This Rx On or After 03/27/10 3)  Lasix 40 Mg  Tabs (Furosemide) .... Take 1 By Mouth Qd 4)   Klor-Con 20 Meq  Pack (Potassium Chloride) .... Take 1 By Mouth Qd 5)  Vitamin D3 1000 Unit  Tabs (Cholecalciferol) .[name].. 1 By Mouth Daily 6)  Advair Hfa 45-21 Mcg/act Aero (Fluticasone-Salmeterol) .[name].. 1 Inh Bid 7)  Chantix Continuing Month Pak 1 Mg Tabs (Varenicline Tartrate) .... Use Asd 1 By Mouth Once Daily  Current Medications (verified): 1)  Oxycontin 80 Mg  Tb12 (Oxycodone Hcl) .... Take 1 Qid By Mouth Once Daily Please,  Fill On or After 03/27/10 2)  Oxycontin 40 Mg  Tb12 (Oxycodone Hcl) .[name].. 1 By Mouth Qid.  Please,  Fill This Rx On or After 03/27/10 3)  Lasix 40 Mg  Tabs (Furosemide) .... Take 1 By Mouth Qd 4)  Klor-Con 20 Meq  Pack (Potassium Chloride) .... Take 1 By Mouth Qd 5)  Vitamin D3 1000 Unit  Tabs (Cholecalciferol) .[name].. 1 By Mouth Daily 6)  Advair Hfa 45-21 Mcg/act Aero (Fluticasone-Salmeterol) .[name].. 1 Inh Bid 7)  Chantix Continuing Month Pak 1 Mg Tabs (Varenicline Tartrate) .... Use Asd 1 By Mouth Once Daily 8)  Aspirin 81 Mg Tbec (Aspirin) .[name].. 1 Once Daily 9)  Coenzyme Q10 60 Mg Tabs (Coenzyme Q10) .[name].. 1 Once Daily 10)  Calcium Carbonate 600 Mg Tabs (Calcium Carbonate) .[name].. 1 Once Daily 11)  Ceftin 500 Mg Tab (Cefuroxime Axetil) .... Take One (1) Tablet By Mouth Two (2) Times A Day X 10 Days  Allergies (verified): 1)  ! Nsaids  Past History:  Past Medical History: Last updated: 08/22/2008 Osteoporosis Hepatitis C (2001) Spinal OA Menopause Mild Asthmatic Bronchitis Low back pain Osteoarthritis Depression Hyperthyroidism, s/p 131Iodine Rx 2007 Hypothyroidism  Past Surgical History: Last updated: 03/27/2007 PFT (11/07/2005) EKG (08/22/2005)  Family History: Last updated: 03/27/2007 Family History Hypertension  Social History: Last updated: 03/15/2010 Married Current Smoker Alcohol use-no Retired  Risk Factors: Alcohol Use: 0 (03/15/2010)  Risk Factors: Smoking Status: quit (03/15/2010)  Family History: Reviewed history from 03/27/2007 and  no changes required. Family History Hypertension  Social History: Reviewed history from 03/27/2007 and no changes required. Married Current Smoker Alcohol use-no Retired  Review of Systems       The patient complains of enlarged lymph nodes.  The patient denies anorexia, fever, weight loss, decreased hearing, hoarseness, prolonged cough, headaches, hemoptysis, abdominal pain, suspicious skin lesions, and angioedema.    Physical Exam  General:  alert, well-developed, well-nourished, well-hydrated, appropriate dress, normal appearance, healthy-appearing, and underweight appearing.   Head:  normocephalic, atraumatic, no abnormalities observed, and no abnormalities palpated.   Eyes:  vision grossly intact and no injection.   Ears:  R ear normal and L ear normal.   Nose:  External nasal examination shows no deformity or inflammation. Nasal mucosa are pink and moist without lesions or exudates. Mouth:  good dentition, no gingival abnormalities, pharynx pink and moist, no erythema, no exudates, no posterior lymphoid hypertrophy, no postnasal drip, no pharyngeal crowing, no lesions, no aphthous ulcers, no erosions, no leukoplakia, and no petechiae.   Neck:  supple, full ROM, no thyromegaly, normal carotid upstroke, no carotid bruits, and cervical lymphadenopathy L>R.   Lungs:  normal respiratory effort, no intercostal retractions, no accessory muscle use, normal breath sounds, no dullness, no fremitus, no crackles, and no wheezes.   Heart:  normal rate, regular rhythm, no murmur, no gallop, no rub, and no JVD.   Abdomen:  soft, non-tender, normal bowel sounds, no distention, no masses, no guarding, no rigidity, no rebound tenderness, no abdominal hernia, no inguinal hernia, no hepatomegaly, and no splenomegaly.   Msk:  No deformity or scoliosis noted of thoracic or lumbar spine.   Pulses:  R and L carotid,radial,femoral,dorsalis pedis and posterior tibial pulses are full and equal  bilaterally Extremities:  No clubbing, cyanosis, edema, or deformity noted with normal full range of motion of all joints.   Neurologic:  No cranial nerve deficits noted. Station and gait are normal. Plantar reflexes are down-going bilaterally. DTRs are symmetrical throughout. Sensory, motor and coordinative functions appear intact. Skin:  turgor normal, color normal, no rashes, no suspicious lesions, no ecchymoses, no petechiae, no purpura, no ulcerations, and no edema.   Axillary Nodes:  no R axillary adenopathy and no L axillary adenopathy.   Inguinal Nodes:  no R inguinal adenopathy and no L inguinal adenopathy.   Psych:  Cognition and judgment appear intact. Alert and cooperative with normal attention span and concentration. No apparent delusions, illusions, hallucinations   Impression & Recommendations:  Problem # 1:  ACUTE LYMPHADENITIS (ICD-683) Assessment New  If the LAD does not resolve with antibiotics in 2 weeks then she'll need a further work-up with labs, CT scan, biopsy, etc. Her updated medication list for this problem includes:    Ceftin  500 Mg Tab (Cefuroxime axetil) .[name]... Take one (1) tablet by mouth two (2) times a day x 10 days  Warm, moist compresses and symptomatic measures. Advised patient to complete all antibiotics.  Complete Medication List: 1)  Oxycontin 80 Mg Tb12 (Oxycodone hcl) .... Take 1 qid by mouth once daily please,  fill on or after 03/27/10 2)  Oxycontin 40 Mg Tb12 (Oxycodone hcl) .[name].. 1 by mouth qid.  please,  fill this rx on or after 03/27/10 3)  Lasix 40 Mg Tabs (Furosemide) .... Take 1 by mouth qd 4)  Klor-con 20 Meq Pack (Potassium chloride) .... Take 1 by mouth qd 5)  Vitamin D3 1000 Unit Tabs (Cholecalciferol) .[name].. 1 by mouth daily 6)  Advair Hfa 45-21 Mcg/act Aero (Fluticasone-salmeterol) .[name].. 1 inh bid 7)  Chantix Continuing Month Pak 1 Mg Tabs (Varenicline tartrate) .... Use asd 1 by mouth once daily 8)  Aspirin 81 Mg Tbec (Aspirin) .[name].. 1  once daily 9)  Coenzyme Q10 60 Mg Tabs (Coenzyme q10) .[name].. 1 once daily 10)  Calcium Carbonate 600 Mg Tabs (Calcium carbonate) .[name].. 1 once daily 11)  Ceftin 500 Mg Tab (Cefuroxime axetil) .... Take one (1) tablet by mouth two (2) times a day x 10 days  Patient Instructions: 1)  Please schedule a follow-up appointment in 2 weeks. 2)  Take your antibiotic as prescribed until ALL of it is gone, but stop if you develop a rash or swelling and contact our office as soon as possible. Prescriptions: CEFTIN 500 MG TAB (CEFUROXIME AXETIL) Take one (1) tablet by mouth two (2) times a day X 10 days  #20 x 0   Entered and Authorized by:   Etta Grandchild MD   Signed by:   Etta Grandchild MD on 03/15/2010   Method used:   Electronically to        [facility]* (retail)       [address]       [place], [place]  [address]       Ph: [phone] or [phone]       Fax: [phone]   RxID:   [id number]

## 2010-10-17 NOTE — Progress Notes (Signed)
Summary: CHANTIX - Plot pt  Phone Note Call from Patient Call back at Home Phone [phone]   Summary of Call: Patient is requesting to try chantix again. [facility]) Initial call taken by: Lamar Sprinkles, CMA,  November 26, 2009 3:49 PM  Follow-up for Phone Call        done escript. Follow-up by: Corwin Levins MD,  November 26, 2009 5:48 PM  Additional Follow-up for Phone Call Additional follow up Details #1::        Pt informed  Additional Follow-up by: Lamar Sprinkles, CMA,  November 26, 2009 6:06 PM    New/Updated Medications: CHANTIX STARTING MONTH PAK 0.5 MG X 11 & 1 MG X 42 TABS (VARENICLINE TARTRATE) use asd 1 by mouth once daily CHANTIX CONTINUING MONTH PAK 1 MG TABS (VARENICLINE TARTRATE) use asd 1 by mouth once daily Prescriptions: CHANTIX CONTINUING MONTH PAK 1 MG TABS (VARENICLINE TARTRATE) use asd 1 by mouth once daily  #30 x 1   Entered and Authorized by:   Corwin Levins MD   Signed by:   Corwin Levins MD on 11/26/2009   Method used:   Electronically to        [facility]* (retail)       [address] [place], [place]  [address]       Ph: [phone] or [phone]       Fax: [phone]   RxID:   [id number] CHANTIX STARTING MONTH PAK 0.5 MG X 11 & 1 MG X 42 TABS (VARENICLINE TARTRATE) use asd 1 by mouth once daily  #30 x 0   Entered and Authorized by:   Corwin Levins MD   Signed by:   Corwin Levins MD on 11/26/2009   Method used:   Electronically to        [facility]* (retail)       [address]       [place], [place]  [address]       Ph: [phone] or [phone]       Fax: [phone]   RxID:   [id number]

## 2010-10-17 NOTE — Progress Notes (Signed)
Summary: REFILL  Phone Note Refill Request Call back at Home Phone [phone]   Refills Requested: Medication #1:  OXYCONTIN 80 MG  TB12 TAKE 1 QID by mouth once daily Please  Medication #2:  OXYCONTIN 40 MG  TB12 1 by mouth qid.  Please Due to fill on the 14th, OK?   Initial call taken by: Lamar Sprinkles, CMA,  October 22, 2009 5:20 PM  Follow-up for Phone Call        OK to ref Follow-up by: Tresa Garter MD,  October 23, 2009 7:33 AM  Additional Follow-up for Phone Call Additional follow up Details #1::        Pt informed to pick up Additional Follow-up by: Lamar Sprinkles, CMA,  October 23, 2009 8:29 AM    New/Updated Medications: OXYCONTIN 80 MG  TB12 (OXYCODONE HCL) TAKE 1 QID by mouth once daily Please,  fill on or after 10/29/09 [BMN] OXYCONTIN 40 MG  TB12 (OXYCODONE HCL) 1 by mouth qid.  Please,  fill on or after 10/29/09 [BMN] Prescriptions: OXYCONTIN 40 MG  TB12 (OXYCODONE HCL) 1 by mouth qid.  Please,  fill on or after 10/29/09 Brand medically necessary #120 x 0   Entered by:   Lamar Sprinkles, CMA   Authorized by:   Tresa Garter MD   Signed by:   Lamar Sprinkles, CMA on 10/23/2009   Method used:   Print then Give to Patient   RxID:   [id number] OXYCONTIN 80 MG  TB12 (OXYCODONE HCL) TAKE 1 QID by mouth once daily Please,  fill on or after 10/29/09 Brand medically necessary #120 x 0   Entered by:   Lamar Sprinkles, CMA   Authorized by:   Tresa Garter MD   Signed by:   Lamar Sprinkles, CMA on 10/23/2009   Method used:   Print then Give to Patient   RxID:   [id number]

## 2010-10-17 NOTE — Progress Notes (Signed)
Summary: REFILL  Phone Note Refill Request   Refills Requested: Medication #1:  OXYCONTIN 80 MG  TB12 TAKE 1 QID by mouth once daily Please  Medication #2:  OXYCONTIN 40 MG  TB12 1 by mouth qid.  Please Initial call taken by: Lamar Sprinkles, CMA,  Jan 17, 2010 9:34 AM  Follow-up for Phone Call        ok to ref Follow-up by: Tresa Garter MD,  Jan 17, 2010 2:39 PM  Additional Follow-up for Phone Call Additional follow up Details #1::        OK per MD to prepare 3 mths of refills. Pt informed that it is their responsibity to keep up with rx's. If lost we are not able to replace rx's.  Pt uses [facility] who may be able to keep future rx's on file. Will print rx's Monday when MD returns.  Additional Follow-up by: Lamar Sprinkles, CMA,  Jan 17, 2010 4:56 PM    Additional Follow-up for Phone Call Additional follow up Details #2::    Pt informed rx's ready today Follow-up by: Lamar Sprinkles, CMA,  Jan 22, 2010 10:25 AM  New/Updated Medications: OXYCONTIN 80 MG  TB12 (OXYCODONE HCL) TAKE 1 QID by mouth once daily Please,  fill on or after 01/25/10 [BMN] OXYCONTIN 80 MG  TB12 (OXYCODONE HCL) TAKE 1 QID by mouth once daily Please,  fill on or after 02/25/10 [BMN] OXYCONTIN 80 MG  TB12 (OXYCODONE HCL) TAKE 1 QID by mouth once daily Please,  fill on or after 03/27/10 [BMN] OXYCONTIN 40 MG  TB12 (OXYCODONE HCL) 1 by mouth qid.  Please,  fill this rx on or after 01/25/10 [BMN] OXYCONTIN 40 MG  TB12 (OXYCODONE HCL) 1 by mouth qid.  Please,  fill this rx on or after 02/25/10 [BMN] OXYCONTIN 40 MG  TB12 (OXYCODONE HCL) 1 by mouth qid.  Please,  fill this rx on or after 03/27/10 [BMN] Prescriptions: OXYCONTIN 40 MG  TB12 (OXYCODONE HCL) 1 by mouth qid.  Please,  fill this rx on or after 03/27/10 Brand medically necessary #120 x 0   Entered by:   Lamar Sprinkles, CMA   Authorized by:   Tresa Garter MD   Signed by:   Lamar Sprinkles, CMA on 01/22/2010   Method used:   Print then Give  to Patient   RxID:   [id number] OXYCONTIN 80 MG  TB12 (OXYCODONE HCL) TAKE 1 QID by mouth once daily Please,  fill on or after 03/27/10 Brand medically necessary #120 x 0   Entered by:   Lamar Sprinkles, CMA   Authorized by:   Tresa Garter MD   Signed by:   Lamar Sprinkles, CMA on 01/22/2010   Method used:   Print then Give to Patient   RxID:   [id number] OXYCONTIN 40 MG  TB12 (OXYCODONE HCL) 1 by mouth qid.  Please,  fill this rx on or after 02/25/10 Brand medically necessary #120 x 0   Entered by:   Lamar Sprinkles, CMA   Authorized by:   Tresa Garter MD   Signed by:   Lamar Sprinkles, CMA on 01/22/2010   Method used:   Print then Give to Patient   RxID:   [id number] OXYCONTIN 80 MG  TB12 (OXYCODONE HCL) TAKE 1 QID by mouth once daily Please,  fill on or after 02/25/10 Brand medically necessary #120 x 0   Entered by:   Lamar Sprinkles, CMA   Authorized by:  Tresa Garter MD   Signed by:   Lamar Sprinkles, CMA on 01/22/2010   Method used:   Print then Give to Patient   RxID:   [id number] OXYCONTIN 40 MG  TB12 (OXYCODONE HCL) 1 by mouth qid.  Please,  fill this rx on or after 01/25/10 Brand medically necessary #120 x 0   Entered by:   Lamar Sprinkles, CMA   Authorized by:   Tresa Garter MD   Signed by:   Lamar Sprinkles, CMA on 01/22/2010   Method used:   Print then Give to Patient   RxID:   [id number] OXYCONTIN 80 MG  TB12 (OXYCODONE HCL) TAKE 1 QID by mouth once daily Please,  fill on or after 01/25/10 Brand medically necessary #120 x 0   Entered by:   Lamar Sprinkles, CMA   Authorized by:   Tresa Garter MD   Signed by:   Lamar Sprinkles, CMA on 01/22/2010   Method used:   Print then Give to Patient   RxID:   [id number]

## 2010-10-28 ENCOUNTER — Other Ambulatory Visit: Payer: Self-pay | Admitting: Gastroenterology

## 2010-10-31 ENCOUNTER — Encounter (INDEPENDENT_AMBULATORY_CARE_PROVIDER_SITE_OTHER): Payer: Self-pay | Admitting: *Deleted

## 2010-11-04 ENCOUNTER — Encounter: Payer: Self-pay | Admitting: Gastroenterology

## 2010-11-12 ENCOUNTER — Other Ambulatory Visit (AMBULATORY_SURGERY_CENTER): Payer: Medicare Other | Admitting: Gastroenterology

## 2010-11-12 ENCOUNTER — Other Ambulatory Visit: Payer: Self-pay | Admitting: Gastroenterology

## 2010-11-12 DIAGNOSIS — D126 Benign neoplasm of colon, unspecified: Secondary | ICD-10-CM

## 2010-11-12 DIAGNOSIS — Z1211 Encounter for screening for malignant neoplasm of colon: Secondary | ICD-10-CM

## 2010-11-12 DIAGNOSIS — K5520 Angiodysplasia of colon without hemorrhage: Secondary | ICD-10-CM

## 2010-11-12 NOTE — Miscellaneous (Signed)
Summary: [facility] Previsit/prep  Clinical Lists Changes  Medications: Added new medication of MOVIPREP 100 GM  SOLR (PEG-KCL-NACL-NASULF-NA ASC-C) As per prep instructions. - Signed Rx of MOVIPREP 100 GM  SOLR (PEG-KCL-NACL-NASULF-NA ASC-C) As per prep instructions.;  #1 x 0;  Signed;  Entered by: Wyona Almas RN;  Authorized by: Meryl Dare MD [facility];  Method used: [facility]  [place] [address] [address], [place], [place]  [address], Ph: [phone] or [phone], Fax: [phone] Allergies: Changed allergy or adverse reaction from NSAIDS to NSAIDS Added new allergy or adverse reaction of TRAZODONE HCL    Prescriptions: MOVIPREP 100 GM  SOLR (PEG-KCL-NACL-NASULF-NA ASC-C) As per prep instructions.  #1 x 0   Entered by:   Wyona Almas RN   Authorized by:   Meryl Dare MD [facility]   Signed by:   Wyona Almas RN on 11/04/2010   Method used:   [facility]        [facility]  [place] [address] (retail)       [address]       [place], [place]  [address]       Ph: [phone] or [phone]       Fax: [phone]   RxID:   [id number]

## 2010-11-12 NOTE — Letter (Signed)
Summary: Moviprep Instructions  [facility]  [address]   [place], [place] [address]   Phone: [phone]  Fax: [phone]       [facility]    1960/12/12    MRN: [id number]        Procedure Day Dorna Bloom: Jake Shark  11/12/10     Arrival Time: 8:00 a.m.     Procedure Time: 9:00 a.m.     Location of Procedure:                    x [facility] (4th Floor)   PREPARATION FOR COLONOSCOPY WITH MOVIPREP   Starting 5 days prior to your procedure 11-07-10 do not eat nuts, seeds, popcorn, corn, beans, peas,  salads, or any raw vegetables.  Do not take any fiber supplements (e.g. Metamucil, Citrucel, and Benefiber).  THE DAY BEFORE YOUR PROCEDURE         DATE: 11-11-10 DAY: Monday  1.  Drink clear liquids the entire day-NO SOLID FOOD  2.  Do not drink anything colored red or purple.  Avoid juices with pulp.  No orange juice.  3.  Drink at least 64 oz. (8 glasses) of fluid/clear liquids during the day to prevent dehydration and help the prep work efficiently.  CLEAR LIQUIDS INCLUDE: Water Jello Ice Popsicles Tea (sugar ok, no milk/cream) Powdered fruit flavored drinks Coffee (sugar ok, no milk/cream) Gatorade Juice: apple, white grape, white cranberry  Lemonade Clear bullion, consomm, broth Carbonated beverages (any kind) Strained chicken noodle soup Hard Candy                             4.  In the morning, mix first dose of MoviPrep solution:    Empty 1 Pouch A and 1 Pouch B into the disposable container    Add lukewarm drinking water to the top line of the container. Mix to dissolve    Refrigerate (mixed solution should be used within 24 hrs)  5.  Begin drinking the prep at 5:00 p.m. The MoviPrep container is divided by 4 marks.   Every 15 minutes drink the solution down to the next mark (approximately 8 oz) until the full liter is complete.   6.  Follow completed prep with 16 oz of clear liquid of your choice (Nothing red or purple).   Continue to drink clear liquids until bedtime.  7.  Before going to bed, mix second dose of MoviPrep solution:    Empty 1 Pouch A and 1 Pouch B into the disposable container    Add lukewarm drinking water to the top line of the container. Mix to dissolve    Refrigerate  THE DAY OF YOUR PROCEDURE      DATE: 11-12-10 DAY: Tuesday  Beginning at 4:00 a.m. (5 hours before procedure):         1. Every 15 minutes, drink the solution down to the next mark (approx 8 oz) until the full liter is complete.  2. Follow completed prep with 16 oz. of clear liquid of your choice.    3. You may drink clear liquids until 7:00 a.m. (2 HOURS BEFORE PROCEDURE).   MEDICATION INSTRUCTIONS  Unless otherwise instructed, you should take regular prescription medications with a small sip of water   as early as possible the morning of your procedure.   Additional medication instructions:  Hold Lasix, Potassium and Oxycodone the morning of procedure.  OTHER INSTRUCTIONS  You will need a responsible adult at least 51 years of age to accompany you and drive you home.   This person must remain in the waiting room during your procedure.  Wear loose fitting clothing that is easily removed.  Leave jewelry and other valuables at home.  However, you may wish to bring a book to read or  an iPod/MP3 player to listen to music as you wait for your procedure to start.  Remove all body piercing jewelry and leave at home.  Total time from sign-in until discharge is approximately 2-3 hours.  You should go home directly after your procedure and rest.  You can resume normal activities the  day after your procedure.  The day of your procedure you should not:   Drive   Make legal decisions   Operate machinery   Drink alcohol   Return to work  You will receive specific instructions about eating, activities and medications before you leave.    The above instructions have been reviewed and  explained to me by   Wyona Almas RN  November 04, 2010 2:06 PM     I fully understand and can verbalize these instructions _____________________________ Date _________

## 2010-11-18 ENCOUNTER — Other Ambulatory Visit: Payer: Self-pay | Admitting: Gastroenterology

## 2010-11-19 ENCOUNTER — Encounter: Payer: Self-pay | Admitting: Gastroenterology

## 2010-11-21 NOTE — Procedures (Addendum)
Summary: Colonoscopy  Patient: [name] Note: All result statuses are Final unless otherwise noted.  Tests: (1) Colonoscopy (COL)   COL Colonoscopy           DONE      [facility]     [address]     [place], [place]  [address]          COLONOSCOPY PROCEDURE REPORT     PATIENT:  Abigail Wilson, Abigail Wilson  MR#:  [id number]     BIRTHDATE:  08/23/1961, 49 yrs. old  GENDER:  female     ENDOSCOPIST:  Judie Petit T. Russella Dar, MD, [facility]     Referred by:  Linda Hedges. Plotnikov, M.D.     PROCEDURE DATE:  11/12/2010     PROCEDURE:  Colonoscopy with biopsy     ASA CLASS:  Class II     INDICATIONS:  1) Routine Risk Screening     MEDICATIONS:   Fentanyl 75 mcg IV, Versed 9 mg IV, Benadryl 50 mg     IV     DESCRIPTION OF PROCEDURE:   After the risks benefits and     alternatives of the procedure were thoroughly explained, informed     consent was obtained.  Digital rectal exam was performed and     revealed no abnormalities.   The LB PCF-[id number] endoscope     was introduced through the anus and advanced to the cecum, which     was identified by both the appendix and ileocecal valve, without     limitations.  The quality of the prep was good, using MoviPrep.     The instrument was then slowly withdrawn as the colon was fully     examined.     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 It was non-bleedin and 3 mm in size. A sessile polyp was     found at the hepatic flexure. It was 5 mm in size. The polyp was     removed using cold biopsy forceps.  Melanosis coli was found     throughout the colon. Otherwise normal colonoscopy without other     polyps, masses, vascular ectasias, or inflammatory changes.     Retroflexed views in the rectum revealed no abnormalities.  The     time to cecum =  3.5  minutes. The scope was then withdrawn (time     =  10.67  min) from the patient and the procedure completed.          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     3) If the polyp is adenomatous (pre-cancerous), colonoscopy in 5     years. Otherwise follow colorectal cancer screening guidelines for     "routine risk" patients with colonoscopy in 10 years.          Venita Lick. Russella Dar, MD, [facility]          n.     eSIGNED:   Venita Lick. Analya Louissaint at 11/12/2010 09:36 AM  [place], [place], [id number]  Note: An exclamation mark (!) indicates a result that was not dispersed into the flowsheet. Document Creation Date: 11/12/2010 9:36 AM _______________________________________________________________________  (1) Order result status: Final Collection or observation date-time: 11/12/2010 09:29 Requested date-time:  Receipt date-time:  Reported date-time:  Referring Physician:   Ordering Physician: Claudette Head [phone]) Specimen Source:  Source: Launa Grill Order Number: [id number] Lab site:   Appended Document: Colonoscopy     Procedures Next Due Date:    Colonoscopy: 10/2015

## 2010-11-26 NOTE — Letter (Signed)
Summary: Patient Notice- Polyp Results  [facility]  [address] [place], [place] [address]   Phone: [phone]  Fax: [phone]        November 19, 2010 MRN: [id number]    [facility] [address] [place], [place]  [address]    Dear Ms. Slagter,  I am pleased to inform you that the colon polyp(s) removed during your recent colonoscopy was (were) found to be benign (no cancer detected) upon pathologic examination.  I recommend you have a repeat colonoscopy examination in 5 years to look for recurrent polyps, as having colon polyps increases your risk for having recurrent polyps or even colon cancer in the future.  Should you develop new or worsening symptoms of abdominal pain, bowel habit changes or bleeding from the rectum or bowels, please schedule an evaluation with either your primary care physician or with me.  Continue treatment plan as outlined the day of your exam.  Please call us if you are having persistent problems or have questions about your condition that have not been fully answered at this time.  Sincerely,  Meryl Dare MD [facility]  This letter has been electronically signed by your physician.  Appended Document: Patient Notice- Polyp Results letter mailed

## 2010-12-06 ENCOUNTER — Ambulatory Visit (INDEPENDENT_AMBULATORY_CARE_PROVIDER_SITE_OTHER): Payer: Medicare Other | Admitting: Internal Medicine

## 2010-12-06 ENCOUNTER — Ambulatory Visit: Payer: Self-pay | Admitting: Internal Medicine

## 2010-12-06 ENCOUNTER — Encounter: Payer: Self-pay | Admitting: Internal Medicine

## 2010-12-06 DIAGNOSIS — M545 Low back pain, unspecified: Secondary | ICD-10-CM

## 2010-12-06 DIAGNOSIS — E890 Postprocedural hypothyroidism: Secondary | ICD-10-CM

## 2010-12-06 DIAGNOSIS — F329 Major depressive disorder, single episode, unspecified: Secondary | ICD-10-CM

## 2010-12-06 DIAGNOSIS — F3289 Other specified depressive episodes: Secondary | ICD-10-CM

## 2010-12-06 DIAGNOSIS — M810 Age-related osteoporosis without current pathological fracture: Secondary | ICD-10-CM

## 2010-12-06 MED ORDER — OXYCODONE HCL 40 MG PO TB12: 40.0000 mg | ORAL_TABLET | Freq: Four times a day (QID) | ORAL | Status: DC | PRN

## 2010-12-06 MED ORDER — OXYCODONE HCL 80 MG PO TB12: 80.0000 mg | ORAL_TABLET | Freq: Four times a day (QID) | ORAL | Status: DC

## 2010-12-06 NOTE — Assessment & Plan Note (Signed)
On Rx. Meds refilled.

## 2010-12-06 NOTE — Patient Instructions (Addendum)
Please, schedule your appt in 2 months

## 2010-12-06 NOTE — Assessment & Plan Note (Signed)
Vit D and calcium

## 2010-12-06 NOTE — Assessment & Plan Note (Signed)
Doing well 

## 2010-12-06 NOTE — Assessment & Plan Note (Signed)
Cont Rx 

## 2010-12-06 NOTE — Progress Notes (Signed)
  Subjective:    Patient ID: Abigail Wilson, female    DOB: 1961-03-18, 50 y.o.   MRN: [id number]  HPI  She is here for LBP, OA, hypothyroidism and depression follow up visit   Review of Systems  Constitutional: Negative for chills.  HENT: Negative for congestion.   Eyes: Negative for pain.  Respiratory: Negative for cough and wheezing.   Cardiovascular: Negative for chest pain.  Genitourinary: Negative for dysuria.  Musculoskeletal: Positive for back pain.  Neurological: Negative for light-headedness.       Objective:   Physical Exam  Constitutional: She appears well-developed.  Eyes: Pupils are equal, round, and reactive to light.  Neck: No thyromegaly present.  Cardiovascular: Normal rate.   Pulmonary/Chest: Breath sounds normal. She has no rales.  Abdominal: She exhibits no mass.  Musculoskeletal:       Lumbar back: She exhibits tenderness and pain.       Back:          Assessment & Plan:  HYPOTHYROIDISM, POST-RADIATION Cont Rx  DEPRESSION Doing well  LOW BACK PAIN On Rx. Meds refilled.  OSTEOPOROSIS Vit D and calcium

## 2010-12-23 ENCOUNTER — Other Ambulatory Visit: Payer: Self-pay | Admitting: Internal Medicine

## 2010-12-31 LAB — COMPREHENSIVE METABOLIC PANEL
ALT: 18 U/L (ref 0–35)
AST: 28 U/L (ref 0–37)
Albumin: 3.3 g/dL — ABNORMAL LOW (ref 3.5–5.2)
CO2: 29 mEq/L (ref 19–32)
Calcium: 8.4 mg/dL (ref 8.4–10.5)
Chloride: 102 mEq/L (ref 96–112)
Creatinine, Ser: 0.72 mg/dL (ref 0.4–1.2)
GFR calc Af Amer: >60 mL/min (ref >60)
GFR calc non Af Amer: >60 mL/min (ref >60)
Sodium: 134 mEq/L — ABNORMAL LOW (ref 135–145)

## 2010-12-31 LAB — CBC
MCHC: 35.2 g/dL (ref 30.0–36.0)
MCV: 93.9 fL (ref 78.0–100.0)
Platelets: 143 10*3/uL — ABNORMAL LOW (ref 150–400)
RBC: 3.81 MIL/uL — ABNORMAL LOW (ref 3.87–5.11)
WBC: 3.6 10*3/uL — ABNORMAL LOW (ref 4.0–10.5)

## 2010-12-31 LAB — URINALYSIS, ROUTINE W REFLEX MICROSCOPIC
Bilirubin Urine: NEGATIVE
Nitrite: NEGATIVE
Specific Gravity, Urine: 1.01 (ref 1.005–1.030)
Urobilinogen, UA: 0.2 mg/dL (ref 0.0–1.0)
pH: 6 (ref 5.0–8.0)

## 2010-12-31 LAB — DIFFERENTIAL
Eosinophils Absolute: 0.1 10*3/uL (ref 0.0–0.7)
Eosinophils Relative: 2 % (ref 0–5)
Lymphocytes Relative: 26 % (ref 12–46)
Lymphs Abs: 0.9 10*3/uL (ref 0.7–4.0)
Monocytes Absolute: 0.6 10*3/uL (ref 0.1–1.0)
Monocytes Relative: 17 % — ABNORMAL HIGH (ref 3–12)

## 2010-12-31 LAB — PREGNANCY, URINE: Preg Test, Ur: NEGATIVE

## 2010-12-31 LAB — URINE MICROSCOPIC-ADD ON

## 2011-01-04 ENCOUNTER — Telehealth: Payer: Self-pay

## 2011-01-04 NOTE — Telephone Encounter (Signed)
[facility] Triage Call Report Triage Record Num: [id number] Operator: Kathleen Lime Patient Name: Abigail Wilson Call Date & Time: 01/03/2011 5:51:52PM Patient Phone: [phone] PCP: Sonda Primes Patient Gender: Female PCP Fax : [phone] Patient DOB: 01/23/1961 Practice Name: [facility] Reason for Call: Pt, Gavin Pound called about urinary pain, urgency, frequency. No pain in back or flank, no fever, no blood. Emerergent sxs for Urinary Sxs Protocol R/O Pt to call office in morning for appt. Protocol(s) Used: Urinary Symptoms - Female Recommended Outcome per Protocol: See Provider within 24 hours Reason for Outcome: Has one or more urinary tract symptoms Care Advice: ~ Tell provider medical history of renal disease; especially if have only one kidney. ~ Call provider if you develop flank or low back pain, fever, generally feel sick. Increase intake of fluids. Try to drink 8 oz. (.2 liter) every hour when awake, including unsweetened cranberry juice, unless on restricted fluids for other medical reasons. Take sips of fluid or eat ice chips if nauseated or vomiting. ~ ~ Tell your provider if you are taking Warfarin (Coumadin) and drinking cranberry juice or taking cranberry capsules. ~ SYMPTOM / CONDITION MANAGEMENT ~ CAUTIONS Limit carbonated, alcoholic, and caffeinated beverages such as coffee, tea and soda. Avoid nonprescription cold and allergy medications that contain caffeine. Limit intake of tomatoes, fruit juices (except for unsweetened cranberry juice), dairy products, spicy foods, sugar, and artificial sweeteners (aspartame or saccharine). Stop or decrease smoking. Reducing exposure to bladder irritants may help lessen urgency. ~ Analgesic/Antipyretic Advice - Acetaminophen: Consider acetaminophen as directed on label or by pharmacist/provider for pain or fever PRECAUTIONS: - Use if there is no history of liver disease, alcoholism, or intake of three or more alcohol  drinks per day - Only if approved by provider during pregnancy or when breastfeeding - During pregnancy, acetaminophen should not be taken more than 3 consecutive days without telling provider - Do not exceed recommended dose or frequency ~ ~ Call provider if urine is pink, red, smoky or cola colored. Systemic Inflammatory Response Syndrome (SIRS): Watch for signs of a generalized, whole body infection. Occurs within days of a localized infection, especially of the urinary, GI, respiratory or nervous systems; or after a traumatic injury or invasive procedure. - Call EMS 911 if symptoms have worsened, such as increasing confusion or unusual drowsiness; cold and clammy skin; no urine output; rapid respiration (>30/min.) or slow respiration (<10/min.); struggling to breathe. - Go to the ED immediately for early symptoms of rapid pulse >90/min. or rapid breathing >20/min. at rest; chills; oral temperature >100.4 F (38 C) or <96.8 F (36 C) when associated with conditions noted. ~ Analgesic/Antipyretic Advice - NSAIDs: Consider aspirin, ibuprofen, naproxen or ketoprofen for pain or fever as directed on label or by pharmacist/provider. PRECAUTIONS: - If over 36 years of age, should not take longer than 1 week without consulting provider. EXCEPTIONS: - Should not be used if taking blood thinners or have bleeding problems. - Do not use if have history of sensitivity/allergy to any of these medications; or history of cardiovascular, ulcer, kidney, liver disease or diabetes unless approved by provider. - Do not exceed recommended dose or frequency. ~ 01/03/2011 6:05:52PM Page 1 of 1 [id number]

## 2011-01-31 NOTE — Assessment & Plan Note (Signed)
REFERRING PHYSICIAN:  Talmadge Coventry, M.D.   Abigail Wilson is back regarding her chronic pain syndrome.  She is feeling a  little bit better with her exercising and home therapy program. She has not  been diligent about the stretching portion of it.  She is walking a great  deal almost on a daily basis.  She states she walks two to four miles a day.  She has pain most predominantly in the neck and low back regions, although  she has pain throughout multiple other joints.  She remained on OxyContin 80  mg q.8h. as well as the Roxicodone 15 mg b.i.d. p.r.n.  She stopped the  Fentanyl patch per month __________ and has been doing fairly well off of  this.  She rates her pain today at a 6 to 8/10 but really about 6/10 on  average.  Mood has been fair.  She takes Provigil 200 mg daily for  activation.  She would like to quit smoking.  She went through a lot of  stresses over he holidays with her husband who was fighting some depression  and pain issues himself.   REVIEW OF SYMPTOMS:  The patient notes some occasional shortness of breath  with anxiety but no palpitations and normal heart rhythm, chest pain, cold  or flu symptoms, wheezing or coughing.  She denies weakness, numbness,  dizziness, spasms, vertigo, confusion, blurred vision, with poor sleep.  Mood has been down a bit.  Denies nausea, vomiting, reflux, constipation,  weight loss, or gain, swelling or rashes.   PHYSICAL EXAMINATION:  VITAL SIGNS:  Blood pressure is 110/70, pulse is 80,  she is saturating 99% on room air.  NEUROLOGIC:  The patient walks with a normal gait and fair affect.  She is a  little quiet today.  She had some pain to palpation along the deep trapezius  muscles and angling in close to the facets to the left neck area around C4,  C5.  She had fair range of motion except for right lateral bending which was  a little bit restricted at the neck.  Left lateral bending caused some pain  in the trapezius.  Back range  of motion was normal.  There was some back  pain with extreme extension.  She had pain to palpation along the cervical  thoracic spinous processes of the bilateral shoulders, elbows, wrists,  knees, ankles and left hip.  Motor and sensory examination was grossly  intact.  There were no problems with coordination.  She had 2+ reflexes.   ASSESSMENT:  1. Ongoing myofascial pain and questionable fibromyalgia.  2. History of degenerative disc disease and degenerative joint disease in     the cervical and thoracic spine.  3. Polyarthralgias.   PLAN:  1. The patient will continue with home exercise program.  I would like to     see her work on stretching a little bit more and balance that with her     walking.  2. Will switch her OxyContin to 80 mg q.6h. and drop the Roxicodone.  We     have already dropped the Duragesic patches.  3. For activation, she can continue on the Provigil, but we will add Effexor     XR 75 mg daily.  I gave her a starter     pack today.  This certainly could help with mood as well in addition to     pain.  4. I will see her back in approximately one month's time.  Ranelle Oyster, M.D.   ZTS/MedQ  D:  11/06/2003 12:08:26  T:  11/06/2003 12:48:54  Job #:  [id number]   cc:   Talmadge Coventry, M.D.  [address]  [place]  [place] [address]  Fax: [phone]

## 2011-01-31 NOTE — Assessment & Plan Note (Signed)
DATE OF VISIT:  September 30, 2004.   MEDICAL RECORD NUMBER:  [id number].   DATE OF BIRTH:  1960-11-03.   Abigail Wilson is back regarding her chronic pain.  She feels that the Cymbalta has  helped her with her energy to a certain extent.  She is wondering if she can  try the Ritalin again.  The Provigil helps her with energy, but it is tough  because of the cost for her.  She also asked about adding further doses of  OxyContin on board today in addition to her 80 mg q.6h.  She rates her pain  on an average of 7/10.  It will range from a 6-9/10.  She has had some new  pain in the right neck after reaching for an object and getting spasms a few  days ago.  The pain improves with medication and stretching and is worse  with sitting and rest often.  The pain continues to interfere with many of  her quality of life indices.   SOCIAL HISTORY:  The patient continues to smoke.  She is supportive of her  husband, who is also a patient here.   REVIEW OF SYSTEMS:  The patient reports some numbness in the hands, spasms  in the right neck and problems with sleep.  Other review of system items  were negative today.  Other pertinent positives are listed above.   PHYSICAL EXAMINATION:  The blood pressure is 112/67, pulse of 77,  respiratory rate 16 and she is saturating 99% on room air.  The patient  walks with a normal gait and posture.  Affect is bright and alert.  Appearance is fairly well kept.  She had normal strength and motor function.  Neck range of motion is good today.  She had pain, however, with lateral  bending to the left and turning to the left.  I palpated some tenderness  along the C6-7 level on the trapezius muscle.  Pressure there seemed to  reproduce some of her pain.  Reflexes are 2+.  Cognitively the patient was  appropriate.  The low back exam was stable.  Pulses were 2+.  The heart was  regular rate and rhythm.   ASSESSMENT:  1.  Myofascial pain and questionable fibromyalgia.   723.9/729.1  2.  Degenerative disk disease and degenerative joint disease of the cervical      and thoracic spine.  3.  Polyarthralgia.  4.  Bladder spasm.  5.  Depression.   PLAN:  1.  Will continue Cymbalta at 60 mg daily.  I gave her supplements to reach      the 120 mg per day dose to see what type of effect she has.  Her      insurance would not cover the 120 mg dose which requires two 60 mg      tablets.  2.  I refilled the OxyContin 80 mg q.6h., #120.  The patient was pushing to      add 40 mg doses.  She wanted to add a dose in the morning and afternoon,      which I resisted today.  3.  Will try Ritalin once again for arousal.  Will begin the 5 mg daily      increased to 5 mg daily at 7 a.m. and 12 noon after one week's time.      She will discontinue the Provigil after one week.  4.  Continue with home exercise program and massage.  I recommended  a plan      for the right neck pain.  The patient did not want a trigger point      injection today.  5.  I will see the patient back in two months' time.     Zach  ZTS/MedQ  D:  09/30/2004 12:39:18  T:  09/30/2004 12:56:08  Job #:  [id number]

## 2011-01-31 NOTE — Assessment & Plan Note (Signed)
HISTORY OF PRESENT ILLNESS:  Abigail Wilson is back regarding her chronic back  pain.  She had a spell about a week ago with some type of respiratory and  possibly viral infection.  She was seen by her primary care physician.  Apparently, she received Zithromax for respiratory symptoms, and this seems  to have helped her.  Her pain increased substantially while she was ill due  to myalgias and other symptoms.  She was running a high fever.  She received  Oxycodone from her office as well.  The patient feels that she is getting  back to baseline at this point.  She states that her pain right now is about  a 6/10 which is near her baseline.  Pain improves with rest, exercise and  medication.  She gotten back to doing some walking.  Her pain is most severe  in the low back.  Tends to radiate around the back to a certain extent.  It  is worse with chronic standing or sitting.  The pain is sharp, aching in  quality.  She was approved for disability on November 06, 2004.   SOCIAL HISTORY:  Pertinent positive as listed above.   REVIEW OF SYSTEMS:  Patient reports fever, chills, weight gain, night  sweats, urine retention, coughing, respiratory symptoms, occasional spasms,  depression, anxiety.   PHYSICAL EXAMINATION:  VITAL SIGNS:  Blood pressure 106/71, pulse 72,  respirations 16, saturating 98% on room air.  GENERAL APPEARANCE:  The patient was bright and appropriate.  Appearance is  well kept.  She had normal strength and muscle tone today.  NECK:  Range of motion was good.  NEUROLOGICAL:  She had minimal palpation tenderness to the lumbar spine.  Posture was good.  No abdominal tenderness or distention was noted today.  Motor function in the lower extremities was 5/5.  Sensory function was  normal.  Reflexes were 2+.   ASSESSMENT:  1.  Myofascial pain and questionable fibromyalgia.  2.  Degenerative disk disease and joint disease of the cervical and thoracic      spine.  3.  Polyarthralgia.  4.  Bladder spasm.  5.  Depression.   PLAN:  1.  Continue Cymbalta at 120 mg daily.  2.  Refill OxyContin 80 mg q.6h. #120.  3.  Will increase Ritalin for arousal 10 mg daily at 6 a.m. and 12 p.m.  4.  Gave patient samples of [place] which is Omega 3 fatty acid supplement.      This may be helpful for her arthralgias and fatigue symptoms.  If she      finds these helpful, I would recommend some over-the-counter supplements      for her.  5.  Recommend increasing exercise and back range of motion as tolerated.  6.  Checked a routine urine drug screen today  7.  Will see the patient back in three months time.      ZTS/MedQ  D:  11/27/2004 12:00:07  T:  11/27/2004 12:28:38  Job #:  [id number]   cc:   Talmadge Coventry, M.D.  [address]  [place]  [place] [address]  Fax: [phone]

## 2011-01-31 NOTE — Assessment & Plan Note (Signed)
MEDICAL RECORD NUMBER:  [id number]   Abigail Wilson is back regarding her chronic pain syndrome.  She was recently  placed on Cymbalta by her primary care physician for depressed mood.  She  feels like the Cymbalta has been helpful with her mood but not so much with  her pain.  She is currently on 60 mg daily.  She has been struggling with  some of Bobby's personal issues that are now affecting her emotionally.  She  tries to remain active with exercise.  She is using her home exercise  program and is walking generally every day or so.  She rates her pain on the  average of a 6 to 7/10.  She went back on Provigil for activation as this  seems to have been helpful.  She wonders if she should take more than the  200 mg daily dose.  OxyContin remains at 80 mg q.6h.  She is not using  breakthrough medications.   REVIEW OF SYSTEMS:  The patient denies any chest pain, shortness of breath,  cold, flu, wheezing or coughing symptoms.  She denies seizures, weakness,  numbness, spasm, problems with agitation, headaches, nausea, vomiting,  reflux, diarrhea, constipation, poor appetite, bowel or bladder  incontinence, fever, chills, weight changes, rash, or bruising.   PHYSICAL EXAMINATION:  VITAL SIGNS:  Blood pressure 102/59, pulse 75,  respiratory rate 16.  She is saturating 98% on room air.  NEUROLOGIC:  The patient walks with a normal gait.  Affect is bright and  appropriate.  She has excellent cervical range of motion with minimal pain  along the spinous processes of C6-C7.  Neurologic testing in the upper  extremities is normal for motor and sensory function.  Reflexes are 2+.  Spurling's test was negative on either side.  She has fair range of motion  of the arms and shoulders.  Low back exam was fair with good movement with  normal flexion and extension today.  Minimal pain with lateral bending and  rotation.  Cognitively, she was appropriate.  Cranial nerve exam was intact.   ASSESSMENT:  1.   Myofascial pain and questionable fibromyalgia (729.1).  2.  Degenerative disk disease and degenerative joint disease of cervical and      thoracic spine.  3.  Polyarthralgia.  4.  Bladder spasm.  5.  Depression.   PLAN:  1.  The patient will continue with Cymbalta, but we will increase the dose      to 120 mg daily.  2.  I refilled OxyContin 80 mg q.6h., #120.  3.  The patient will continue on Provigil for activation.  We could consider      increasing her to 400 mg daily of the Provigil, though I am not sure      that she will receive a significant amount of further activation with      the higher dose.  She may try this once or twice on her own to see how      she responds.  4.  The patient should continue with her home exercise program.  5.  I will see the patient back in two to three months' time.  6.  I have nothing new to add today.       ZTS/MedQ  D:  07/01/2004 11:37:07  T:  07/01/2004 13:08:01  Job #:  [id number]

## 2011-01-31 NOTE — Assessment & Plan Note (Signed)
HISTORY:  The patient is back regarding her chronic pain.  She has been  rather stable from a pain standpoint.  The pain remains in the neck and  intrascapular regions.  She remains on OxyContin 80 mg q.6h., with fair  results.  She rates her pain on average at 5-6/10.  We tried Ritalin for  arousal, and she notes that she had some problems with urine retention with  that.  We went back to the Provigil and increased it to 200 mg b.i.d.  She  seems to have a little bit more activation with this.  The urinary issues  still seem to be a problem.  She is having some spasms in the hypogastric  region.  She is being worked up by urology and Dr. Talmadge Coventry.   REVIEW OF SYSTEMS:  The patient denies any new chest pain, shortness of  breath, cold, or flu symptoms.  She has had no weakness, numbness,  dizziness, spasm, depression, problems with agitation or sleep.  She has  occasional headaches.  No nausea, vomiting, reflux, or diarrhea reported.  The urinary retention and hypogastric pain are noted.  She denies any fever,  chills, or weight loss.   PHYSICAL EXAMINATION:  GENERAL:  The patient is pleasant, in no acute  distress.  VITAL SIGNS:  Blood pressure 98/60, pulse 88, saturation 98% on room air.  NEUROLOGIC:  The patient walks with a normal gait, alert, and appropriate.  Appearance is normal. Mood is excellent.  No focal changes noted on the  cervical and thoracic examinations.  She has a fair range of motion in all  planes in the cervical and thoracic areas.  Back range of motion was  essentially unchanged.  Gait was appropriate.  Motor and sensory exams were  grossly intact.   ASSESSMENT:  1. Ongoing myofascial pain and questionable fibromyalgia - code #729.1.  2. History of degenerative disk disease and degenerative joint disease of     the cervical spine - code #756.19/#722.4.  3. History of degenerative disk disease and degenerative joint disease of     the thoracic spine -  code #721.2/#722.51.  4. Polyarthralgia.  5. Urinary retention.  6. Depression.   PLAN:  1. The patient will continue with her home exercise program.  She seems to     like gardening and I encouraged more of this, as well as other structured     activities.  2. We will continue with OxyContin 80 mg q.6h., #120.  3. We will stay with the Provigil 200 mg b.i.d.  4. The patient is to continue with her urological and hypogastric workup per     Dr. Smith Mince.  5. I will see the patient back in three months' time.      Ranelle Oyster, M.D.   ZTS/MedQ  D:  01/03/2004 11:03:40  T:  01/03/2004 11:31:38  Job #:  [id number]

## 2011-01-31 NOTE — Assessment & Plan Note (Signed)
[facility]                                 ON-CALL NOTE   NAME:Abigail Wilson, Abigail Wilson                   MRN:          [id number]  DATE:12/24/2006                            DOB:          05/13/1961    TIME:  5:45 p.m.   PHONE NUMBER:  [phone].   Patient has questions about medications.  Is on OxyContin.  Is due to  get it filled on Monday.  Would like to have it refilled this afternoon  because she is going out of town tomorrow with her girlfriends.  Wants  me to call [facility] to concur.   OBJECTIVE:  Chronic pain.   PLAN:  Spoke with Dr. Posey Rea, who called me back.  Patient is  legitimate.  Did call the pharmacy and okayed an early refill.   PRIMARY CARE PHYSICIAN:  Georgina Quint. Plotnikov, M.D.   HOME OFFICE:  [place].   Parenthetically, the patient actually called back in 15 minutes after  being paged the first time, when I did not call in that amount of time,  to insure that I would call her.     Arta Silence, MD  Electronically Signed    RNS/MedQ  DD: 12/24/2006  DT: 12/25/2006  Job #: [id number]

## 2011-01-31 NOTE — Assessment & Plan Note (Signed)
Abigail Wilson is back regarding her chronic pain syndrome which is due to  myofascial pain and fibromyalgia.  Abigail Wilson has been doing pretty well from a  standpoint of her pain.  She has had some generalized pain in the low back,  neck, and arms.  She has had no focal areas of exacerbation.  She seemed to  respond well to the therapies and the home exercise program.  She is walking  daily and performing her stretching exercises.  Pain ranges from a 5-7/10.  She has done well on the OxyContin 80 mg q.6h.  She is using nothing for  breakthrough pain.  She has had difficulty, as well as her husband, dealing  with the fact that he lost his job recently after he was promised that his  job was secure.  She has been a little bit more anxious and down from a mood  standpoint.  She felt that the Effexor was also making her more labile, but  she was not positive due to the fact that she had dealt with these other  social issues.  She continues on the Provigil 200 mg daily, but feels that  the effects wane during the afternoon hours and she often has to take a nap.  She looks forward to the summer and spring as the weather warms where she  can get out, do some biking, and get into the pool once again.   REVIEW OF SYSTEMS:  The patient denies any chest pain, shortness of breath,  coughing, cold symptoms, wheezing, or swelling.  She denies any weakness,  numbness, dizziness, spasms, or confusion.  She has had some anxiety.  Mood  has been a little bit depressed.  She denies any problems with sleep or  headaches.  She has had no nausea, vomiting, diarrhea, constipation, or  bladder or bowel incontinence.   PHYSICAL EXAMINATION:  On physical examination today, the blood pressure is  119/70, the pulse is 78, the respiratory rate is 16, and she is saturating  97% on room air.  The patient walks with a normal gait and has a fair  affect.  Her appearance is generally normal.  She had some tightness with  cervical  flexion and lateral bending, but seemed to be less tender today.  She had good scapular range of motion and shoulder movement today without  pain.  There were no focal tender points palpated in the neck and shoulder  areas.  The low back was stable with fair range of motion.  The neurological  exam was stable for both motor and sensory function.  She had 2+ reflexes.  She had less pain in the cervical area at C4-5 when compared to our last  exam.   ASSESSMENT:  1. Myofascial pain and questionable fibromyalgia.  2. History of cervical and degenerative disk disease and degenerative joint     disease.  To a lesser extent the thoracic spine is involved.  3. Polyarthralgias.  4. Chronic fatigue.   PLAN:  1. The patient will continue with her home exercise program.  I would like     to see her broaden her activity as the weather improves and we discussed     some activities today.  2. Continue with her OxyContin 80 mg q.6h.  Dispense 120 of these today.  3. For her activation, we will stop the Provigil and be on a trial of     Ritalin to see if this will help her more during the daytime hours  beginning at 5 mg in the morning and at lunchtime for one week and then     10 mg thereafter.     If she has any problems, we certainly can go back to the Provigil and     even try an increased dose.  4. I will see her back in approximately one months' time.      Ranelle Oyster, M.D.   ZTS/MedQ  D:  12/06/2003 11:10:13  T:  12/06/2003 14:53:11  Job #:  [id number]   cc:   Talmadge Coventry, M.D.  [address]  [place]  [place] [address]  Fax: [phone]

## 2011-02-07 ENCOUNTER — Encounter: Payer: Self-pay | Admitting: Internal Medicine

## 2011-02-07 ENCOUNTER — Ambulatory Visit (INDEPENDENT_AMBULATORY_CARE_PROVIDER_SITE_OTHER): Payer: Medicare Other | Admitting: Internal Medicine

## 2011-02-07 DIAGNOSIS — M545 Low back pain: Secondary | ICD-10-CM

## 2011-02-07 DIAGNOSIS — J449 Chronic obstructive pulmonary disease, unspecified: Secondary | ICD-10-CM

## 2011-02-07 DIAGNOSIS — E042 Nontoxic multinodular goiter: Secondary | ICD-10-CM

## 2011-02-07 MED ORDER — OXYCODONE HCL 40 MG PO TB12: 40.0000 mg | ORAL_TABLET | Freq: Four times a day (QID) | ORAL | Status: DC | PRN

## 2011-02-07 MED ORDER — OXYCODONE HCL 80 MG PO TB12: 80.0000 mg | ORAL_TABLET | Freq: Four times a day (QID) | ORAL | Status: DC

## 2011-02-07 NOTE — Assessment & Plan Note (Signed)
On Rx 

## 2011-02-07 NOTE — Progress Notes (Signed)
  Subjective:    Patient ID: Abigail Wilson, female    DOB: [birth year], 50 y.o.   MRN: [id number]  HPI    Review of Systems  Constitutional: Negative for activity change.  HENT: Negative for neck stiffness.   Respiratory: Negative for cough.   Cardiovascular: Negative for leg swelling.  Genitourinary: Negative for pelvic pain.  Musculoskeletal: Positive for back pain.  Neurological: Negative for headaches.  Psychiatric/Behavioral: Negative for decreased concentration. The patient is not nervous/anxious.        Objective:   Physical Exam  Constitutional: She appears well-developed and well-nourished. No distress.  HENT:  Head: Normocephalic.  Right Ear: External ear normal.  Left Ear: External ear normal.  Nose: Nose normal.  Mouth/Throat: Oropharynx is clear and moist.  Eyes: Conjunctivae are normal. Pupils are equal, round, and reactive to light. Right eye exhibits no discharge. Left eye exhibits no discharge.  Neck: Normal range of motion. Neck supple. No JVD present. No tracheal deviation present. No thyromegaly present.  Cardiovascular: Normal rate, regular rhythm and normal heart sounds.   Pulmonary/Chest: No stridor. No respiratory distress. She has no wheezes.  Abdominal: Soft. Bowel sounds are normal. She exhibits no distension and no mass. There is no tenderness. There is no rebound and no guarding.  Musculoskeletal: She exhibits tenderness (LS is tender). She exhibits no edema.  Lymphadenopathy:    She has no cervical adenopathy.  Neurological: She displays normal reflexes. No cranial nerve deficit. She exhibits normal muscle tone. Coordination normal.  Skin: No rash noted. No erythema.  Psychiatric: She has a normal mood and affect. Her behavior is normal. Judgment and thought content normal.          Assessment & Plan:  GOITER, MULTINODULAR On Rx  COPD On Rx  LOW BACK PAIN On Rx  Meds refilled

## 2011-02-07 NOTE — Assessment & Plan Note (Signed)
On Rx. Meds refilled. 

## 2011-04-14 ENCOUNTER — Telehealth: Payer: Self-pay | Admitting: *Deleted

## 2011-04-14 NOTE — Telephone Encounter (Signed)
Pt requesting refill on both oxycodones. Please advise

## 2011-04-21 ENCOUNTER — Ambulatory Visit (INDEPENDENT_AMBULATORY_CARE_PROVIDER_SITE_OTHER): Payer: Medicare Other | Admitting: Internal Medicine

## 2011-04-21 ENCOUNTER — Encounter: Payer: Self-pay | Admitting: Internal Medicine

## 2011-04-21 DIAGNOSIS — F329 Major depressive disorder, single episode, unspecified: Secondary | ICD-10-CM

## 2011-04-21 DIAGNOSIS — E039 Hypothyroidism, unspecified: Secondary | ICD-10-CM

## 2011-04-21 DIAGNOSIS — M545 Low back pain: Secondary | ICD-10-CM

## 2011-04-21 DIAGNOSIS — R031 Nonspecific low blood-pressure reading: Secondary | ICD-10-CM

## 2011-04-21 MED ORDER — OXYCODONE HCL 40 MG PO TB12: 40.0000 mg | ORAL_TABLET | Freq: Four times a day (QID) | ORAL | Status: DC | PRN

## 2011-04-21 MED ORDER — OXYCODONE HCL 80 MG PO TB12: 80.0000 mg | ORAL_TABLET | Freq: Four times a day (QID) | ORAL | Status: DC

## 2011-04-21 NOTE — Progress Notes (Signed)
  Subjective:    Patient ID: Abigail Wilson, female    DOB: 1961-06-19, 50 y.o.   MRN: [id number]  HPI  The patient presents for a follow-up of  chronic hypotension, LBP controlled with medicines    Review of Systems  Constitutional: Negative for chills, activity change, appetite change, fatigue and unexpected weight change.  HENT: Negative for congestion, mouth sores and sinus pressure.   Eyes: Negative for visual disturbance.  Respiratory: Negative for cough and chest tightness.   Gastrointestinal: Negative for nausea and abdominal pain.  Genitourinary: Negative for frequency, difficulty urinating and vaginal pain.  Musculoskeletal: Positive for back pain. Negative for gait problem.  Skin: Negative for pallor and rash.  Neurological: Negative for dizziness, tremors, weakness, numbness and headaches.  Psychiatric/Behavioral: Negative for confusion and sleep disturbance.       Objective:   Physical Exam  Constitutional: She appears well-developed and well-nourished. No distress.  HENT:  Head: Normocephalic.  Right Ear: External ear normal.  Left Ear: External ear normal.  Nose: Nose normal.  Mouth/Throat: Oropharynx is clear and moist.  Eyes: Conjunctivae are normal. Pupils are equal, round, and reactive to light. Right eye exhibits no discharge. Left eye exhibits no discharge.  Neck: Normal range of motion. Neck supple. No JVD present. No tracheal deviation present. No thyromegaly present.  Cardiovascular: Normal rate, regular rhythm and normal heart sounds.   Pulmonary/Chest: No stridor. No respiratory distress. She has no wheezes.  Abdominal: Soft. Bowel sounds are normal. She exhibits no distension and no mass. There is no tenderness. There is no rebound and no guarding.  Musculoskeletal: She exhibits tenderness (LS is tender). She exhibits no edema.  Lymphadenopathy:    She has no cervical adenopathy.  Neurological: She displays normal reflexes. No cranial nerve  deficit. She exhibits normal muscle tone. Coordination normal.  Skin: No rash noted. No erythema.  Psychiatric: Her behavior is normal. Judgment and thought content normal.          Assessment & Plan:

## 2011-04-21 NOTE — Assessment & Plan Note (Signed)
On Rx 

## 2011-05-25 ENCOUNTER — Other Ambulatory Visit: Payer: Self-pay | Admitting: Internal Medicine

## 2011-06-30 ENCOUNTER — Other Ambulatory Visit: Payer: Self-pay | Admitting: *Deleted

## 2011-06-30 ENCOUNTER — Other Ambulatory Visit: Payer: Self-pay | Admitting: Internal Medicine

## 2011-06-30 ENCOUNTER — Telehealth: Payer: Self-pay | Admitting: *Deleted

## 2011-06-30 MED ORDER — VARENICLINE TARTRATE 0.5 MG X 11 & 1 MG X 42 PO MISC
ORAL | Status: AC
Start: 2011-06-30 — End: 2011-09-28

## 2011-06-30 MED ORDER — VARENICLINE TARTRATE 0.5 MG X 11 & 1 MG X 42 PO MISC: ORAL | Status: DC

## 2011-06-30 NOTE — Telephone Encounter (Signed)
Left pt vm to check w/her pharm.

## 2011-06-30 NOTE — Telephone Encounter (Signed)
Patient requesting RX for chantix. She has been off med for a while and is requesting RX for starter pack.

## 2011-06-30 NOTE — Telephone Encounter (Signed)
done

## 2011-07-22 ENCOUNTER — Encounter: Payer: Self-pay | Admitting: Internal Medicine

## 2011-07-22 ENCOUNTER — Ambulatory Visit (INDEPENDENT_AMBULATORY_CARE_PROVIDER_SITE_OTHER): Payer: Medicare Other | Admitting: Internal Medicine

## 2011-07-22 VITALS — BP 92/50 | HR 80 | Temp 98.4°F | Resp 16 | Wt 130.0 lb

## 2011-07-22 DIAGNOSIS — M545 Low back pain, unspecified: Secondary | ICD-10-CM

## 2011-07-22 DIAGNOSIS — F329 Major depressive disorder, single episode, unspecified: Secondary | ICD-10-CM

## 2011-07-22 DIAGNOSIS — J449 Chronic obstructive pulmonary disease, unspecified: Secondary | ICD-10-CM

## 2011-07-22 DIAGNOSIS — Z23 Encounter for immunization: Secondary | ICD-10-CM

## 2011-07-22 DIAGNOSIS — J4489 Other specified chronic obstructive pulmonary disease: Secondary | ICD-10-CM

## 2011-07-22 DIAGNOSIS — F3289 Other specified depressive episodes: Secondary | ICD-10-CM

## 2011-07-22 DIAGNOSIS — M1990 Unspecified osteoarthritis, unspecified site: Secondary | ICD-10-CM

## 2011-07-22 MED ORDER — OXYCODONE HCL 80 MG PO TB12: 80.0000 mg | ORAL_TABLET | Freq: Four times a day (QID) | ORAL | Status: DC

## 2011-07-22 MED ORDER — OXYCODONE HCL 40 MG PO TB12: 40.0000 mg | ORAL_TABLET | Freq: Four times a day (QID) | ORAL | Status: DC | PRN

## 2011-07-22 NOTE — Assessment & Plan Note (Signed)
Continue with current prescription therapy as reflected on the Med list.  

## 2011-07-22 NOTE — Assessment & Plan Note (Signed)
Not on Rx 

## 2011-07-22 NOTE — Progress Notes (Signed)
  Subjective:    Patient ID: Abigail Wilson, female    DOB: 12-Oct-1960, 50 y.o.   MRN: [id number]  HPI   The patient is here to follow up on chronic LBP, occ swelling and hypokalemia in the past and chronic moderate fibromyalgia symptoms controlled with medicines   Review of Systems  Constitutional: Negative for chills, activity change, appetite change, fatigue and unexpected weight change.  HENT: Negative for congestion, mouth sores and sinus pressure.   Eyes: Negative for visual disturbance.  Respiratory: Negative for cough and chest tightness.   Gastrointestinal: Negative for nausea and abdominal pain.  Genitourinary: Negative for frequency, difficulty urinating and vaginal pain.  Musculoskeletal: Positive for back pain. Negative for gait problem.  Skin: Negative for pallor and rash.  Neurological: Negative for dizziness, tremors, weakness, numbness and headaches.  Psychiatric/Behavioral: Negative for confusion and sleep disturbance.       Objective:   Physical Exam  Constitutional: She appears well-developed and well-nourished. No distress.  HENT:  Head: Normocephalic.  Right Ear: External ear normal.  Left Ear: External ear normal.  Nose: Nose normal.  Mouth/Throat: Oropharynx is clear and moist.  Eyes: Conjunctivae are normal. Pupils are equal, round, and reactive to light. Right eye exhibits no discharge. Left eye exhibits no discharge.  Neck: Normal range of motion. Neck supple. No JVD present. No tracheal deviation present. No thyromegaly present.  Cardiovascular: Normal rate, regular rhythm and normal heart sounds.   Pulmonary/Chest: No stridor. No respiratory distress. She has no wheezes.  Abdominal: Soft. Bowel sounds are normal. She exhibits no distension and no mass. There is no tenderness. There is no rebound and no guarding.  Musculoskeletal: She exhibits tenderness (LBP). She exhibits no edema.  Lymphadenopathy:    She has no cervical adenopathy.    Neurological: She displays normal reflexes. No cranial nerve deficit. She exhibits normal muscle tone. Coordination normal.  Skin: No rash noted. No erythema.  Psychiatric: She has a normal mood and affect. Her behavior is normal. Judgment and thought content normal.          Assessment & Plan:

## 2011-09-23 ENCOUNTER — Encounter: Payer: Self-pay | Admitting: Internal Medicine

## 2011-09-23 ENCOUNTER — Ambulatory Visit (INDEPENDENT_AMBULATORY_CARE_PROVIDER_SITE_OTHER): Payer: Medicare Other | Admitting: Internal Medicine

## 2011-09-23 DIAGNOSIS — E890 Postprocedural hypothyroidism: Secondary | ICD-10-CM

## 2011-09-23 DIAGNOSIS — F329 Major depressive disorder, single episode, unspecified: Secondary | ICD-10-CM

## 2011-09-23 DIAGNOSIS — M545 Low back pain: Secondary | ICD-10-CM | POA: Diagnosis not present

## 2011-09-23 DIAGNOSIS — J449 Chronic obstructive pulmonary disease, unspecified: Secondary | ICD-10-CM

## 2011-09-23 DIAGNOSIS — M810 Age-related osteoporosis without current pathological fracture: Secondary | ICD-10-CM | POA: Diagnosis not present

## 2011-09-23 MED ORDER — OXYCODONE HCL 40 MG PO TB12: 40.0000 mg | ORAL_TABLET | Freq: Four times a day (QID) | ORAL | Status: DC | PRN

## 2011-09-23 MED ORDER — OXYCODONE HCL 80 MG PO TB12: 80.0000 mg | ORAL_TABLET | Freq: Four times a day (QID) | ORAL | Status: DC

## 2011-09-23 NOTE — Assessment & Plan Note (Signed)
Continue with current prescription therapy as reflected on the Med list.  

## 2011-09-23 NOTE — Progress Notes (Signed)
  Subjective:    Patient ID: Abigail Wilson, female    DOB: [birth year], 51 y.o.   MRN: [id number]  HPI  The patient presents for a follow-up of  chronic hypertension, chronic dyslipidemia, type 2 diabetes controlled with medicines    Review of Systems  Constitutional: Negative for fever, chills, diaphoresis, activity change, appetite change, fatigue and unexpected weight change.  HENT: Negative for hearing loss, ear pain, congestion, sore throat, sneezing, mouth sores, neck pain, dental problem, voice change, postnasal drip and sinus pressure.   Eyes: Negative for pain and visual disturbance.  Respiratory: Negative for cough, chest tightness, wheezing and stridor.   Cardiovascular: Negative for chest pain, palpitations and leg swelling.  Gastrointestinal: Negative for nausea, vomiting, abdominal pain, blood in stool, abdominal distention and rectal pain.  Genitourinary: Negative for dysuria, hematuria, decreased urine volume, vaginal bleeding, vaginal discharge, difficulty urinating, vaginal pain and menstrual problem.  Musculoskeletal: Positive for back pain. Negative for joint swelling and gait problem.  Skin: Negative for color change, rash and wound.  Neurological: Negative for dizziness, tremors, syncope, speech difficulty and light-headedness.  Hematological: Negative for adenopathy.  Psychiatric/Behavioral: Negative for suicidal ideas, hallucinations, behavioral problems, confusion, sleep disturbance, dysphoric mood and decreased concentration. The patient is not hyperactive.        Objective:   Physical Exam  Constitutional: She appears well-developed. No distress.  HENT:  Head: Normocephalic.  Right Ear: External ear normal.  Left Ear: External ear normal.  Nose: Nose normal.  Mouth/Throat: Oropharynx is clear and moist.  Eyes: Conjunctivae are normal. Pupils are equal, round, and reactive to light. Right eye exhibits no discharge. Left eye exhibits no discharge.  Neck:  Normal range of motion. Neck supple. No JVD present. No tracheal deviation present. No thyromegaly present.  Cardiovascular: Normal rate, regular rhythm and normal heart sounds.   Pulmonary/Chest: No stridor. No respiratory distress. She has no wheezes.  Abdominal: Soft. Bowel sounds are normal. She exhibits no distension and no mass. There is no tenderness. There is no rebound and no guarding.  Musculoskeletal: She exhibits tenderness (LS is tender w/ROM and palpation). She exhibits no edema.  Lymphadenopathy:    She has no cervical adenopathy.  Neurological: She displays normal reflexes. No cranial nerve deficit. She exhibits normal muscle tone. Coordination normal.  Skin: No rash noted. No erythema.  Psychiatric: She has a normal mood and affect. Her behavior is normal. Judgment and thought content normal.          Assessment & Plan:   RTC 2 mo: well w/labs

## 2011-10-01 ENCOUNTER — Ambulatory Visit: Payer: Medicare Other | Admitting: Internal Medicine

## 2011-10-13 ENCOUNTER — Other Ambulatory Visit: Payer: Self-pay | Admitting: *Deleted

## 2011-10-13 MED ORDER — FUROSEMIDE 40 MG PO TABS: 40.0000 mg | ORAL_TABLET | Freq: Every day | ORAL | Status: DC

## 2011-10-13 MED ORDER — POTASSIUM CHLORIDE CRYS ER 20 MEQ PO TBCR: 20.0000 meq | EXTENDED_RELEASE_TABLET | Freq: Every day | ORAL | Status: DC

## 2011-10-16 ENCOUNTER — Other Ambulatory Visit: Payer: Self-pay | Admitting: *Deleted

## 2011-10-16 MED ORDER — FUROSEMIDE 40 MG PO TABS: 40.0000 mg | ORAL_TABLET | Freq: Every day | ORAL | Status: DC

## 2011-10-27 ENCOUNTER — Ambulatory Visit: Payer: Medicare Other | Admitting: Gynecology

## 2011-11-17 ENCOUNTER — Telehealth: Payer: Self-pay | Admitting: *Deleted

## 2011-11-17 MED ORDER — OXYCODONE HCL 40 MG PO TB12: 40.0000 mg | ORAL_TABLET | Freq: Four times a day (QID) | ORAL | Status: DC | PRN

## 2011-11-17 MED ORDER — OXYCODONE HCL 80 MG PO TB12: 80.0000 mg | ORAL_TABLET | Freq: Four times a day (QID) | ORAL | Status: DC

## 2011-11-17 NOTE — Telephone Encounter (Signed)
Pt is req Rf on Oxycontin 40 mg and Oxycontin 80 mg. Fill date is the 9th. Ok to Rf in PCP absence?

## 2011-11-17 NOTE — Telephone Encounter (Signed)
This is very high dose oxycodone  I reviewed [place] controlled substance reporting and appears this is correct  OK for refill - Done hardcopy to robin  Fill date will be mar 10, as there are only 28 days in Feb 2013, and pt received 30 day supply feb 9  

## 2011-11-17 NOTE — Telephone Encounter (Signed)
This is very high dose oxycodone  I reviewed [place] controlled substance reporting and appears this is correct  OK for refill - Done hardcopy to robin  Fill date will be mar 10, as there are only 28 days in Feb 2013, and pt received 30 day supply feb 9

## 2011-11-18 NOTE — Telephone Encounter (Signed)
Called the patient left a detailed message that prescription requested is ready for pickup at front desk.

## 2011-11-20 ENCOUNTER — Other Ambulatory Visit: Payer: Self-pay | Admitting: *Deleted

## 2011-11-20 MED ORDER — OXYCODONE HCL 40 MG PO TB12: 40.0000 mg | ORAL_TABLET | Freq: Four times a day (QID) | ORAL | Status: DC | PRN

## 2011-11-20 MED ORDER — OXYCODONE HCL 80 MG PO TB12: 80.0000 mg | ORAL_TABLET | Freq: Four times a day (QID) | ORAL | Status: DC

## 2011-12-09 ENCOUNTER — Encounter: Payer: Self-pay | Admitting: Internal Medicine

## 2011-12-09 ENCOUNTER — Ambulatory Visit (INDEPENDENT_AMBULATORY_CARE_PROVIDER_SITE_OTHER): Payer: Medicare Other | Admitting: Internal Medicine

## 2011-12-09 VITALS — BP 110/66 | HR 88 | Temp 98.3°F | Resp 16 | Ht 65.5 in | Wt 126.0 lb

## 2011-12-09 DIAGNOSIS — F329 Major depressive disorder, single episode, unspecified: Secondary | ICD-10-CM | POA: Diagnosis not present

## 2011-12-09 DIAGNOSIS — R7989 Other specified abnormal findings of blood chemistry: Secondary | ICD-10-CM | POA: Diagnosis not present

## 2011-12-09 DIAGNOSIS — Z0000 Encounter for general adult medical examination without abnormal findings: Secondary | ICD-10-CM | POA: Diagnosis not present

## 2011-12-09 DIAGNOSIS — Z23 Encounter for immunization: Secondary | ICD-10-CM

## 2011-12-09 DIAGNOSIS — R109 Unspecified abdominal pain: Secondary | ICD-10-CM

## 2011-12-09 DIAGNOSIS — Z8619 Personal history of other infectious and parasitic diseases: Secondary | ICD-10-CM | POA: Diagnosis not present

## 2011-12-09 DIAGNOSIS — L049 Acute lymphadenitis, unspecified: Secondary | ICD-10-CM

## 2011-12-09 DIAGNOSIS — E042 Nontoxic multinodular goiter: Secondary | ICD-10-CM

## 2011-12-09 DIAGNOSIS — Z136 Encounter for screening for cardiovascular disorders: Secondary | ICD-10-CM

## 2011-12-09 MED ORDER — OXYCODONE HCL 40 MG PO TB12: 40.0000 mg | ORAL_TABLET | Freq: Four times a day (QID) | ORAL | Status: DC | PRN

## 2011-12-09 MED ORDER — OXYCODONE HCL 80 MG PO TB12: 80.0000 mg | ORAL_TABLET | Freq: Four times a day (QID) | ORAL | Status: DC

## 2011-12-09 MED ORDER — ALBUTEROL SULFATE HFA 108 (90 BASE) MCG/ACT IN AERS: 2.0000 | INHALATION_SPRAY | Freq: Four times a day (QID) | RESPIRATORY_TRACT | Status: DC | PRN

## 2011-12-09 MED ORDER — TRIAMCINOLONE ACETONIDE(NASAL) 55 MCG/ACT NA INHA: 2.0000 | Freq: Every day | NASAL | Status: DC | PRN

## 2011-12-09 NOTE — Assessment & Plan Note (Addendum)
The patient is here for annual Medicare wellness examination and management of other chronic and acute problems.   The risk factors are reflected in the social history.  The roster of all physicians providing medical care to patient - is listed in the Snapshot section of the chart.  Activities of daily living:  The patient is 100% inedpendent in all ADLs: dressing, toileting, feeding as well as independent mobility  Home safety : The patient has smoke detectors in the home. They wear seatbelts.No firearms at home ( firearms are present in the home, kept in a safe fashion). There is no violence in the home.   There is no risks for hepatitis, STDs or HIV. There is no   history of blood transfusion. They have no travel history to infectious disease endemic areas of the world.  The patient has  seen their dentist in the last six month. They have (not) seen their eye doctor in the last year. They deny (admit to) any hearing difficulty and have not had audiologic testing in the last year.  They do not  have excessive sun exposure. Discussed the need for sun protection: hats, long sleeves and use of sunscreen if there is significant sun exposure.   Diet: the importance of a healthy diet is discussed. They do have a healthy (unhealthy-high fat/fast food) diet.  The patient has a regular exercise program:yardwork.  The benefits of regular aerobic exercise were discussed.  Depression screen: there are no signs or vegative symptoms of depression- irritability, change in appetite, anhedonia, sadness/tearfullness.  Cognitive assessment: the patient manages all their financial and personal affairs and is actively engaged. They could relate day,date,year and events; recalled 3/3 objects at 3 minutes; performed clock-face test normally.  The following portions of the patient's history were reviewed and updated as appropriate: allergies, current medications, past family history, past medical history,  past  surgical history, past social history  and problem list.  Vision, hearing, body mass index were assessed and reviewed.   During the course of the visit the patient was educated and counseled about appropriate screening and preventive services including : fall prevention , diabetes screening, nutrition counseling, colorectal cancer screening, and recommended immunizations.  Ophth consult

## 2011-12-09 NOTE — Assessment & Plan Note (Signed)
resolved 

## 2011-12-09 NOTE — Assessment & Plan Note (Signed)
Not depressed. 

## 2011-12-09 NOTE — Progress Notes (Signed)
Patient ID: Abigail Wilson, female   DOB: 10-Apr-1961, 51 y.o.   MRN: [id number]  Subjective:    Patient ID: Abigail Wilson, female    DOB: 1960-10-09, 51 y.o.   MRN: [id number]  HPI The patient is here for a wellness exam. The patient has been doing well overall without major acute physical or psychological issues going on lately. The patient needs to address  chronic COPD that has been well controlled with medicines; to address chronic goiter controlled with medicines as well; and to address LBP, controlled with medical treatment and diet.  Wt Readings from Last 3 Encounters:  12/09/11 126 lb (57.153 kg)  09/23/11 131 lb (59.421 kg)  07/22/11 130 lb (58.968 kg)    BP Readings from Last 3 Encounters:  12/09/11 110/66  09/23/11 100/60  07/22/11 92/50      Review of Systems  Constitutional: Negative for fever, chills, diaphoresis, activity change, appetite change, fatigue and unexpected weight change.  HENT: Negative for hearing loss, ear pain, congestion, sore throat, sneezing, mouth sores, neck pain, dental problem, voice change, postnasal drip and sinus pressure.   Eyes: Negative for pain and visual disturbance.  Respiratory: Negative for cough, chest tightness, wheezing and stridor.   Cardiovascular: Negative for chest pain, palpitations and leg swelling.  Gastrointestinal: Negative for nausea, vomiting, abdominal pain, blood in stool, abdominal distention and rectal pain.  Genitourinary: Negative for dysuria, hematuria, decreased urine volume, vaginal bleeding, vaginal discharge, difficulty urinating, vaginal pain and menstrual problem.  Musculoskeletal: Positive for back pain. Negative for joint swelling and gait problem.  Skin: Negative for color change, rash and wound.  Neurological: Negative for dizziness, tremors, syncope, speech difficulty and light-headedness.  Hematological: Negative for adenopathy.  Psychiatric/Behavioral: Negative for suicidal ideas, hallucinations,  behavioral problems, confusion, sleep disturbance, dysphoric mood and decreased concentration. The patient is not hyperactive.        Objective:   Physical Exam  Constitutional: She appears well-developed. No distress.  HENT:  Head: Normocephalic.  Right Ear: External ear normal.  Left Ear: External ear normal.  Nose: Nose normal.  Mouth/Throat: Oropharynx is clear and moist.  Eyes: Conjunctivae are normal. Pupils are equal, round, and reactive to light. Right eye exhibits no discharge. Left eye exhibits no discharge.  Neck: Normal range of motion. Neck supple. No JVD present. No tracheal deviation present. No thyromegaly present.  Cardiovascular: Normal rate, regular rhythm and normal heart sounds.   Pulmonary/Chest: No stridor. No respiratory distress. She has no wheezes.  Abdominal: Soft. Bowel sounds are normal. She exhibits no distension and no mass. There is no tenderness. There is no rebound and no guarding.  Musculoskeletal: She exhibits tenderness (LS is tender w/ROM and palpation). She exhibits no edema.  Lymphadenopathy:    She has no cervical adenopathy.  Neurological: She displays normal reflexes. No cranial nerve deficit. She exhibits normal muscle tone. Coordination normal.  Skin: No rash noted. No erythema.  Psychiatric: She has a normal mood and affect. Her behavior is normal. Judgment and thought content normal.   Lab Results  Component Value Date   WBC 5.8 08/13/2010   HGB 13.2 08/13/2010   HCT 37.7 08/13/2010   PLT 181.0 08/13/2010   GLUCOSE 85 08/13/2010   CHOL 188 08/13/2010   TRIG 64.0 08/13/2010   HDL 81.40 08/13/2010   LDLCALC 94 08/13/2010   ALT 16 08/13/2010   AST 23 08/13/2010   NA 138 08/13/2010   K 4.4 08/13/2010   CL 101 08/13/2010  CREATININE 0.8 08/13/2010   BUN 13 08/13/2010   CO2 30 08/13/2010   TSH 4.16 08/13/2010          Assessment & Plan:

## 2011-12-09 NOTE — Assessment & Plan Note (Signed)
Remote - treated/cured 2001-2002 Dr Kinnie Scales

## 2011-12-09 NOTE — Assessment & Plan Note (Signed)
TSH 

## 2011-12-15 DIAGNOSIS — H2510 Age-related nuclear cataract, unspecified eye: Secondary | ICD-10-CM | POA: Diagnosis not present

## 2011-12-15 DIAGNOSIS — H43399 Other vitreous opacities, unspecified eye: Secondary | ICD-10-CM | POA: Diagnosis not present

## 2011-12-15 DIAGNOSIS — H5319 Other subjective visual disturbances: Secondary | ICD-10-CM | POA: Diagnosis not present

## 2012-01-08 ENCOUNTER — Other Ambulatory Visit (INDEPENDENT_AMBULATORY_CARE_PROVIDER_SITE_OTHER): Payer: Medicare Other

## 2012-01-08 DIAGNOSIS — E042 Nontoxic multinodular goiter: Secondary | ICD-10-CM

## 2012-01-08 DIAGNOSIS — Z8619 Personal history of other infectious and parasitic diseases: Secondary | ICD-10-CM | POA: Diagnosis not present

## 2012-01-08 DIAGNOSIS — F329 Major depressive disorder, single episode, unspecified: Secondary | ICD-10-CM | POA: Diagnosis not present

## 2012-01-08 DIAGNOSIS — Z136 Encounter for screening for cardiovascular disorders: Secondary | ICD-10-CM | POA: Diagnosis not present

## 2012-01-08 DIAGNOSIS — Z0000 Encounter for general adult medical examination without abnormal findings: Secondary | ICD-10-CM | POA: Diagnosis not present

## 2012-01-08 LAB — HEPATIC FUNCTION PANEL
AST: 28 U/L (ref 0–37)
Alkaline Phosphatase: 48 U/L (ref 39–117)
Total Bilirubin: 0.3 mg/dL (ref 0.3–1.2)

## 2012-01-08 LAB — BASIC METABOLIC PANEL
CO2: 28 mEq/L (ref 19–32)
Chloride: 104 mEq/L (ref 96–112)
Creatinine, Ser: 0.8 mg/dL (ref 0.4–1.2)
Glucose, Bld: 84 mg/dL (ref 70–99)

## 2012-01-08 LAB — LIPID PANEL
Total CHOL/HDL Ratio: 2
Triglycerides: 50 mg/dL (ref 0.0–149.0)

## 2012-01-08 LAB — URINALYSIS
Ketones, ur: NEGATIVE
Specific Gravity, Urine: 1.025 (ref 1.000–1.030)
Urine Glucose: NEGATIVE
pH: 5.5 (ref 5.0–8.0)

## 2012-01-08 LAB — CBC WITH DIFFERENTIAL/PLATELET
Basophils Relative: 0.6 % (ref 0.0–3.0)
Eosinophils Absolute: 0.1 10*3/uL (ref 0.0–0.7)
MCHC: 34 g/dL (ref 30.0–36.0)
MCV: 98.1 fl (ref 78.0–100.0)
Monocytes Absolute: 0.6 10*3/uL (ref 0.1–1.0)
Neutro Abs: 3.2 10*3/uL (ref 1.4–7.7)
Neutrophils Relative %: 60.7 % (ref 43.0–77.0)
RBC: 3.8 Mil/uL — ABNORMAL LOW (ref 3.87–5.11)

## 2012-03-16 ENCOUNTER — Ambulatory Visit: Payer: Medicare Other | Admitting: Internal Medicine

## 2012-03-18 ENCOUNTER — Other Ambulatory Visit: Payer: Self-pay | Admitting: Internal Medicine

## 2012-03-19 ENCOUNTER — Encounter: Payer: Self-pay | Admitting: Internal Medicine

## 2012-03-19 ENCOUNTER — Ambulatory Visit (INDEPENDENT_AMBULATORY_CARE_PROVIDER_SITE_OTHER): Payer: Medicare Other | Admitting: Internal Medicine

## 2012-03-19 VITALS — BP 120/60 | HR 72 | Temp 98.3°F | Resp 16 | Wt 127.0 lb

## 2012-03-19 DIAGNOSIS — M545 Low back pain: Secondary | ICD-10-CM

## 2012-03-19 DIAGNOSIS — F329 Major depressive disorder, single episode, unspecified: Secondary | ICD-10-CM | POA: Diagnosis not present

## 2012-03-19 DIAGNOSIS — E890 Postprocedural hypothyroidism: Secondary | ICD-10-CM | POA: Diagnosis not present

## 2012-03-19 MED ORDER — FUROSEMIDE 40 MG PO TABS: 40.0000 mg | ORAL_TABLET | Freq: Every day | ORAL | Status: DC

## 2012-03-19 MED ORDER — OXYCODONE HCL 80 MG PO TB12: 80.0000 mg | ORAL_TABLET | Freq: Four times a day (QID) | ORAL | Status: DC

## 2012-03-19 MED ORDER — OXYCODONE HCL 40 MG PO TB12: 40.0000 mg | ORAL_TABLET | Freq: Four times a day (QID) | ORAL | Status: DC | PRN

## 2012-03-19 MED ORDER — POTASSIUM CHLORIDE CRYS ER 20 MEQ PO TBCR: 20.0000 meq | EXTENDED_RELEASE_TABLET | Freq: Every day | ORAL | Status: DC

## 2012-03-19 NOTE — Assessment & Plan Note (Signed)
Continue with current prescription therapy as reflected on the Med list.  

## 2012-03-19 NOTE — Progress Notes (Signed)
   Subjective:    Patient ID: Abigail Wilson, female    DOB: 1960-12-09, 51 y.o.   MRN: [id number]  HPI  The patient presents for a follow-up of  chronic hypertension, chronic dyslipidemia, type 2 diabetes controlled with medicines  BP Readings from Last 3 Encounters:  03/19/12 120/60  12/09/11 110/66  09/23/11 100/60   BP Readings from Last 3 Encounters:  03/19/12 120/60  12/09/11 110/66  09/23/11 100/60        Review of Systems  Constitutional: Negative for fever, chills, diaphoresis, activity change, appetite change, fatigue and unexpected weight change.  HENT: Negative for hearing loss, ear pain, congestion, sore throat, sneezing, mouth sores, neck pain, dental problem, voice change, postnasal drip and sinus pressure.   Eyes: Negative for pain and visual disturbance.  Respiratory: Negative for cough, chest tightness, wheezing and stridor.   Cardiovascular: Negative for chest pain, palpitations and leg swelling.  Gastrointestinal: Negative for nausea, vomiting, abdominal pain, blood in stool, abdominal distention and rectal pain.  Genitourinary: Negative for dysuria, hematuria, decreased urine volume, vaginal bleeding, vaginal discharge, difficulty urinating, vaginal pain and menstrual problem.  Musculoskeletal: Positive for back pain. Negative for joint swelling and gait problem.  Skin: Negative for color change, rash and wound.  Neurological: Negative for dizziness, tremors, syncope, speech difficulty and light-headedness.  Hematological: Negative for adenopathy.  Psychiatric/Behavioral: Negative for suicidal ideas, hallucinations, behavioral problems, confusion, disturbed wake/sleep cycle, dysphoric mood and decreased concentration. The patient is not hyperactive.        Objective:   Physical Exam  Constitutional: She appears well-developed. No distress.  HENT:  Head: Normocephalic.  Right Ear: External ear normal.  Left Ear: External ear normal.  Nose: Nose  normal.  Mouth/Throat: Oropharynx is clear and moist.  Eyes: Conjunctivae are normal. Pupils are equal, round, and reactive to light. Right eye exhibits no discharge. Left eye exhibits no discharge.  Neck: Normal range of motion. Neck supple. No JVD present. No tracheal deviation present. No thyromegaly present.  Cardiovascular: Normal rate, regular rhythm and normal heart sounds.   Pulmonary/Chest: No stridor. No respiratory distress. She has no wheezes.  Abdominal: Soft. Bowel sounds are normal. She exhibits no distension and no mass. There is no tenderness. There is no rebound and no guarding.  Musculoskeletal: She exhibits tenderness (LS is tender w/ROM and palpation). She exhibits no edema.  Lymphadenopathy:    She has no cervical adenopathy.  Neurological: She displays normal reflexes. No cranial nerve deficit. She exhibits normal muscle tone. Coordination normal.  Skin: No rash noted. No erythema.  Psychiatric: She has a normal mood and affect. Her behavior is normal. Judgment and thought content normal.    Lab Results  Component Value Date   WBC 5.4 01/08/2012   HGB 12.7 01/08/2012   HCT 37.3 01/08/2012   PLT 182.0 01/08/2012   GLUCOSE 84 01/08/2012   CHOL 168 01/08/2012   TRIG 50.0 01/08/2012   HDL 89.60 01/08/2012   LDLCALC 68 01/08/2012   ALT 16 01/08/2012   AST 28 01/08/2012   NA 138 01/08/2012   K 4.3 01/08/2012   CL 104 01/08/2012   CREATININE 0.8 01/08/2012   BUN 17 01/08/2012   CO2 28 01/08/2012   TSH 4.33 01/08/2012         Assessment & Plan:

## 2012-03-19 NOTE — Assessment & Plan Note (Signed)
Doing well 

## 2012-06-22 ENCOUNTER — Telehealth: Payer: Self-pay | Admitting: Internal Medicine

## 2012-06-22 MED ORDER — OXYCODONE HCL 80 MG PO TB12: 80.0000 mg | ORAL_TABLET | Freq: Four times a day (QID) | ORAL | Status: DC

## 2012-06-22 MED ORDER — OXYCODONE HCL 40 MG PO TB12: 40.0000 mg | ORAL_TABLET | Freq: Four times a day (QID) | ORAL | Status: DC | PRN

## 2012-06-22 NOTE — Telephone Encounter (Signed)
Rxs ready. Pt informed

## 2012-06-22 NOTE — Telephone Encounter (Signed)
Patient needs both of her oxycontin refilled, call when ready to pick up

## 2012-06-22 NOTE — Telephone Encounter (Signed)
Pt called again.  Needs both oxycodones by tomorrow.

## 2012-06-22 NOTE — Telephone Encounter (Signed)
OK to fill this prescription with additional refills x0 Thank you!  

## 2012-07-02 ENCOUNTER — Ambulatory Visit (INDEPENDENT_AMBULATORY_CARE_PROVIDER_SITE_OTHER): Payer: Medicare Other | Admitting: Internal Medicine

## 2012-07-02 ENCOUNTER — Encounter: Payer: Self-pay | Admitting: Internal Medicine

## 2012-07-02 VITALS — BP 118/70 | HR 80 | Temp 98.1°F | Resp 16 | Wt 127.0 lb

## 2012-07-02 DIAGNOSIS — M545 Low back pain: Secondary | ICD-10-CM | POA: Diagnosis not present

## 2012-07-02 DIAGNOSIS — Z23 Encounter for immunization: Secondary | ICD-10-CM | POA: Diagnosis not present

## 2012-07-02 DIAGNOSIS — R031 Nonspecific low blood-pressure reading: Secondary | ICD-10-CM | POA: Diagnosis not present

## 2012-07-02 DIAGNOSIS — E890 Postprocedural hypothyroidism: Secondary | ICD-10-CM | POA: Diagnosis not present

## 2012-07-02 MED ORDER — OXYCODONE HCL 40 MG PO TB12: 40.0000 mg | ORAL_TABLET | Freq: Four times a day (QID) | ORAL | Status: DC | PRN

## 2012-07-02 MED ORDER — OXYCODONE HCL 80 MG PO TB12: 80.0000 mg | ORAL_TABLET | Freq: Four times a day (QID) | ORAL | Status: DC

## 2012-07-02 NOTE — Assessment & Plan Note (Signed)
Continue with current prescription therapy as reflected on the Med list.  

## 2012-07-02 NOTE — Progress Notes (Signed)
   Subjective:    Patient ID: Abigail Wilson, female    DOB: 09/27/1960, 51 y.o.   MRN: [id number]  HPI  The patient presents for a follow-up of  chronic LBP, , COPD, hypothyroidismcontrolled with medicines   BP Readings from Last 3 Encounters:  07/02/12 118/70  03/19/12 120/60  12/09/11 110/66   BP Readings from Last 3 Encounters:  07/02/12 118/70  03/19/12 120/60  12/09/11 110/66        Review of Systems  Constitutional: Negative for fever, chills, diaphoresis, activity change, appetite change, fatigue and unexpected weight change.  HENT: Negative for hearing loss, ear pain, congestion, sore throat, sneezing, mouth sores, neck pain, dental problem, voice change, postnasal drip and sinus pressure.   Eyes: Negative for pain and visual disturbance.  Respiratory: Negative for cough, chest tightness, wheezing and stridor.   Cardiovascular: Negative for chest pain, palpitations and leg swelling.  Gastrointestinal: Negative for nausea, vomiting, abdominal pain, blood in stool, abdominal distention and rectal pain.  Genitourinary: Negative for dysuria, hematuria, decreased urine volume, vaginal bleeding, vaginal discharge, difficulty urinating, vaginal pain and menstrual problem.  Musculoskeletal: Positive for back pain. Negative for joint swelling and gait problem.  Skin: Negative for color change, rash and wound.  Neurological: Negative for dizziness, tremors, syncope, speech difficulty and light-headedness.  Hematological: Negative for adenopathy.  Psychiatric/Behavioral: Negative for suicidal ideas, hallucinations, behavioral problems, confusion, disturbed wake/sleep cycle, dysphoric mood and decreased concentration. The patient is not hyperactive.        Objective:   Physical Exam  Constitutional: She appears well-developed. No distress.  HENT:  Head: Normocephalic.  Right Ear: External ear normal.  Left Ear: External ear normal.  Nose: Nose normal.  Mouth/Throat:  Oropharynx is clear and moist.  Eyes: Conjunctivae normal are normal. Pupils are equal, round, and reactive to light. Right eye exhibits no discharge. Left eye exhibits no discharge.  Neck: Normal range of motion. Neck supple. No JVD present. No tracheal deviation present. No thyromegaly present.  Cardiovascular: Normal rate, regular rhythm and normal heart sounds.   Pulmonary/Chest: No stridor. No respiratory distress. She has no wheezes.  Abdominal: Soft. Bowel sounds are normal. She exhibits no distension and no mass. There is no tenderness. There is no rebound and no guarding.  Musculoskeletal: She exhibits tenderness (LS is tender w/ROM and palpation). She exhibits no edema.  Lymphadenopathy:    She has no cervical adenopathy.  Neurological: She displays normal reflexes. No cranial nerve deficit. She exhibits normal muscle tone. Coordination normal.  Skin: No rash noted. No erythema.  Psychiatric: She has a normal mood and affect. Her behavior is normal. Judgment and thought content normal.    Lab Results  Component Value Date   WBC 5.4 01/08/2012   HGB 12.7 01/08/2012   HCT 37.3 01/08/2012   PLT 182.0 01/08/2012   GLUCOSE 84 01/08/2012   CHOL 168 01/08/2012   TRIG 50.0 01/08/2012   HDL 89.60 01/08/2012   LDLCALC 68 01/08/2012   ALT 16 01/08/2012   AST 28 01/08/2012   NA 138 01/08/2012   K 4.3 01/08/2012   CL 104 01/08/2012   CREATININE 0.8 01/08/2012   BUN 17 01/08/2012   CO2 28 01/08/2012   TSH 4.33 01/08/2012         Assessment & Plan:

## 2012-08-22 ENCOUNTER — Other Ambulatory Visit: Payer: Self-pay | Admitting: Internal Medicine

## 2012-08-23 ENCOUNTER — Other Ambulatory Visit: Payer: Self-pay | Admitting: Internal Medicine

## 2012-09-03 ENCOUNTER — Telehealth: Payer: Self-pay | Admitting: Internal Medicine

## 2012-09-03 MED ORDER — AZITHROMYCIN 250 MG PO TABS: ORAL_TABLET | ORAL | Status: DC

## 2012-09-03 NOTE — Telephone Encounter (Signed)
Zpac 250 or 500mg ?

## 2012-09-03 NOTE — Telephone Encounter (Signed)
Ok Zpac Thx 

## 2012-09-03 NOTE — Telephone Encounter (Signed)
Patient Information:  Caller Name: Willow  Phone: [phone]  Patient: Elnoria Howard  Gender: Female  DOB: 13-Jan-1961  Age: 51 Years  PCP: Illene Regulus (Adults only)  Pregnant: No  Office Follow Up:  Does the office need to follow up with this patient?: Yes  Instructions For The Office: She declined appt said that they will ususally call her in  a Rx and that is what she is requesting  RN Note:  3 day hx of sore throat and earache on right side and cough productive yellow sputum.  No fever.  Symptoms  Reason For Call & Symptoms: earache and sorethroat on right side  x 3 days   Also productive cough yellow  Reviewed Health History In EMR: Yes  Reviewed Medications In EMR: Yes  Reviewed Allergies In EMR: Yes  Reviewed Surgeries / Procedures: Yes  Date of Onset of Symptoms: 08/31/2012 OB / GYN:  LMP: Unknown  Guideline(s) Used:  Sore Throat  Disposition Per Guideline:   See Today in Office  Reason For Disposition Reached:   Earache also present  Advice Given:  N/A  Patient Refused Recommendation:  Patient Requests Prescription  she wants Rx called in  doesn't want to be seen

## 2012-09-03 NOTE — Telephone Encounter (Signed)
250 mg -- done Thx

## 2012-09-06 ENCOUNTER — Ambulatory Visit (INDEPENDENT_AMBULATORY_CARE_PROVIDER_SITE_OTHER): Payer: Medicare Other | Admitting: Internal Medicine

## 2012-09-06 ENCOUNTER — Encounter: Payer: Self-pay | Admitting: Internal Medicine

## 2012-09-06 VITALS — BP 100/62 | HR 72 | Temp 98.1°F | Resp 16 | Wt 127.0 lb

## 2012-09-06 DIAGNOSIS — J209 Acute bronchitis, unspecified: Secondary | ICD-10-CM | POA: Diagnosis not present

## 2012-09-06 DIAGNOSIS — J449 Chronic obstructive pulmonary disease, unspecified: Secondary | ICD-10-CM | POA: Diagnosis not present

## 2012-09-06 DIAGNOSIS — J4489 Other specified chronic obstructive pulmonary disease: Secondary | ICD-10-CM

## 2012-09-06 DIAGNOSIS — M545 Low back pain, unspecified: Secondary | ICD-10-CM

## 2012-09-06 MED ORDER — OXYCODONE HCL 40 MG PO TB12: 40.0000 mg | ORAL_TABLET | Freq: Four times a day (QID) | ORAL | Status: DC | PRN

## 2012-09-06 MED ORDER — PREDNISONE 10 MG PO TABS: ORAL_TABLET | ORAL | Status: DC

## 2012-09-06 MED ORDER — AMOXICILLIN-POT CLAVULANATE 875-125 MG PO TABS: 1.0000 | ORAL_TABLET | Freq: Two times a day (BID) | ORAL | Status: DC

## 2012-09-06 MED ORDER — OXYCODONE HCL 80 MG PO TB12: 80.0000 mg | ORAL_TABLET | Freq: Four times a day (QID) | ORAL | Status: DC

## 2012-09-06 NOTE — Assessment & Plan Note (Addendum)
Finished Zpack Start Augmentin

## 2012-09-06 NOTE — Assessment & Plan Note (Signed)
Proair prn Prednisone 10 mg: take 4 tabs a day x 3 days; then 3 tabs a day x 4 days; then 2 tabs a day x 4 days, then 1 tab a day x 6 days, then stop. Take pc.

## 2012-09-06 NOTE — Assessment & Plan Note (Signed)
Oxy Rx refilled.

## 2012-09-06 NOTE — Progress Notes (Signed)
Subjective:    Patient ID: Abigail Wilson, female    DOB: [birth year], 51 y.o.   MRN: [id number]  Sinusitis This is a new problem. The current episode started 1 to 4 weeks ago. The problem has been gradually worsening since onset. Associated symptoms include coughing. Pertinent negatives include no chills, congestion, diaphoresis, ear pain, neck pain, sinus pressure, sneezing or sore throat.  Cough The current episode started 1 to 4 weeks ago. The cough is productive of purulent sputum. Pertinent negatives include no chest pain, chills, ear pain, fever, postnasal drip, rash, sore throat or wheezing. She has tried a beta-agonist inhaler for the symptoms. Her past medical history is significant for COPD.    The patient presents for a follow-up of  chronic LBP, , COPD, hypothyroidismcontrolled with medicines   BP Readings from Last 3 Encounters:  09/06/12 100/62  07/02/12 118/70  03/19/12 120/60   BP Readings from Last 3 Encounters:  09/06/12 100/62  07/02/12 118/70  03/19/12 120/60        Review of Systems  Constitutional: Negative for fever, chills, diaphoresis, activity change, appetite change, fatigue and unexpected weight change.  HENT: Negative for hearing loss, ear pain, congestion, sore throat, sneezing, mouth sores, neck pain, dental problem, voice change, postnasal drip and sinus pressure.   Eyes: Negative for pain and visual disturbance.  Respiratory: Positive for cough. Negative for chest tightness, wheezing and stridor.   Cardiovascular: Negative for chest pain, palpitations and leg swelling.  Gastrointestinal: Negative for nausea, vomiting, abdominal pain, blood in stool, abdominal distention and rectal pain.  Genitourinary: Negative for dysuria, hematuria, decreased urine volume, vaginal bleeding, vaginal discharge, difficulty urinating, vaginal pain and menstrual problem.  Musculoskeletal: Positive for back pain. Negative for joint swelling and gait problem.   Skin: Negative for color change, rash and wound.  Neurological: Negative for dizziness, tremors, syncope, speech difficulty and light-headedness.  Hematological: Negative for adenopathy.  Psychiatric/Behavioral: Negative for suicidal ideas, hallucinations, behavioral problems, confusion, sleep disturbance, dysphoric mood and decreased concentration. The patient is not hyperactive.        Objective:   Physical Exam  Constitutional: She appears well-developed. No distress.  HENT:  Head: Normocephalic.  Right Ear: External ear normal.  Left Ear: External ear normal.  Nose: Nose normal.  Mouth/Throat: Oropharynx is clear and moist.  Eyes: Conjunctivae normal are normal. Pupils are equal, round, and reactive to light. Right eye exhibits no discharge. Left eye exhibits no discharge.  Neck: Normal range of motion. Neck supple. No JVD present. No tracheal deviation present. No thyromegaly present.  Cardiovascular: Normal rate, regular rhythm and normal heart sounds.   Pulmonary/Chest: No stridor. No respiratory distress. She has no wheezes.  Abdominal: Soft. Bowel sounds are normal. She exhibits no distension and no mass. There is no tenderness. There is no rebound and no guarding.  Musculoskeletal: She exhibits tenderness (LS is tender w/ROM and palpation). She exhibits no edema.  Lymphadenopathy:    She has no cervical adenopathy.  Neurological: She displays normal reflexes. No cranial nerve deficit. She exhibits normal muscle tone. Coordination normal.  Skin: No rash noted. No erythema.  Psychiatric: She has a normal mood and affect. Her behavior is normal. Judgment and thought content normal.    Lab Results  Component Value Date   WBC 5.4 01/08/2012   HGB 12.7 01/08/2012   HCT 37.3 01/08/2012   PLT 182.0 01/08/2012   GLUCOSE 84 01/08/2012   CHOL 168 01/08/2012   TRIG 50.0 01/08/2012  HDL 89.60 01/08/2012   LDLCALC 68 01/08/2012   ALT 16 01/08/2012   AST 28 01/08/2012   NA 138 01/08/2012    K 4.3 01/08/2012   CL 104 01/08/2012   CREATININE 0.8 01/08/2012   BUN 17 01/08/2012   CO2 28 01/08/2012   TSH 4.33 01/08/2012         Assessment & Plan:

## 2012-09-17 ENCOUNTER — Ambulatory Visit: Payer: Medicare Other | Admitting: Internal Medicine

## 2012-10-20 ENCOUNTER — Encounter: Payer: Self-pay | Admitting: Internal Medicine

## 2012-10-20 ENCOUNTER — Ambulatory Visit (INDEPENDENT_AMBULATORY_CARE_PROVIDER_SITE_OTHER): Payer: Medicare Other | Admitting: Internal Medicine

## 2012-10-20 ENCOUNTER — Other Ambulatory Visit (INDEPENDENT_AMBULATORY_CARE_PROVIDER_SITE_OTHER): Payer: Medicare Other

## 2012-10-20 VITALS — BP 110/78 | HR 80 | Temp 97.7°F | Resp 16 | Wt 123.0 lb

## 2012-10-20 DIAGNOSIS — J449 Chronic obstructive pulmonary disease, unspecified: Secondary | ICD-10-CM | POA: Diagnosis not present

## 2012-10-20 DIAGNOSIS — E890 Postprocedural hypothyroidism: Secondary | ICD-10-CM | POA: Diagnosis not present

## 2012-10-20 DIAGNOSIS — M545 Low back pain: Secondary | ICD-10-CM | POA: Diagnosis not present

## 2012-10-20 DIAGNOSIS — F329 Major depressive disorder, single episode, unspecified: Secondary | ICD-10-CM

## 2012-10-20 LAB — TSH: TSH: 3.72 u[IU]/mL (ref 0.35–5.50)

## 2012-10-20 MED ORDER — OXYCODONE HCL 80 MG PO TB12: 80.0000 mg | ORAL_TABLET | Freq: Four times a day (QID) | ORAL | Status: DC

## 2012-10-20 MED ORDER — OXYCODONE HCL 40 MG PO TB12: 40.0000 mg | ORAL_TABLET | Freq: Four times a day (QID) | ORAL | Status: DC | PRN

## 2012-10-20 NOTE — Assessment & Plan Note (Signed)
Continue with current prescription therapy as reflected on the Med list.  

## 2012-10-20 NOTE — Assessment & Plan Note (Addendum)
Continue with current prescription therapy as reflected on the Med  We discussed tobacco smoking

## 2012-10-20 NOTE — Progress Notes (Signed)
   Subjective:    HPI  The patient presents for a follow-up of  chronic LBP, COPD, hypothyroidismcontrolled with medicines Her father had a hip fx  BP Readings from Last 3 Encounters:  10/20/12 110/78  09/06/12 100/62  07/02/12 118/70   BP Readings from Last 3 Encounters:  10/20/12 110/78  09/06/12 100/62  07/02/12 118/70    Review of Systems  Constitutional: Negative for fever, chills, diaphoresis, activity change, appetite change, fatigue and unexpected weight change.  HENT: Negative for hearing loss, ear pain, congestion, sore throat, sneezing, mouth sores, neck pain, dental problem, voice change, postnasal drip and sinus pressure.   Eyes: Negative for pain and visual disturbance.  Respiratory: Negative for cough, chest tightness, wheezing and stridor.   Cardiovascular: Negative for chest pain, palpitations and leg swelling.  Gastrointestinal: Negative for nausea, vomiting, abdominal pain, blood in stool, abdominal distention and rectal pain.  Genitourinary: Negative for dysuria, hematuria, decreased urine volume, vaginal bleeding, vaginal discharge, difficulty urinating, vaginal pain and menstrual problem.  Musculoskeletal: Positive for back pain. Negative for joint swelling and gait problem.  Skin: Negative for color change, rash and wound.  Neurological: Negative for dizziness, tremors, syncope, speech difficulty and light-headedness.  Hematological: Negative for adenopathy.  Psychiatric/Behavioral: Negative for suicidal ideas, hallucinations, behavioral problems, confusion, sleep disturbance, dysphoric mood and decreased concentration. The patient is not hyperactive.        Objective:   Physical Exam  Constitutional: She appears well-developed. No distress.  HENT:  Head: Normocephalic.  Right Ear: External ear normal.  Left Ear: External ear normal.  Nose: Nose normal.  Mouth/Throat: Oropharynx is clear and moist.  Eyes: Conjunctivae normal are normal. Pupils are  equal, round, and reactive to light. Right eye exhibits no discharge. Left eye exhibits no discharge.  Neck: Normal range of motion. Neck supple. No JVD present. No tracheal deviation present. No thyromegaly present.  Cardiovascular: Normal rate, regular rhythm and normal heart sounds.   Pulmonary/Chest: No stridor. No respiratory distress. She has no wheezes.  Abdominal: Soft. Bowel sounds are normal. She exhibits no distension and no mass. There is no tenderness. There is no rebound and no guarding.  Musculoskeletal: She exhibits tenderness (LS is tender w/ROM and palpation). She exhibits no edema.  Lymphadenopathy:    She has no cervical adenopathy.  Neurological: She displays normal reflexes. No cranial nerve deficit. She exhibits normal muscle tone. Coordination normal.  Skin: No rash noted. No erythema.  Psychiatric: She has a normal mood and affect. Her behavior is normal. Judgment and thought content normal.    Lab Results  Component Value Date   WBC 5.4 01/08/2012   HGB 12.7 01/08/2012   HCT 37.3 01/08/2012   PLT 182.0 01/08/2012   GLUCOSE 84 01/08/2012   CHOL 168 01/08/2012   TRIG 50.0 01/08/2012   HDL 89.60 01/08/2012   LDLCALC 68 01/08/2012   ALT 16 01/08/2012   AST 28 01/08/2012   NA 138 01/08/2012   K 4.3 01/08/2012   CL 104 01/08/2012   CREATININE 0.8 01/08/2012   BUN 17 01/08/2012   CO2 28 01/08/2012   TSH 4.33 01/08/2012        Assessment & Plan:

## 2012-10-20 NOTE — Assessment & Plan Note (Signed)
-   doing well

## 2012-11-20 ENCOUNTER — Encounter: Payer: Self-pay | Admitting: Family Medicine

## 2012-11-20 ENCOUNTER — Ambulatory Visit (INDEPENDENT_AMBULATORY_CARE_PROVIDER_SITE_OTHER): Payer: Medicare Other | Admitting: Family Medicine

## 2012-11-20 VITALS — BP 98/64 | HR 57 | Temp 97.2°F | Wt 124.0 lb

## 2012-11-20 DIAGNOSIS — J209 Acute bronchitis, unspecified: Secondary | ICD-10-CM

## 2012-11-20 MED ORDER — ALBUTEROL SULFATE HFA 108 (90 BASE) MCG/ACT IN AERS: 2.0000 | INHALATION_SPRAY | Freq: Four times a day (QID) | RESPIRATORY_TRACT | Status: DC | PRN

## 2012-11-20 MED ORDER — PREDNISONE 20 MG PO TABS: ORAL_TABLET | ORAL | Status: DC

## 2012-11-20 NOTE — Progress Notes (Signed)
OFFICE NOTE  11/20/2012  CC:  Chief Complaint  Patient presents with  . Pt here with c/o sinus infection x 5 days/ ab     HPI: Patient is a 52 y.o. Caucasian female who is here for 5 d of nasal stuffiness/mucous, coughing, gradually worsening.  No fevers.  No HA but ST and stuffy ears.  Question of slight wheezing intermittently.  Albuterol use a couple of times--helps a little.  Mornings are worst. Mucinex 600mg , 2 q12h, ibuprofen prn.    ROS: no chest pain, no dizziness, no palpitations, no LE edema  Pertinent PMH:  Past Medical History  Diagnosis Date  . Osteoporosis   . Hepatitis C   . OA (osteoarthritis of spine)   . Menopause   . Asthmatic bronchitis     Mild  . LBP (low back pain)   . OA (osteoarthritis)   . Depression   . Hyperthyroidism     s/p 131Iodine Rx 2007  . Hypothyroidism    No past surgical history on file.  MEDS:  Outpatient Prescriptions Prior to Visit  Medication Sig Dispense Refill  . albuterol (PROAIR HFA) 108 (90 BASE) MCG/ACT inhaler Inhale 2 puffs into the lungs 4 (four) times daily as needed.  1 Inhaler  6  . aspirin 81 MG EC tablet Take 81 mg by mouth daily.        . Cholecalciferol (VITAMIN D3) 1000 UNITS tablet Take 1,000 Units by mouth daily.        [name] COENZYME Q-10 PO Take by mouth.        . furosemide (LASIX) 40 MG tablet TAKE 1 TABLET BY MOUTH EVERY DAY  30 tablet  4  . KLOR-CON M20 20 MEQ tablet TAKE 1 TABLET BY MOUTH EVERY DAY  30 tablet  4  . Multiple Minerals-Vitamins (CALCIUM CITRATE +) TABS Take 1 tablet by mouth daily.        [name] oxyCODONE (OXYCONTIN) 40 MG 12 hr tablet Take 1 tablet (40 mg total) by mouth 4 (four) times daily as needed for pain. Fill on or after 5/9, 2014  120 tablet  0  . oxyCODONE (OXYCONTIN) 80 MG 12 hr tablet Take 1 tablet (80 mg total) by mouth 4 (four) times daily. Fill on or after 5/9, 2014  120 tablet  0  . potassium chloride SA (KLOR-CON M20) 20 MEQ tablet Take 1 tablet (20 mEq total) by mouth daily.  30  tablet  11  . predniSONE (DELTASONE) 10 MG tablet Prednisone 10 mg: take 4 tabs a day x 3 days; then 3 tabs a day x 4 days; then 2 tabs a day x 4 days, then 1 tab a day x 6 days, then stop. Take pc.  38 tablet  1  . triamcinolone (NASACORT) 55 MCG/ACT nasal inhaler Place 2 sprays into the nose daily as needed. For rhinitis.  1 Inhaler  11   No facility-administered medications prior to visit.  **Not taking the prednisone listed above  PE: Blood pressure 98/64, pulse 57, temperature 97.2 F (36.2 C), temperature source Oral, weight 124 lb (56.246 kg), SpO2 97.00%. VS: noted--normal. Gen: alert, NAD, NONTOXIC APPEARING. HEENT: eyes without injection, drainage, or swelling.  Ears: EACs clear, TMs with normal light reflex and landmarks.  Nose: Clear rhinorrhea, with some dried, crusty exudate adherent to mildly injected mucosa.  No purulent d/c.  No paranasal sinus TTP.  No facial swelling.  Throat and mouth without focal lesion.  No pharyngial swelling, erythema, or exudate.  Neck: supple, no LAD.   LUNGS: CTA bilat, nonlabored resps.   CV: RRR, no m/r/g. EXT: no c/c/e SKIN: no rash  IMPRESSION AND PLAN:  BRONCHITIS, ACUTE She has a viral URI with mild bronchitis.   No wheezing here.  Will treat with 40mg  prednisone qd x 5d, continue ProAir HFA (RF'd today), and continue mucinex but make sure it is with DM. Signs/symptoms to call or return for were reviewed and pt expressed understanding.    An After Visit Summary was printed and given to the patient.  FOLLOW UP:  prn

## 2012-11-20 NOTE — Assessment & Plan Note (Signed)
She has a viral URI with mild bronchitis.   No wheezing here.  Will treat with 40mg  prednisone qd x 5d, continue ProAir HFA (RF'd today), and continue mucinex but make sure it is with DM. Signs/symptoms to call or return for were reviewed and pt expressed understanding.

## 2012-12-29 ENCOUNTER — Telehealth: Payer: Self-pay | Admitting: Internal Medicine

## 2012-12-29 NOTE — Telephone Encounter (Signed)
Patient Information:  Caller Name: Dejanique  Phone: [phone]  Patient: Abigail Wilson, Abigail Wilson  Gender: Female  DOB: 09/27/1960  Age: 52 Years  PCP: Jeani Hawking  Pregnant: No  Office Follow Up:  Does the office need to follow up with this patient?: No  Instructions For The Office: N/A  RN Note:  Patient found tick on lower left leg below the bend of the knee. Tick removed completely.  Area affected is about 1/2 inch in size and red with some itching.  Patient washed and cleaned area when found.  Triaged with care advice given.  No emergent symptoms assessed.  Patient has appointment with her provider on 01/03/13-just wanted to follow up prior to this due to the bite.  Symptoms  Reason For Call & Symptoms: Tick bite --on patient less than 24 hours.  Reviewed Health History In EMR: Yes  Reviewed Medications In EMR: Yes  Reviewed Allergies In EMR: Yes  Reviewed Surgeries / Procedures: Yes  Date of Onset of Symptoms: 12/28/2012  Treatments Tried: Washed and cleaned area.  Treatments Tried Worked: Yes OB / GYN:  LMP: Unknown  Guideline(s) Used:  Tick Bite  Disposition Per Guideline:   Home Care  Reason For Disposition Reached:   Tick bite with no complications  Advice Given:  Expected Course:  Tick bites normally do not itch or hurt. That is why they often go unnoticed.  Call Back If:  Fever or rash occur in the next 2 weeks  Bite begins to look infected  You become worse.  Antibiotic Ointment:  Wash the wound and your hands with soap and water after removal to prevent catching any tick disease. Apply an over-the-counter antibiotic ointment (e.g., bacitracin) to the bite once.  Patient Will Follow Care Advice:  YES

## 2012-12-29 NOTE — Telephone Encounter (Signed)
Agree. Thx 

## 2013-01-03 ENCOUNTER — Ambulatory Visit (INDEPENDENT_AMBULATORY_CARE_PROVIDER_SITE_OTHER): Payer: Medicare Other | Admitting: Internal Medicine

## 2013-01-03 ENCOUNTER — Encounter: Payer: Self-pay | Admitting: Internal Medicine

## 2013-01-03 VITALS — BP 90/70 | HR 76 | Temp 97.9°F | Resp 16 | Wt 120.0 lb

## 2013-01-03 DIAGNOSIS — M810 Age-related osteoporosis without current pathological fracture: Secondary | ICD-10-CM

## 2013-01-03 DIAGNOSIS — M545 Low back pain: Secondary | ICD-10-CM

## 2013-01-03 DIAGNOSIS — F329 Major depressive disorder, single episode, unspecified: Secondary | ICD-10-CM | POA: Diagnosis not present

## 2013-01-03 DIAGNOSIS — E890 Postprocedural hypothyroidism: Secondary | ICD-10-CM

## 2013-01-03 MED ORDER — OXYCODONE HCL 80 MG PO TB12: 80.0000 mg | ORAL_TABLET | Freq: Four times a day (QID) | ORAL | Status: DC

## 2013-01-03 MED ORDER — OXYCODONE HCL 40 MG PO TB12: 40.0000 mg | ORAL_TABLET | Freq: Four times a day (QID) | ORAL | Status: DC | PRN

## 2013-01-03 NOTE — Assessment & Plan Note (Signed)
Continue with current prescription therapy as reflected on the Med list.  

## 2013-01-03 NOTE — Assessment & Plan Note (Signed)
Vit d 

## 2013-01-03 NOTE — Assessment & Plan Note (Signed)
Doing well off rx 

## 2013-01-03 NOTE — Progress Notes (Signed)
   Subjective:    HPI  The patient presents for a follow-up of  chronic LBP, COPD, hypothyroidism - controlled with medicines Her father had a hip fx - he is back home now...  BP Readings from Last 3 Encounters:  01/03/13 90/70  11/20/12 98/64  10/20/12 110/78   BP Readings from Last 3 Encounters:  01/03/13 90/70  11/20/12 98/64  10/20/12 110/78    Review of Systems  Constitutional: Negative for fever, chills, diaphoresis, activity change, appetite change, fatigue and unexpected weight change.  HENT: Negative for hearing loss, ear pain, congestion, sore throat, sneezing, mouth sores, neck pain, dental problem, voice change, postnasal drip and sinus pressure.   Eyes: Negative for pain and visual disturbance.  Respiratory: Negative for cough, chest tightness, wheezing and stridor.   Cardiovascular: Negative for chest pain, palpitations and leg swelling.  Gastrointestinal: Negative for nausea, vomiting, abdominal pain, blood in stool, abdominal distention and rectal pain.  Genitourinary: Negative for dysuria, hematuria, decreased urine volume, vaginal bleeding, vaginal discharge, difficulty urinating, vaginal pain and menstrual problem.  Musculoskeletal: Positive for back pain. Negative for joint swelling and gait problem.  Skin: Negative for color change, rash and wound.  Neurological: Negative for dizziness, tremors, syncope, speech difficulty and light-headedness.  Hematological: Negative for adenopathy.  Psychiatric/Behavioral: Negative for suicidal ideas, hallucinations, behavioral problems, confusion, sleep disturbance, dysphoric mood and decreased concentration. The patient is not hyperactive.        Objective:   Physical Exam  Constitutional: She appears well-developed. No distress.  HENT:  Head: Normocephalic.  Right Ear: External ear normal.  Left Ear: External ear normal.  Nose: Nose normal.  Mouth/Throat: Oropharynx is clear and moist.  Eyes: Conjunctivae are  normal. Pupils are equal, round, and reactive to light. Right eye exhibits no discharge. Left eye exhibits no discharge.  Neck: Normal range of motion. Neck supple. No JVD present. No tracheal deviation present. No thyromegaly present.  Cardiovascular: Normal rate, regular rhythm and normal heart sounds.   Pulmonary/Chest: No stridor. No respiratory distress. She has no wheezes.  Abdominal: Soft. Bowel sounds are normal. She exhibits no distension and no mass. There is no tenderness. There is no rebound and no guarding.  Musculoskeletal: She exhibits tenderness (LS is tender w/ROM and palpation). She exhibits no edema.  Lymphadenopathy:    She has no cervical adenopathy.  Neurological: She displays normal reflexes. No cranial nerve deficit. She exhibits normal muscle tone. Coordination normal.  Skin: No rash noted. No erythema.  Psychiatric: She has a normal mood and affect. Her behavior is normal. Judgment and thought content normal.    Lab Results  Component Value Date   WBC 5.4 01/08/2012   HGB 12.7 01/08/2012   HCT 37.3 01/08/2012   PLT 182.0 01/08/2012   GLUCOSE 84 01/08/2012   CHOL 168 01/08/2012   TRIG 50.0 01/08/2012   HDL 89.60 01/08/2012   LDLCALC 68 01/08/2012   ALT 16 01/08/2012   AST 28 01/08/2012   NA 138 01/08/2012   K 4.3 01/08/2012   CL 104 01/08/2012   CREATININE 0.8 01/08/2012   BUN 17 01/08/2012   CO2 28 01/08/2012   TSH 3.72 10/20/2012        Assessment & Plan:

## 2013-01-17 ENCOUNTER — Ambulatory Visit: Payer: Medicare Other | Admitting: Internal Medicine

## 2013-01-18 ENCOUNTER — Other Ambulatory Visit: Payer: Self-pay | Admitting: Internal Medicine

## 2013-02-16 ENCOUNTER — Other Ambulatory Visit: Payer: Self-pay | Admitting: Internal Medicine

## 2013-04-13 DIAGNOSIS — H04129 Dry eye syndrome of unspecified lacrimal gland: Secondary | ICD-10-CM | POA: Diagnosis not present

## 2013-04-13 DIAGNOSIS — H2510 Age-related nuclear cataract, unspecified eye: Secondary | ICD-10-CM | POA: Diagnosis not present

## 2013-04-18 ENCOUNTER — Ambulatory Visit (INDEPENDENT_AMBULATORY_CARE_PROVIDER_SITE_OTHER): Payer: Medicare Other | Admitting: Internal Medicine

## 2013-04-18 ENCOUNTER — Encounter: Payer: Self-pay | Admitting: Internal Medicine

## 2013-04-18 ENCOUNTER — Other Ambulatory Visit (INDEPENDENT_AMBULATORY_CARE_PROVIDER_SITE_OTHER): Payer: Medicare Other

## 2013-04-18 VITALS — BP 108/70 | HR 80 | Temp 98.0°F | Resp 16 | Wt 121.0 lb

## 2013-04-18 DIAGNOSIS — M1990 Unspecified osteoarthritis, unspecified site: Secondary | ICD-10-CM

## 2013-04-18 DIAGNOSIS — E890 Postprocedural hypothyroidism: Secondary | ICD-10-CM

## 2013-04-18 DIAGNOSIS — J449 Chronic obstructive pulmonary disease, unspecified: Secondary | ICD-10-CM

## 2013-04-18 DIAGNOSIS — M545 Low back pain, unspecified: Secondary | ICD-10-CM

## 2013-04-18 DIAGNOSIS — J4489 Other specified chronic obstructive pulmonary disease: Secondary | ICD-10-CM

## 2013-04-18 LAB — BASIC METABOLIC PANEL
Calcium: 9.3 mg/dL (ref 8.4–10.5)
GFR: 55.14 mL/min — ABNORMAL LOW (ref >60.00)
Glucose, Bld: 76 mg/dL (ref 70–99)
Potassium: 4.2 mEq/L (ref 3.5–5.1)
Sodium: 138 mEq/L (ref 135–145)

## 2013-04-18 LAB — TSH: TSH: 5.77 u[IU]/mL — ABNORMAL HIGH (ref 0.35–5.50)

## 2013-04-18 LAB — CBC WITH DIFFERENTIAL/PLATELET
Basophils Relative: 0.4 % (ref 0.0–3.0)
Eosinophils Absolute: 0.1 10*3/uL (ref 0.0–0.7)
Eosinophils Relative: 2.3 % (ref 0.0–5.0)
Hemoglobin: 12.4 g/dL (ref 12.0–15.0)
Lymphocytes Relative: 26.6 % (ref 12.0–46.0)
MCHC: 33.7 g/dL (ref 30.0–36.0)
Monocytes Relative: 12.4 % — ABNORMAL HIGH (ref 3.0–12.0)
Neutro Abs: 3.5 10*3/uL (ref 1.4–7.7)
Neutrophils Relative %: 58.3 % (ref 43.0–77.0)
RBC: 3.78 Mil/uL — ABNORMAL LOW (ref 3.87–5.11)
WBC: 6 10*3/uL (ref 4.5–10.5)

## 2013-04-18 MED ORDER — OXYCODONE HCL 40 MG PO TB12: 40.0000 mg | ORAL_TABLET | Freq: Four times a day (QID) | ORAL | Status: DC | PRN

## 2013-04-18 MED ORDER — OXYCODONE HCL 80 MG PO TB12: 80.0000 mg | ORAL_TABLET | Freq: Four times a day (QID) | ORAL | Status: DC

## 2013-04-18 NOTE — Assessment & Plan Note (Signed)
Quit smoking. 

## 2013-04-18 NOTE — Assessment & Plan Note (Signed)
Continue with current prescription therapy as reflected on the Med list.  

## 2013-04-18 NOTE — Progress Notes (Signed)
  Subjective:     HPI   The patient presents for a follow-up of  Chronic LBP, COPD hypothyroidism controlled with medicines She stopped smoking, on e-cig now  Wt Readings from Last 3 Encounters:  04/18/13 121 lb (54.885 kg)  01/03/13 120 lb (54.432 kg)  11/20/12 124 lb (56.246 kg)   BP Readings from Last 3 Encounters:  04/18/13 108/70  01/03/13 90/70  11/20/12 98/64       Review of Systems  Constitutional: Negative for chills, activity change, appetite change, fatigue and unexpected weight change.  HENT: Negative for congestion, mouth sores and sinus pressure.   Eyes: Negative for visual disturbance.  Respiratory: Negative for cough and chest tightness.   Gastrointestinal: Negative for nausea and abdominal pain.  Genitourinary: Negative for frequency, difficulty urinating and vaginal pain.  Musculoskeletal: Positive for back pain. Negative for gait problem.  Skin: Negative for pallor and rash.  Neurological: Negative for dizziness, tremors, weakness, numbness and headaches.  Psychiatric/Behavioral: Negative for confusion, sleep disturbance and dysphoric mood. The patient is not nervous/anxious.        Objective:   Physical Exam  Constitutional: She appears well-developed. No distress.  HENT:  Head: Normocephalic.  Right Ear: External ear normal.  Left Ear: External ear normal.  Nose: Nose normal.  Mouth/Throat: Oropharynx is clear and moist.  Eyes: Conjunctivae are normal. Pupils are equal, round, and reactive to light. Right eye exhibits no discharge. Left eye exhibits no discharge.  Neck: Normal range of motion. Neck supple. No JVD present. No tracheal deviation present. No thyromegaly present.  Cardiovascular: Normal rate, regular rhythm and normal heart sounds.   Pulmonary/Chest: No stridor. No respiratory distress. She has no wheezes.  Abdominal: Soft. Bowel sounds are normal. She exhibits no distension and no mass. There is no tenderness. There is no rebound  and no guarding.  Musculoskeletal: She exhibits tenderness (LS is tender). She exhibits no edema.  Lymphadenopathy:    She has no cervical adenopathy.  Neurological: She displays normal reflexes. No cranial nerve deficit. She exhibits normal muscle tone. Coordination normal.  Skin: No rash noted. No erythema.  Psychiatric: She has a normal mood and affect. Her behavior is normal. Judgment and thought content normal.   Lab Results  Component Value Date   WBC 5.4 01/08/2012   HGB 12.7 01/08/2012   HCT 37.3 01/08/2012   PLT 182.0 01/08/2012   GLUCOSE 84 01/08/2012   CHOL 168 01/08/2012   TRIG 50.0 01/08/2012   HDL 89.60 01/08/2012   LDLCALC 68 01/08/2012   ALT 16 01/08/2012   AST 28 01/08/2012   NA 138 01/08/2012   K 4.3 01/08/2012   CL 104 01/08/2012   CREATININE 0.8 01/08/2012   BUN 17 01/08/2012   CO2 28 01/08/2012   TSH 3.72 10/20/2012          Assessment & Plan:

## 2013-05-04 ENCOUNTER — Other Ambulatory Visit: Payer: Self-pay | Admitting: *Deleted

## 2013-05-04 DIAGNOSIS — E890 Postprocedural hypothyroidism: Secondary | ICD-10-CM

## 2013-06-29 ENCOUNTER — Other Ambulatory Visit: Payer: Self-pay | Admitting: Internal Medicine

## 2013-07-19 ENCOUNTER — Ambulatory Visit (INDEPENDENT_AMBULATORY_CARE_PROVIDER_SITE_OTHER): Payer: Medicare Other | Admitting: Internal Medicine

## 2013-07-19 ENCOUNTER — Other Ambulatory Visit: Payer: Self-pay | Admitting: *Deleted

## 2013-07-19 ENCOUNTER — Other Ambulatory Visit: Payer: Self-pay | Admitting: Internal Medicine

## 2013-07-19 ENCOUNTER — Encounter: Payer: Self-pay | Admitting: Internal Medicine

## 2013-07-19 VITALS — BP 100/60 | HR 80 | Temp 98.2°F | Resp 16 | Wt 121.0 lb

## 2013-07-19 DIAGNOSIS — F329 Major depressive disorder, single episode, unspecified: Secondary | ICD-10-CM | POA: Diagnosis not present

## 2013-07-19 DIAGNOSIS — J449 Chronic obstructive pulmonary disease, unspecified: Secondary | ICD-10-CM | POA: Diagnosis not present

## 2013-07-19 DIAGNOSIS — M545 Low back pain: Secondary | ICD-10-CM

## 2013-07-19 DIAGNOSIS — Z23 Encounter for immunization: Secondary | ICD-10-CM

## 2013-07-19 DIAGNOSIS — Z87891 Personal history of nicotine dependence: Secondary | ICD-10-CM

## 2013-07-19 MED ORDER — OXYCODONE HCL ER 80 MG PO T12A: 80.0000 mg | EXTENDED_RELEASE_TABLET | Freq: Four times a day (QID) | ORAL | Status: DC

## 2013-07-19 MED ORDER — OXYCODONE HCL ER 40 MG PO T12A: 40.0000 mg | EXTENDED_RELEASE_TABLET | Freq: Four times a day (QID) | ORAL | Status: DC

## 2013-07-19 MED ORDER — OXYCODONE HCL ER 40 MG PO T12A: 40.0000 mg | EXTENDED_RELEASE_TABLET | Freq: Two times a day (BID) | ORAL | Status: DC

## 2013-07-19 MED ORDER — OXYCODONE HCL ER 80 MG PO T12A: 80.0000 mg | EXTENDED_RELEASE_TABLET | Freq: Two times a day (BID) | ORAL | Status: DC

## 2013-07-19 NOTE — Progress Notes (Signed)
  Subjective:     HPI   The patient presents for a follow-up of  Chronic LBP, COPD hypothyroidism controlled with medicines She stopped smoking, on e-cig now   Wt Readings from Last 3 Encounters:  07/19/13 121 lb (54.885 kg)  04/18/13 121 lb (54.885 kg)  01/03/13 120 lb (54.432 kg)   BP Readings from Last 3 Encounters:  07/19/13 100/60  04/18/13 108/70  01/03/13 90/70       Review of Systems  Constitutional: Negative for chills, activity change, appetite change, fatigue and unexpected weight change.  HENT: Negative for congestion, mouth sores and sinus pressure.   Eyes: Negative for visual disturbance.  Respiratory: Negative for cough and chest tightness.   Gastrointestinal: Negative for nausea and abdominal pain.  Genitourinary: Negative for frequency, difficulty urinating and vaginal pain.  Musculoskeletal: Positive for back pain. Negative for gait problem.  Skin: Negative for pallor and rash.  Neurological: Negative for dizziness, tremors, weakness, numbness and headaches.  Psychiatric/Behavioral: Negative for confusion, sleep disturbance and dysphoric mood. The patient is not nervous/anxious.        Objective:   Physical Exam  Constitutional: She appears well-developed. No distress.  HENT:  Head: Normocephalic.  Right Ear: External ear normal.  Left Ear: External ear normal.  Nose: Nose normal.  Mouth/Throat: Oropharynx is clear and moist.  Eyes: Conjunctivae are normal. Pupils are equal, round, and reactive to light. Right eye exhibits no discharge. Left eye exhibits no discharge.  Neck: Normal range of motion. Neck supple. No JVD present. No tracheal deviation present. No thyromegaly present.  Cardiovascular: Normal rate, regular rhythm and normal heart sounds.   Pulmonary/Chest: No stridor. No respiratory distress. She has no wheezes.  Abdominal: Soft. Bowel sounds are normal. She exhibits no distension and no mass. There is no tenderness. There is no rebound  and no guarding.  Musculoskeletal: She exhibits tenderness (LS is tender). She exhibits no edema.  Lymphadenopathy:    She has no cervical adenopathy.  Neurological: She displays normal reflexes. No cranial nerve deficit. She exhibits normal muscle tone. Coordination normal.  Skin: No rash noted. No erythema.  Psychiatric: She has a normal mood and affect. Her behavior is normal. Judgment and thought content normal.   Lab Results  Component Value Date   WBC 6.0 04/18/2013   HGB 12.4 04/18/2013   HCT 36.9 04/18/2013   PLT 215.0 04/18/2013   GLUCOSE 76 04/18/2013   CHOL 168 01/08/2012   TRIG 50.0 01/08/2012   HDL 89.60 01/08/2012   LDLCALC 68 01/08/2012   ALT 16 01/08/2012   AST 28 01/08/2012   NA 138 04/18/2013   K 4.2 04/18/2013   CL 99 04/18/2013   CREATININE 1.1 04/18/2013   BUN 22 04/18/2013   CO2 32 04/18/2013   TSH 5.77* 04/18/2013          Assessment & Plan:

## 2013-07-19 NOTE — Assessment & Plan Note (Addendum)
Continue with current prescription therapy as reflected on the Med list prn On e cigs

## 2013-07-19 NOTE — Assessment & Plan Note (Signed)
2014 e cigs

## 2013-07-19 NOTE — Assessment & Plan Note (Signed)
Doing well 

## 2013-07-19 NOTE — Progress Notes (Signed)
Pre-visit discussion using our clinic review tool. No additional management support is needed unless otherwise documented below in the visit note.  

## 2013-07-19 NOTE — Assessment & Plan Note (Signed)
Continue with current prescription therapy as reflected on the Med list.  

## 2013-09-05 ENCOUNTER — Telehealth: Payer: Self-pay | Admitting: *Deleted

## 2013-09-05 NOTE — Telephone Encounter (Signed)
Pt called states she has began having fever, chills, coughing and sneezing mucus; further states OTC medications not effective.  Requests antibiotics called in.  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  Please advise

## 2013-09-05 NOTE — Telephone Encounter (Signed)
Ok Zpac Thx 

## 2013-09-06 MED ORDER — AZITHROMYCIN 250 MG PO TABS: ORAL_TABLET | ORAL | Status: DC

## 2013-09-06 NOTE — Telephone Encounter (Signed)
Left message on VM Rx sent 

## 2013-10-18 ENCOUNTER — Ambulatory Visit (INDEPENDENT_AMBULATORY_CARE_PROVIDER_SITE_OTHER): Payer: Medicare Other | Admitting: Internal Medicine

## 2013-10-18 ENCOUNTER — Encounter: Payer: Self-pay | Admitting: Internal Medicine

## 2013-10-18 VITALS — BP 130/62 | HR 76 | Temp 98.0°F | Resp 16 | Wt 118.0 lb

## 2013-10-18 DIAGNOSIS — M545 Low back pain, unspecified: Secondary | ICD-10-CM

## 2013-10-18 DIAGNOSIS — E890 Postprocedural hypothyroidism: Secondary | ICD-10-CM | POA: Diagnosis not present

## 2013-10-18 DIAGNOSIS — F329 Major depressive disorder, single episode, unspecified: Secondary | ICD-10-CM

## 2013-10-18 DIAGNOSIS — F3289 Other specified depressive episodes: Secondary | ICD-10-CM | POA: Diagnosis not present

## 2013-10-18 MED ORDER — OXYCODONE HCL ER 40 MG PO T12A: 40.0000 mg | EXTENDED_RELEASE_TABLET | Freq: Four times a day (QID) | ORAL | Status: DC

## 2013-10-18 MED ORDER — OXYCODONE HCL ER 80 MG PO T12A: 80.0000 mg | EXTENDED_RELEASE_TABLET | Freq: Four times a day (QID) | ORAL | Status: DC

## 2013-10-18 MED ORDER — OXYCODONE HCL ER 80 MG PO T12A
80.0000 mg | EXTENDED_RELEASE_TABLET | Freq: Four times a day (QID) | ORAL | Status: DC
Start: 2013-10-18 — End: 2014-01-18

## 2013-10-18 MED ORDER — TRIAMCINOLONE ACETONIDE 55 MCG/ACT NA AERO: 2.0000 | INHALATION_SPRAY | Freq: Every day | NASAL | Status: DC

## 2013-10-18 NOTE — Progress Notes (Signed)
   Subjective:     HPI   The patient presents for a follow-up of  Chronic LBP, COPD hypothyroidism controlled with medicines She stopped smoking, on e-cig now   Wt Readings from Last 3 Encounters:  10/18/13 118 lb (53.524 kg)  07/19/13 121 lb (54.885 kg)  04/18/13 121 lb (54.885 kg)   BP Readings from Last 3 Encounters:  10/18/13 130/62  07/19/13 100/60  04/18/13 108/70       Review of Systems  Constitutional: Negative for chills, activity change, appetite change, fatigue and unexpected weight change.  HENT: Negative for congestion, mouth sores and sinus pressure.   Eyes: Negative for visual disturbance.  Respiratory: Negative for cough and chest tightness.   Gastrointestinal: Negative for nausea and abdominal pain.  Genitourinary: Negative for frequency, difficulty urinating and vaginal pain.  Musculoskeletal: Positive for back pain. Negative for gait problem.  Skin: Negative for pallor and rash.  Neurological: Negative for dizziness, tremors, weakness, numbness and headaches.  Psychiatric/Behavioral: Negative for confusion, sleep disturbance and dysphoric mood. The patient is not nervous/anxious.        Objective:   Physical Exam  Constitutional: She appears well-developed. No distress.  HENT:  Head: Normocephalic.  Right Ear: External ear normal.  Left Ear: External ear normal.  Nose: Nose normal.  Mouth/Throat: Oropharynx is clear and moist.  Eyes: Conjunctivae are normal. Pupils are equal, round, and reactive to light. Right eye exhibits no discharge. Left eye exhibits no discharge.  Neck: Normal range of motion. Neck supple. No JVD present. No tracheal deviation present. No thyromegaly present.  Cardiovascular: Normal rate, regular rhythm and normal heart sounds.   Pulmonary/Chest: No stridor. No respiratory distress. She has no wheezes.  Abdominal: Soft. Bowel sounds are normal. She exhibits no distension and no mass. There is no tenderness. There is no  rebound and no guarding.  Musculoskeletal: She exhibits tenderness (LS is tender). She exhibits no edema.  Lymphadenopathy:    She has no cervical adenopathy.  Neurological: She displays normal reflexes. No cranial nerve deficit. She exhibits normal muscle tone. Coordination normal.  Skin: No rash noted. No erythema.  Psychiatric: She has a normal mood and affect. Her behavior is normal. Judgment and thought content normal.   Lab Results  Component Value Date   WBC 6.0 04/18/2013   HGB 12.4 04/18/2013   HCT 36.9 04/18/2013   PLT 215.0 04/18/2013   GLUCOSE 76 04/18/2013   CHOL 168 01/08/2012   TRIG 50.0 01/08/2012   HDL 89.60 01/08/2012   LDLCALC 68 01/08/2012   ALT 16 01/08/2012   AST 28 01/08/2012   NA 138 04/18/2013   K 4.2 04/18/2013   CL 99 04/18/2013   CREATININE 1.1 04/18/2013   BUN 22 04/18/2013   CO2 32 04/18/2013   TSH 5.77* 04/18/2013          Assessment & Plan:

## 2013-10-18 NOTE — Progress Notes (Signed)
Pre visit review using our clinic review tool, if applicable. No additional management support is needed unless otherwise documented below in the visit note. 

## 2013-10-19 NOTE — Assessment & Plan Note (Signed)
Continue with current prescription therapy as reflected on the Med list.  

## 2013-10-19 NOTE — Assessment & Plan Note (Signed)
November 02, 2013 father died - grief

## 2013-11-16 DIAGNOSIS — M25579 Pain in unspecified ankle and joints of unspecified foot: Secondary | ICD-10-CM | POA: Diagnosis not present

## 2013-11-21 ENCOUNTER — Telehealth: Payer: Self-pay | Admitting: Internal Medicine

## 2013-11-21 NOTE — Telephone Encounter (Signed)
Patient states that the podiatry office she was sent to was unable to remove the cyst from her foot because they cannot do a biopsy. She wants to know if Dr. Alain Marion could remove the cyst for her. Please advise.

## 2013-11-22 NOTE — Telephone Encounter (Signed)
I'll need to see it again Thx

## 2013-11-23 NOTE — Telephone Encounter (Signed)
Pt scheduled OV for 11/28/13 and made aware per MD he will look first and will remove cyst if he feels he can safely. Pt understands.

## 2013-11-28 ENCOUNTER — Encounter: Payer: Self-pay | Admitting: Internal Medicine

## 2013-11-28 ENCOUNTER — Ambulatory Visit (INDEPENDENT_AMBULATORY_CARE_PROVIDER_SITE_OTHER): Payer: Medicare Other | Admitting: Internal Medicine

## 2013-11-28 VITALS — BP 120/70 | HR 71 | Temp 98.1°F | Ht 65.0 in | Wt 121.8 lb

## 2013-11-28 DIAGNOSIS — R229 Localized swelling, mass and lump, unspecified: Secondary | ICD-10-CM | POA: Diagnosis not present

## 2013-11-28 DIAGNOSIS — R2240 Localized swelling, mass and lump, unspecified lower limb: Secondary | ICD-10-CM

## 2013-11-28 NOTE — Progress Notes (Signed)
   Subjective:    Patient ID: [name], female    DOB: [birth year], 53 y.o.   MRN: [id number]  HPI  C/o R 2nd toe growth x18 months, painful   Review of Systems     Objective:   Physical Exam  Musculoskeletal: She exhibits tenderness. She exhibits no edema.  Neurological: Coordination normal.  Skin: No rash noted.          Assessment & Plan:

## 2013-11-28 NOTE — Assessment & Plan Note (Signed)
F/u w/dr Blenda Mounts

## 2013-12-07 ENCOUNTER — Ambulatory Visit (INDEPENDENT_AMBULATORY_CARE_PROVIDER_SITE_OTHER): Payer: Medicare Other

## 2013-12-07 DIAGNOSIS — Q828 Other specified congenital malformations of skin: Secondary | ICD-10-CM | POA: Diagnosis not present

## 2013-12-07 DIAGNOSIS — R52 Pain, unspecified: Secondary | ICD-10-CM

## 2013-12-07 DIAGNOSIS — L84 Corns and callosities: Secondary | ICD-10-CM

## 2013-12-07 NOTE — Patient Instructions (Signed)

## 2013-12-07 NOTE — Progress Notes (Signed)
   Subjective:    Patient ID: [name], female    DOB: 01/14/1961, 53 y.o.   MRN: [id number]  HPI  PT STATED RT FOOT 2ND TOE HAVE A KNOT AND ITS BEEN HURTING 14 MONTHS. THE TOE IS ABOUT THE SAME AND IS NOT GETTING WORSE. THE TOE GET AGGRAVATED BY SHOES OR WHEN THE FOOT START SWEATING. TRIED NO TREATMENT.    Review of Systems  Eyes: Positive for visual disturbance.  Musculoskeletal: Positive for back pain and gait problem.  All other systems reviewed and are negative.       Objective:   Physical Exam Neurovascular status is intact pedal pulses palpable DP and PT +2/4 bilateral capillary refill Refill timed 3-4 seconds skin temperature warm turgor normal no edema rubor pallor or varicosities noted neurologically epicritic and proprioceptive sensations intact and symmetric there is normal plantar response DTRs not elicited dermatologically skin color pigment normal there is a nucleated keratotic lesion medial surfaces second digit right foot this is secondary to hammertoe deformity and slight bunion deformity with lateral deviation of the hallux rubbing against the second toe. There is no sign of infection no discharge drainage there is no other signs skin abnormality x-rays AP lateral oblique views demonstrate some prominence of the proximal IP joint second digit adjacent to the prominent IP joint of the hallux. No foreign body is identified at this time keratotic lesion is debrided down to a good pink dermal base there is no open wounds ulcerations noted no cyst or clear porokeratosis versus corn/callus to friction and pressure between the hallux and second digit.       Assessment & Plan:  Assessment porokeratosis/corn callus painful symptomatic secondary to friction and pressure this time lesions debrided tubercle padding was dispensed to keep the toes separated it continues may be candidate for ostectomy procedure or possible hammertoe repair some point future or bunion correction if  bunion became worse or more severe are ready feel conservative care and padding shoe as alleviate this problem. Followup in the future and as-needed basis for any recurrence or exacerbation next  Harriet Masson DPM

## 2014-01-17 ENCOUNTER — Other Ambulatory Visit: Payer: Self-pay | Admitting: Internal Medicine

## 2014-01-18 ENCOUNTER — Encounter: Payer: Self-pay | Admitting: Internal Medicine

## 2014-01-18 ENCOUNTER — Ambulatory Visit (INDEPENDENT_AMBULATORY_CARE_PROVIDER_SITE_OTHER): Payer: Medicare Other | Admitting: Internal Medicine

## 2014-01-18 VITALS — BP 110/70 | HR 80 | Temp 98.0°F | Resp 16 | Wt 119.0 lb

## 2014-01-18 DIAGNOSIS — F3289 Other specified depressive episodes: Secondary | ICD-10-CM | POA: Diagnosis not present

## 2014-01-18 DIAGNOSIS — M545 Low back pain, unspecified: Secondary | ICD-10-CM | POA: Diagnosis not present

## 2014-01-18 DIAGNOSIS — M1990 Unspecified osteoarthritis, unspecified site: Secondary | ICD-10-CM

## 2014-01-18 DIAGNOSIS — F329 Major depressive disorder, single episode, unspecified: Secondary | ICD-10-CM | POA: Diagnosis not present

## 2014-01-18 MED ORDER — OXYCODONE HCL ER 80 MG PO T12A: 80.0000 mg | EXTENDED_RELEASE_TABLET | Freq: Four times a day (QID) | ORAL | Status: DC

## 2014-01-18 MED ORDER — OXYCODONE HCL ER 40 MG PO T12A: 40.0000 mg | EXTENDED_RELEASE_TABLET | Freq: Four times a day (QID) | ORAL | Status: DC

## 2014-01-18 NOTE — Assessment & Plan Note (Signed)
Continue with current prescription therapy as reflected on the Med list.  

## 2014-01-18 NOTE — Assessment & Plan Note (Signed)
Doing fair Nov 12, 2013 father died - grief

## 2014-01-18 NOTE — Progress Notes (Signed)
Pre visit review using our clinic review tool, if applicable. No additional management support is needed unless otherwise documented below in the visit note. 

## 2014-02-19 ENCOUNTER — Other Ambulatory Visit: Payer: Self-pay | Admitting: Internal Medicine

## 2014-03-15 LAB — HM MAMMOGRAPHY

## 2014-03-31 ENCOUNTER — Ambulatory Visit (INDEPENDENT_AMBULATORY_CARE_PROVIDER_SITE_OTHER): Payer: Medicare Other | Admitting: Gynecology

## 2014-03-31 ENCOUNTER — Encounter: Payer: Self-pay | Admitting: Gynecology

## 2014-03-31 ENCOUNTER — Other Ambulatory Visit (HOSPITAL_COMMUNITY)
Admission: RE | Admit: 2014-03-31 | Discharge: 2014-03-31 | Disposition: A | Discharge status: Home / Self Care | Payer: Medicare Other | Source: Ambulatory Visit | Attending: Gynecology | Admitting: Gynecology

## 2014-03-31 VITALS — BP 112/70 | Ht 65.0 in | Wt 122.0 lb

## 2014-03-31 DIAGNOSIS — M899 Disorder of bone, unspecified: Secondary | ICD-10-CM | POA: Diagnosis not present

## 2014-03-31 DIAGNOSIS — Z01419 Encounter for gynecological examination (general) (routine) without abnormal findings: Secondary | ICD-10-CM

## 2014-03-31 DIAGNOSIS — Z1151 Encounter for screening for human papillomavirus (HPV): Secondary | ICD-10-CM | POA: Insufficient documentation

## 2014-03-31 DIAGNOSIS — N951 Menopausal and female climacteric states: Secondary | ICD-10-CM

## 2014-03-31 DIAGNOSIS — M8580 Other specified disorders of bone density and structure, unspecified site: Secondary | ICD-10-CM

## 2014-03-31 DIAGNOSIS — Z124 Encounter for screening for malignant neoplasm of cervix: Secondary | ICD-10-CM

## 2014-03-31 DIAGNOSIS — M949 Disorder of cartilage, unspecified: Secondary | ICD-10-CM

## 2014-03-31 DIAGNOSIS — Z780 Asymptomatic menopausal state: Secondary | ICD-10-CM

## 2014-03-31 NOTE — Patient Instructions (Signed)

## 2014-03-31 NOTE — Progress Notes (Signed)
Abigail Wilson 21-Jul-1961 [id number]   History:    53 y.o.  for annual gyn exam who is a new patient in the practice. 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 Patient has not had a gynecological exam in over 10 years. Patient states that she has never had any abnormal Pap smear. Patient was treated several years ago for hepatitis C. Patient's last mammogram was in 2009 and her last bone density study was in 2006 the bone density study that demonstrated that the lowest T score was at the left femoral neck with a 9-1.7 and a description of no significant change when compared with 2006. Patient reports normal colonoscopy in 2012 her PCP Dr.Plotnikov has been doing her blood work.   Past medical history,surgical history, family history and social history were all reviewed and documented in the EPIC chart.  Gynecologic History No LMP recorded. Patient is postmenopausal. Contraception: post menopausal status Last Pap: 10 years ago. Results were: normal Last mammogram: 2009. Results were: normal  Obstetric History OB History  Gravida Para Term Preterm AB SAB TAB Ectopic Multiple Living  4 2   2     2     # Outcome Date GA Lbr Len/2nd Weight Sex Delivery Anes PTL Lv  4 ABT           3 ABT           2 PAR           1 PAR                ROS: A ROS was performed and pertinent positives and negatives are included in the history.  GENERAL: No fevers or chills. HEENT: No change in vision, no earache, sore throat or sinus congestion. NECK: No pain or stiffness. CARDIOVASCULAR: No chest pain or pressure. No palpitations. PULMONARY: No shortness of breath, cough or wheeze. GASTROINTESTINAL: No abdominal pain, nausea, vomiting or diarrhea, melena or bright red blood per rectum. GENITOURINARY: No urinary frequency, urgency, hesitancy or dysuria. MUSCULOSKELETAL: No joint or muscle pain, no back pain, no recent trauma. DERMATOLOGIC: No rash, no itching, no lesions. ENDOCRINE: No polyuria,  polydipsia, no heat or cold intolerance. No recent change in weight. HEMATOLOGICAL: No anemia or easy bruising or bleeding. NEUROLOGIC: No headache, seizures, numbness, tingling or weakness. PSYCHIATRIC: No depression, no loss of interest in normal activity or change in sleep pattern.     Exam: chaperone present  BP 112/70  Ht 5\' 5"  (1.651 m)  Wt 122 lb (55.339 kg)  BMI 20.30 kg/m2  Body mass index is 20.3 kg/(m^2).  General appearance : Well developed well nourished female. No acute distress HEENT: Neck supple, trachea midline, no carotid bruits, no thyroidmegaly Lungs: Clear to auscultation, no rhonchi or wheezes, or rib retractions  Heart: Regular rate and rhythm, no murmurs or gallops Breast:Examined in sitting and supine position were symmetrical in appearance, no palpable masses or tenderness,  no skin retraction, no nipple inversion, no nipple discharge, no skin discoloration, no axillary or supraclavicular lymphadenopathy Abdomen: no palpable masses or tenderness, no rebound or guarding Extremities: no edema or skin discoloration or tenderness  Pelvic:  Bartholin, Urethra, Skene Glands: Within normal limits             Vagina: No gross lesions or discharge, atrophic changes  Cervix: No gross lesions or discharge  Uterus  anteverted, normal size, shape and consistency, non-tender and mobile  Adnexa  Without masses or tenderness  Anus and perineum  normal   Rectovaginal  refused exam             Hemoccult cards provided     Assessment/Plan:  53 y.o. female for annual exam menopausal has never been on HRT. Minimal vasomotor symptoms. Not sexually active. Past history of hepatitis C. Patient was provided with a requisition to schedule her mammogram as well as her bone density study. We discussed importance of calcium and vitamin D and regular exercise for osteoporosis prevention. Pap smear was done today with HPV screen. She was reminded to submit to the office the Hemoccult  cards for testing since she refused rectal exam.  Note: This dictation was prepared with  Dragon/digital dictation along withSmart phrase technology. Any transcriptional errors that result from this process are unintentional.   Terrance Mass MD, 2:41 PM 03/31/2014

## 2014-04-03 ENCOUNTER — Other Ambulatory Visit: Payer: Self-pay

## 2014-04-03 DIAGNOSIS — Z1231 Encounter for screening mammogram for malignant neoplasm of breast: Secondary | ICD-10-CM

## 2014-04-04 LAB — CYTOLOGY - PAP

## 2014-04-05 DIAGNOSIS — H5319 Other subjective visual disturbances: Secondary | ICD-10-CM | POA: Diagnosis not present

## 2014-04-05 DIAGNOSIS — H2510 Age-related nuclear cataract, unspecified eye: Secondary | ICD-10-CM | POA: Diagnosis not present

## 2014-04-18 ENCOUNTER — Ambulatory Visit
Admission: RE | Admit: 2014-04-18 | Discharge: 2014-04-18 | Disposition: A | Discharge status: Home / Self Care | Payer: Medicare Other | Source: Ambulatory Visit

## 2014-04-18 DIAGNOSIS — Z1231 Encounter for screening mammogram for malignant neoplasm of breast: Secondary | ICD-10-CM | POA: Diagnosis not present

## 2014-04-18 LAB — HM MAMMOGRAPHY

## 2014-04-19 ENCOUNTER — Encounter: Payer: Self-pay | Admitting: Internal Medicine

## 2014-04-19 ENCOUNTER — Ambulatory Visit (INDEPENDENT_AMBULATORY_CARE_PROVIDER_SITE_OTHER): Payer: Medicare Other | Admitting: Internal Medicine

## 2014-04-19 VITALS — BP 110/60 | HR 58 | Temp 97.6°F | Wt 124.0 lb

## 2014-04-19 DIAGNOSIS — M545 Low back pain, unspecified: Secondary | ICD-10-CM | POA: Diagnosis not present

## 2014-04-19 DIAGNOSIS — F3289 Other specified depressive episodes: Secondary | ICD-10-CM | POA: Diagnosis not present

## 2014-04-19 DIAGNOSIS — E890 Postprocedural hypothyroidism: Secondary | ICD-10-CM

## 2014-04-19 DIAGNOSIS — F329 Major depressive disorder, single episode, unspecified: Secondary | ICD-10-CM | POA: Diagnosis not present

## 2014-04-19 MED ORDER — OXYCODONE HCL ER 40 MG PO T12A: 40.0000 mg | EXTENDED_RELEASE_TABLET | Freq: Four times a day (QID) | ORAL | Status: DC

## 2014-04-19 MED ORDER — TRIAMCINOLONE ACETONIDE 55 MCG/ACT NA AERO: 2.0000 | INHALATION_SPRAY | Freq: Every day | NASAL | Status: DC

## 2014-04-19 MED ORDER — OXYCODONE HCL ER 80 MG PO T12A: 80.0000 mg | EXTENDED_RELEASE_TABLET | Freq: Four times a day (QID) | ORAL | Status: DC

## 2014-04-19 NOTE — Assessment & Plan Note (Signed)
Doing ok.

## 2014-04-19 NOTE — Progress Notes (Signed)
Pre visit review using our clinic review tool, if applicable. No additional management support is needed unless otherwise documented below in the visit note. 

## 2014-04-19 NOTE — Assessment & Plan Note (Signed)
Continue with current prescription therapy as reflected on the Med list.  

## 2014-07-06 ENCOUNTER — Other Ambulatory Visit: Payer: Self-pay | Admitting: Internal Medicine

## 2014-07-17 ENCOUNTER — Encounter: Payer: Self-pay | Admitting: Internal Medicine

## 2014-07-17 ENCOUNTER — Ambulatory Visit (INDEPENDENT_AMBULATORY_CARE_PROVIDER_SITE_OTHER): Payer: Medicare Other | Admitting: Internal Medicine

## 2014-07-17 VITALS — BP 110/66 | HR 69 | Temp 98.3°F | Wt 126.0 lb

## 2014-07-17 DIAGNOSIS — Z23 Encounter for immunization: Secondary | ICD-10-CM | POA: Diagnosis not present

## 2014-07-17 MED ORDER — OXYCODONE HCL ER 40 MG PO T12A: 40.0000 mg | EXTENDED_RELEASE_TABLET | Freq: Four times a day (QID) | ORAL | Status: DC

## 2014-07-17 MED ORDER — OXYCODONE HCL ER 80 MG PO T12A: 80.0000 mg | EXTENDED_RELEASE_TABLET | Freq: Four times a day (QID) | ORAL | Status: DC

## 2014-07-17 NOTE — Progress Notes (Deleted)
Pre visit review using our clinic review tool, if applicable. No additional management support is needed unless otherwise documented below in the visit note. 

## 2014-07-17 NOTE — Progress Notes (Signed)
Patient ID: Abigail Wilson, female   DOB: 12/22/1960, 53 y.o.   MRN: [id number]   Subjective:     HPI   The patient presents for a follow-up of  Chronic LBP, COPD hypothyroidism controlled with medicines She stopped smoking, on e-cig now   Wt Readings from Last 3 Encounters:  07/17/14 126 lb (57.153 kg)  04/19/14 124 lb (56.246 kg)  03/31/14 122 lb (55.339 kg)   BP Readings from Last 3 Encounters:  07/17/14 110/66  04/19/14 110/60  03/31/14 112/70       Review of Systems  Constitutional: Negative for chills, activity change, appetite change, fatigue and unexpected weight change.  HENT: Negative for congestion, mouth sores and sinus pressure.   Eyes: Negative for visual disturbance.  Respiratory: Negative for cough and chest tightness.   Gastrointestinal: Negative for nausea and abdominal pain.  Genitourinary: Negative for frequency, difficulty urinating and vaginal pain.  Musculoskeletal: Positive for back pain. Negative for gait problem.  Skin: Negative for pallor and rash.  Neurological: Negative for dizziness, tremors, weakness, numbness and headaches.  Psychiatric/Behavioral: Negative for confusion, sleep disturbance and dysphoric mood. The patient is not nervous/anxious.        Objective:   Physical Exam  Constitutional: She appears well-developed. No distress.  HENT:  Head: Normocephalic.  Right Ear: External ear normal.  Left Ear: External ear normal.  Nose: Nose normal.  Mouth/Throat: Oropharynx is clear and moist.  Eyes: Conjunctivae are normal. Pupils are equal, round, and reactive to light. Right eye exhibits no discharge. Left eye exhibits no discharge.  Neck: Normal range of motion. Neck supple. No JVD present. No tracheal deviation present. No thyromegaly present.  Cardiovascular: Normal rate, regular rhythm and normal heart sounds.   Pulmonary/Chest: No stridor. No respiratory distress. She has no wheezes.  Abdominal: Soft. Bowel sounds are  normal. She exhibits no distension and no mass. There is no tenderness. There is no rebound and no guarding.  Musculoskeletal: She exhibits no edema or tenderness.  Lymphadenopathy:    She has no cervical adenopathy.  Neurological: She displays normal reflexes. No cranial nerve deficit. She exhibits normal muscle tone. Coordination normal.  Skin: No rash noted. No erythema.  Psychiatric: She has a normal mood and affect. Her behavior is normal. Judgment and thought content normal.   Lab Results  Component Value Date   WBC 6.0 04/18/2013   HGB 12.4 04/18/2013   HCT 36.9 04/18/2013   PLT 215.0 04/18/2013   GLUCOSE 76 04/18/2013   CHOL 168 01/08/2012   TRIG 50.0 01/08/2012   HDL 89.60 01/08/2012   LDLCALC 68 01/08/2012   ALT 16 01/08/2012   AST 28 01/08/2012   NA 138 04/18/2013   K 4.2 04/18/2013   CL 99 04/18/2013   CREATININE 1.1 04/18/2013   BUN 22 04/18/2013   CO2 32 04/18/2013   TSH 5.77* 04/18/2013          Assessment & Plan:

## 2014-08-15 ENCOUNTER — Other Ambulatory Visit: Payer: Self-pay | Admitting: Internal Medicine

## 2014-10-02 ENCOUNTER — Ambulatory Visit (INDEPENDENT_AMBULATORY_CARE_PROVIDER_SITE_OTHER): Payer: Medicare Other | Admitting: Internal Medicine

## 2014-10-02 ENCOUNTER — Encounter: Payer: Self-pay | Admitting: Internal Medicine

## 2014-10-02 ENCOUNTER — Other Ambulatory Visit (INDEPENDENT_AMBULATORY_CARE_PROVIDER_SITE_OTHER): Payer: Medicare Other

## 2014-10-02 VITALS — BP 110/78 | HR 67 | Temp 98.1°F | Wt 127.0 lb

## 2014-10-02 DIAGNOSIS — E038 Other specified hypothyroidism: Secondary | ICD-10-CM

## 2014-10-02 DIAGNOSIS — Z0000 Encounter for general adult medical examination without abnormal findings: Secondary | ICD-10-CM

## 2014-10-02 DIAGNOSIS — E034 Atrophy of thyroid (acquired): Secondary | ICD-10-CM

## 2014-10-02 DIAGNOSIS — D1724 Benign lipomatous neoplasm of skin and subcutaneous tissue of left leg: Secondary | ICD-10-CM

## 2014-10-02 DIAGNOSIS — M545 Low back pain, unspecified: Secondary | ICD-10-CM

## 2014-10-02 DIAGNOSIS — Z23 Encounter for immunization: Secondary | ICD-10-CM | POA: Diagnosis not present

## 2014-10-02 LAB — URINALYSIS
Bilirubin Urine: NEGATIVE
KETONES UR: NEGATIVE
Leukocytes, UA: NEGATIVE
Nitrite: NEGATIVE
PH: 6 (ref 5.0–8.0)
Specific Gravity, Urine: 1.02 (ref 1.000–1.030)
Total Protein, Urine: NEGATIVE
Urine Glucose: NEGATIVE
Urobilinogen, UA: 0.2 (ref 0.0–1.0)

## 2014-10-02 LAB — HEPATIC FUNCTION PANEL
ALT: 20 U/L (ref 0–35)
AST: 34 U/L (ref 0–37)
Albumin: 4.4 g/dL (ref 3.5–5.2)
Alkaline Phosphatase: 45 U/L (ref 39–117)
BILIRUBIN TOTAL: 0.4 mg/dL (ref 0.2–1.2)
Bilirubin, Direct: 0.1 mg/dL (ref 0.0–0.3)
Total Protein: 7.4 g/dL (ref 6.0–8.3)

## 2014-10-02 LAB — LIPID PANEL
CHOLESTEROL: 210 mg/dL — AB (ref 0–200)
HDL: 104.9 mg/dL (ref >39.00)
LDL Cholesterol: 93 mg/dL (ref 0–99)
NonHDL: 105.1
Total CHOL/HDL Ratio: 2
Triglycerides: 61 mg/dL (ref 0.0–149.0)
VLDL: 12.2 mg/dL (ref 0.0–40.0)

## 2014-10-02 LAB — CBC WITH DIFFERENTIAL/PLATELET
Basophils Absolute: 0 10*3/uL (ref 0.0–0.1)
Basophils Relative: 0.5 % (ref 0.0–3.0)
EOS ABS: 0.1 10*3/uL (ref 0.0–0.7)
EOS PCT: 1.9 % (ref 0.0–5.0)
HEMATOCRIT: 39.3 % (ref 36.0–46.0)
HEMOGLOBIN: 13.1 g/dL (ref 12.0–15.0)
Lymphocytes Relative: 38.2 % (ref 12.0–46.0)
Lymphs Abs: 1.9 10*3/uL (ref 0.7–4.0)
MCHC: 33.2 g/dL (ref 30.0–36.0)
MCV: 98.9 fl (ref 78.0–100.0)
MONO ABS: 0.5 10*3/uL (ref 0.1–1.0)
Monocytes Relative: 10.6 % (ref 3.0–12.0)
NEUTROS PCT: 48.8 % (ref 43.0–77.0)
Neutro Abs: 2.4 10*3/uL (ref 1.4–7.7)
Platelets: 236 10*3/uL (ref 150.0–400.0)
RBC: 3.98 Mil/uL (ref 3.87–5.11)
RDW: 13.6 % (ref 11.5–15.5)
WBC: 4.9 10*3/uL (ref 4.0–10.5)

## 2014-10-02 LAB — BASIC METABOLIC PANEL
BUN: 21 mg/dL (ref 6–23)
CO2: 33 mEq/L — ABNORMAL HIGH (ref 19–32)
CREATININE: 0.96 mg/dL (ref 0.40–1.20)
Calcium: 9.9 mg/dL (ref 8.4–10.5)
Chloride: 98 mEq/L (ref 96–112)
GFR: 64.17 mL/min (ref >60.00)
GLUCOSE: 99 mg/dL (ref 70–99)
POTASSIUM: 3.9 meq/L (ref 3.5–5.1)
SODIUM: 136 meq/L (ref 135–145)

## 2014-10-02 LAB — TSH: TSH: 8.89 u[IU]/mL — ABNORMAL HIGH (ref 0.35–4.50)

## 2014-10-02 LAB — T4, FREE: FREE T4: 0.54 ng/dL — AB (ref 0.60–1.60)

## 2014-10-02 MED ORDER — OXYCODONE HCL ER 40 MG PO T12A: 40.0000 mg | EXTENDED_RELEASE_TABLET | Freq: Four times a day (QID) | ORAL | Status: DC

## 2014-10-02 MED ORDER — OXYCODONE HCL ER 80 MG PO T12A: 80.0000 mg | EXTENDED_RELEASE_TABLET | Freq: Four times a day (QID) | ORAL | Status: DC

## 2014-10-02 MED ORDER — LEVOTHYROXINE SODIUM 25 MCG PO TABS: 25.0000 ug | ORAL_TABLET | Freq: Every day | ORAL | Status: DC

## 2014-10-02 NOTE — Progress Notes (Signed)
   Subjective:     HPI   The patient presents for a follow-up of  Chronic LBP, COPD hypothyroidism controlled with medicines She stopped smoking, on e-cig now   Wt Readings from Last 3 Encounters:  10/02/14 127 lb (57.607 kg)  07/17/14 126 lb (57.153 kg)  04/19/14 124 lb (56.246 kg)   BP Readings from Last 3 Encounters:  10/02/14 110/78  07/17/14 110/66  04/19/14 110/60     Review of Systems  Constitutional: Negative for chills, activity change, appetite change, fatigue and unexpected weight change.  HENT: Negative for congestion, mouth sores and sinus pressure.   Eyes: Negative for visual disturbance.  Respiratory: Negative for cough and chest tightness.   Gastrointestinal: Negative for nausea and abdominal pain.  Genitourinary: Negative for frequency, difficulty urinating and vaginal pain.  Musculoskeletal: Positive for back pain. Negative for gait problem.  Skin: Negative for pallor and rash.  Neurological: Negative for dizziness, tremors, weakness, numbness and headaches.  Psychiatric/Behavioral: Negative for confusion, sleep disturbance and dysphoric mood. The patient is not nervous/anxious.        Objective:   Physical Exam  Constitutional: She appears well-developed. No distress.  HENT:  Head: Normocephalic.  Right Ear: External ear normal.  Left Ear: External ear normal.  Nose: Nose normal.  Mouth/Throat: Oropharynx is clear and moist.  Eyes: Conjunctivae are normal. Pupils are equal, round, and reactive to light. Right eye exhibits no discharge. Left eye exhibits no discharge.  Neck: Normal range of motion. Neck supple. No JVD present. No tracheal deviation present. No thyromegaly present.  Cardiovascular: Normal rate, regular rhythm and normal heart sounds.   Pulmonary/Chest: No stridor. No respiratory distress. She has no wheezes.  Abdominal: Soft. Bowel sounds are normal. She exhibits no distension and no mass. There is no tenderness. There is no rebound  and no guarding.  Musculoskeletal: She exhibits no edema or tenderness.  Lymphadenopathy:    She has no cervical adenopathy.  Neurological: She displays normal reflexes. No cranial nerve deficit. She exhibits normal muscle tone. Coordination normal.  Skin: No rash noted. No erythema.  Psychiatric: She has a normal mood and affect. Her behavior is normal. Judgment and thought content normal.  L dist aner thigh ?lipoma 3x3 cm flat   Lab Results  Component Value Date   WBC 6.0 04/18/2013   HGB 12.4 04/18/2013   HCT 36.9 04/18/2013   PLT 215.0 04/18/2013   GLUCOSE 76 04/18/2013   CHOL 168 01/08/2012   TRIG 50.0 01/08/2012   HDL 89.60 01/08/2012   LDLCALC 68 01/08/2012   ALT 16 01/08/2012   AST 28 01/08/2012   NA 138 04/18/2013   K 4.2 04/18/2013   CL 99 04/18/2013   CREATININE 1.1 04/18/2013   BUN 22 04/18/2013   CO2 32 04/18/2013   TSH 5.77* 04/18/2013          Assessment & Plan:  Patient ID: Abigail Wilson, female   DOB: [birth year], 54 y.o.   MRN: [id number]

## 2014-10-02 NOTE — Progress Notes (Signed)
Pre visit review using our clinic review tool, if applicable. No additional management support is needed unless otherwise documented below in the visit note. 

## 2014-10-02 NOTE — Assessment & Plan Note (Addendum)
Start Levothyroxine 25 mcg/d

## 2014-10-02 NOTE — Assessment & Plan Note (Signed)
Bx if needed

## 2014-10-02 NOTE — Assessment & Plan Note (Signed)
Continue with current prescription therapy as reflected on the Med list.  

## 2014-10-03 ENCOUNTER — Other Ambulatory Visit: Payer: Self-pay | Admitting: Internal Medicine

## 2014-11-02 ENCOUNTER — Telehealth: Payer: Self-pay | Admitting: *Deleted

## 2014-11-02 DIAGNOSIS — E039 Hypothyroidism, unspecified: Secondary | ICD-10-CM

## 2014-11-02 NOTE — Telephone Encounter (Signed)
Pt states she started thyroid medicine in January 2016. Recently her BP has been elevated around 140/80, which is elevated for her since hers is lower than normal. She wants to know if this is a side effect of levothyroxine. Please advise.

## 2014-11-03 ENCOUNTER — Other Ambulatory Visit (INDEPENDENT_AMBULATORY_CARE_PROVIDER_SITE_OTHER): Payer: Medicare Other

## 2014-11-03 DIAGNOSIS — E039 Hypothyroidism, unspecified: Secondary | ICD-10-CM | POA: Diagnosis not present

## 2014-11-03 LAB — TSH: TSH: 7.67 u[IU]/mL — AB (ref 0.35–4.50)

## 2014-11-03 LAB — T4, FREE: Free T4: 0.61 ng/dL (ref 0.60–1.60)

## 2014-11-03 NOTE — Telephone Encounter (Signed)
I'm not sure. Come for a TSH, FT4 check Thx

## 2014-11-03 NOTE — Telephone Encounter (Signed)
Labs ordered. Pt informed

## 2014-11-04 ENCOUNTER — Other Ambulatory Visit: Payer: Self-pay | Admitting: Internal Medicine

## 2014-11-04 MED ORDER — LEVOTHYROXINE SODIUM 25 MCG PO TABS: 50.0000 ug | ORAL_TABLET | Freq: Every day | ORAL | Status: DC

## 2015-01-02 ENCOUNTER — Encounter: Payer: Self-pay | Admitting: Internal Medicine

## 2015-01-02 ENCOUNTER — Ambulatory Visit (INDEPENDENT_AMBULATORY_CARE_PROVIDER_SITE_OTHER): Payer: Medicare Other | Admitting: Internal Medicine

## 2015-01-02 ENCOUNTER — Other Ambulatory Visit (INDEPENDENT_AMBULATORY_CARE_PROVIDER_SITE_OTHER): Payer: Medicare Other

## 2015-01-02 VITALS — BP 110/78 | HR 69 | Ht 65.0 in | Wt 119.0 lb

## 2015-01-02 DIAGNOSIS — E038 Other specified hypothyroidism: Secondary | ICD-10-CM

## 2015-01-02 DIAGNOSIS — J449 Chronic obstructive pulmonary disease, unspecified: Secondary | ICD-10-CM

## 2015-01-02 DIAGNOSIS — Z0000 Encounter for general adult medical examination without abnormal findings: Secondary | ICD-10-CM | POA: Diagnosis not present

## 2015-01-02 DIAGNOSIS — M545 Low back pain, unspecified: Secondary | ICD-10-CM

## 2015-01-02 DIAGNOSIS — D1724 Benign lipomatous neoplasm of skin and subcutaneous tissue of left leg: Secondary | ICD-10-CM | POA: Diagnosis not present

## 2015-01-02 DIAGNOSIS — Z23 Encounter for immunization: Secondary | ICD-10-CM | POA: Diagnosis not present

## 2015-01-02 DIAGNOSIS — R634 Abnormal weight loss: Secondary | ICD-10-CM

## 2015-01-02 DIAGNOSIS — E034 Atrophy of thyroid (acquired): Secondary | ICD-10-CM

## 2015-01-02 LAB — CBC WITH DIFFERENTIAL/PLATELET
BASOS PCT: 0.5 % (ref 0.0–3.0)
Basophils Absolute: 0 10*3/uL (ref 0.0–0.1)
Eosinophils Absolute: 0 10*3/uL (ref 0.0–0.7)
Eosinophils Relative: 1 % (ref 0.0–5.0)
HEMATOCRIT: 37 % (ref 36.0–46.0)
Hemoglobin: 13 g/dL (ref 12.0–15.0)
LYMPHS ABS: 1.3 10*3/uL (ref 0.7–4.0)
Lymphocytes Relative: 35 % (ref 12.0–46.0)
MCHC: 35 g/dL (ref 30.0–36.0)
MCV: 95.9 fl (ref 78.0–100.0)
MONOS PCT: 11.6 % (ref 3.0–12.0)
Monocytes Absolute: 0.4 10*3/uL (ref 0.1–1.0)
NEUTROS ABS: 2 10*3/uL (ref 1.4–7.7)
Neutrophils Relative %: 51.9 % (ref 43.0–77.0)
Platelets: 221 10*3/uL (ref 150.0–400.0)
RBC: 3.86 Mil/uL — AB (ref 3.87–5.11)
RDW: 14.2 % (ref 11.5–15.5)
WBC: 3.9 10*3/uL — ABNORMAL LOW (ref 4.0–10.5)

## 2015-01-02 LAB — URINALYSIS
BILIRUBIN URINE: NEGATIVE
HGB URINE DIPSTICK: NEGATIVE
Ketones, ur: NEGATIVE
Leukocytes, UA: NEGATIVE
NITRITE: NEGATIVE
Specific Gravity, Urine: 1.02 (ref 1.000–1.030)
Total Protein, Urine: NEGATIVE
UROBILINOGEN UA: 0.2 (ref 0.0–1.0)
Urine Glucose: NEGATIVE
pH: 6.5 (ref 5.0–8.0)

## 2015-01-02 LAB — BASIC METABOLIC PANEL
BUN: 14 mg/dL (ref 6–23)
CO2: 34 mEq/L — ABNORMAL HIGH (ref 19–32)
CREATININE: 0.76 mg/dL (ref 0.40–1.20)
Calcium: 9.7 mg/dL (ref 8.4–10.5)
Chloride: 99 mEq/L (ref 96–112)
GFR: 83.94 mL/min (ref >60.00)
Glucose, Bld: 101 mg/dL — ABNORMAL HIGH (ref 70–99)
Potassium: 4.2 mEq/L (ref 3.5–5.1)
Sodium: 137 mEq/L (ref 135–145)

## 2015-01-02 LAB — LIPID PANEL
CHOL/HDL RATIO: 2
Cholesterol: 203 mg/dL — ABNORMAL HIGH (ref 0–200)
HDL: 114.8 mg/dL (ref >39.00)
LDL CALC: 79 mg/dL (ref 0–99)
NonHDL: 88.2
Triglycerides: 46 mg/dL (ref 0.0–149.0)
VLDL: 9.2 mg/dL (ref 0.0–40.0)

## 2015-01-02 LAB — HEPATIC FUNCTION PANEL
ALT: 14 U/L (ref 0–35)
AST: 27 U/L (ref 0–37)
Albumin: 4.4 g/dL (ref 3.5–5.2)
Alkaline Phosphatase: 50 U/L (ref 39–117)
BILIRUBIN DIRECT: 0.1 mg/dL (ref 0.0–0.3)
BILIRUBIN TOTAL: 0.4 mg/dL (ref 0.2–1.2)
TOTAL PROTEIN: 7.2 g/dL (ref 6.0–8.3)

## 2015-01-02 LAB — T4, FREE: Free T4: 0.66 ng/dL (ref 0.60–1.60)

## 2015-01-02 LAB — TSH: TSH: 1.99 u[IU]/mL (ref 0.35–4.50)

## 2015-01-02 MED ORDER — OXYCODONE HCL ER 40 MG PO T12A: 40.0000 mg | EXTENDED_RELEASE_TABLET | Freq: Four times a day (QID) | ORAL | Status: DC

## 2015-01-02 MED ORDER — OXYCODONE HCL ER 80 MG PO T12A: 80.0000 mg | EXTENDED_RELEASE_TABLET | Freq: Four times a day (QID) | ORAL | Status: DC

## 2015-01-02 NOTE — Assessment & Plan Note (Signed)
Doing well 

## 2015-01-02 NOTE — Assessment & Plan Note (Signed)
Chronic Oxycodone/Oxycontin Rx - long term  Potential benefits of a long term opioids use as well as potential risks (i.e. addiction risk, apnea etc) and complications (i.e. Somnolence, constipation and others) were explained to the patient and were aknowledged. 

## 2015-01-02 NOTE — Assessment & Plan Note (Signed)
Wt Readings from Last 3 Encounters:  01/02/15 119 lb (53.978 kg)  10/02/14 127 lb (57.607 kg)  07/17/14 126 lb (57.153 kg)  Labs

## 2015-01-02 NOTE — Assessment & Plan Note (Signed)

## 2015-01-02 NOTE — Assessment & Plan Note (Signed)
On Levothroid 

## 2015-01-02 NOTE — Progress Notes (Signed)
Pre visit review using our clinic review tool, if applicable. No additional management support is needed unless otherwise documented below in the visit note. 

## 2015-01-02 NOTE — Progress Notes (Signed)
   Subjective:     HPI  The patient is here for a wellness exam. The patient has been doing well overall without major physical or psychological issues going on lately.  The patient presents for a follow-up of  Chronic LBP, COPD hypothyroidism controlled with medicines.  She stopped smoking, on e-cig now   Wt Readings from Last 3 Encounters:  01/02/15 119 lb (53.978 kg)  10/02/14 127 lb (57.607 kg)  07/17/14 126 lb (57.153 kg)   BP Readings from Last 3 Encounters:  01/02/15 110/78  10/02/14 110/78  07/17/14 110/66     Review of Systems  Constitutional: Negative for chills, activity change, appetite change, fatigue and unexpected weight change.  HENT: Negative for congestion, mouth sores and sinus pressure.   Eyes: Negative for visual disturbance.  Respiratory: Negative for cough and chest tightness.   Gastrointestinal: Negative for nausea and abdominal pain.  Genitourinary: Negative for frequency, difficulty urinating and vaginal pain.  Musculoskeletal: Positive for back pain. Negative for gait problem.  Skin: Negative for pallor and rash.  Neurological: Negative for dizziness, tremors, weakness, numbness and headaches.  Psychiatric/Behavioral: Negative for confusion, sleep disturbance and dysphoric mood. The patient is not nervous/anxious.        Objective:   Physical Exam  Constitutional: She appears well-developed. No distress.  HENT:  Head: Normocephalic.  Right Ear: External ear normal.  Left Ear: External ear normal.  Nose: Nose normal.  Mouth/Throat: Oropharynx is clear and moist.  Eyes: Conjunctivae are normal. Pupils are equal, round, and reactive to light. Right eye exhibits no discharge. Left eye exhibits no discharge.  Neck: Normal range of motion. Neck supple. No JVD present. No tracheal deviation present. No thyromegaly present.  Cardiovascular: Normal rate, regular rhythm and normal heart sounds.   Pulmonary/Chest: No stridor. No respiratory distress.  She has no wheezes.  Abdominal: Soft. Bowel sounds are normal. She exhibits no distension and no mass. There is no tenderness. There is no rebound and no guarding.  Musculoskeletal: She exhibits no edema or tenderness.  Lymphadenopathy:    She has no cervical adenopathy.  Neurological: She displays normal reflexes. No cranial nerve deficit. She exhibits normal muscle tone. Coordination normal.  Skin: No rash noted. No erythema.  Psychiatric: She has a normal mood and affect. Her behavior is normal. Judgment and thought content normal.  L dist aner thigh ?lipoma 3x3 cm flat - smaller   Lab Results  Component Value Date   WBC 4.9 10/02/2014   HGB 13.1 10/02/2014   HCT 39.3 10/02/2014   PLT 236.0 10/02/2014   GLUCOSE 99 10/02/2014   CHOL 210* 10/02/2014   TRIG 61.0 10/02/2014   HDL 104.90 10/02/2014   LDLCALC 93 10/02/2014   ALT 20 10/02/2014   AST 34 10/02/2014   NA 136 10/02/2014   K 3.9 10/02/2014   CL 98 10/02/2014   CREATININE 0.96 10/02/2014   BUN 21 10/02/2014   CO2 33* 10/02/2014   TSH 7.67* 11/03/2014          Assessment & Plan:

## 2015-01-02 NOTE — Patient Instructions (Signed)
Preventive Care for Adults A healthy lifestyle and preventive care can promote health and wellness. Preventive health guidelines for women include the following key practices.  A routine yearly physical is a good way to check with your health care provider about your health and preventive screening. It is a chance to share any concerns and updates on your health and to receive a thorough exam.  Visit your dentist for a routine exam and preventive care every 6 months. Brush your teeth twice a day and floss once a day. Good oral hygiene prevents tooth decay and gum disease.  The frequency of eye exams is based on your age, health, family medical history, use of contact lenses, and other factors. Follow your health care provider's recommendations for frequency of eye exams.  Eat a healthy diet. Foods like vegetables, fruits, whole grains, low-fat dairy products, and lean protein foods contain the nutrients you need without too many calories. Decrease your intake of foods high in solid fats, added sugars, and salt. Eat the right amount of calories for you.Get information about a proper diet from your health care provider, if necessary.  Regular physical exercise is one of the most important things you can do for your health. Most adults should get at least 150 minutes of moderate-intensity exercise (any activity that increases your heart rate and causes you to sweat) each week. In addition, most adults need muscle-strengthening exercises on 2 or more days a week.  Maintain a healthy weight. The body mass index (BMI) is a screening tool to identify possible weight problems. It provides an estimate of body fat based on height and weight. Your health care provider can find your BMI and can help you achieve or maintain a healthy weight.For adults 20 years and older:  A BMI below 18.5 is considered underweight.  A BMI of 18.5 to 24.9 is normal.  A BMI of 25 to 29.9 is considered overweight.  A BMI of  30 and above is considered obese.  Maintain normal blood lipids and cholesterol levels by exercising and minimizing your intake of saturated fat. Eat a balanced diet with plenty of fruit and vegetables. Blood tests for lipids and cholesterol should begin at age 76 and be repeated every 5 years. If your lipid or cholesterol levels are high, you are over 50, or you are at high risk for heart disease, you may need your cholesterol levels checked more frequently.Ongoing high lipid and cholesterol levels should be treated with medicines if diet and exercise are not working.  If you smoke, find out from your health care provider how to quit. If you do not use tobacco, do not start.  Lung cancer screening is recommended for adults aged 22-80 years who are at high risk for developing lung cancer because of a history of smoking. A yearly low-dose CT scan of the lungs is recommended for people who have at least a 30-pack-year history of smoking and are a current smoker or have quit within the past 15 years. A pack year of smoking is smoking an average of 1 pack of cigarettes a day for 1 year (for example: 1 pack a day for 30 years or 2 packs a day for 15 years). Yearly screening should continue until the smoker has stopped smoking for at least 15 years. Yearly screening should be stopped for people who develop a health problem that would prevent them from having lung cancer treatment.  If you are pregnant, do not drink alcohol. If you are breastfeeding,  be very cautious about drinking alcohol. If you are not pregnant and choose to drink alcohol, do not have more than 1 drink per day. One drink is considered to be 12 ounces (355 mL) of beer, 5 ounces (148 mL) of wine, or 1.5 ounces (44 mL) of liquor.  Avoid use of street drugs. Do not share needles with anyone. Ask for help if you need support or instructions about stopping the use of drugs.  High blood pressure causes heart disease and increases the risk of  stroke. Your blood pressure should be checked at least every 1 to 2 years. Ongoing high blood pressure should be treated with medicines if weight loss and exercise do not work.  If you are 75-52 years old, ask your health care provider if you should take aspirin to prevent strokes.  Diabetes screening involves taking a blood sample to check your fasting blood sugar level. This should be done once every 3 years, after age 15, if you are within normal weight and without risk factors for diabetes. Testing should be considered at a younger age or be carried out more frequently if you are overweight and have at least 1 risk factor for diabetes.  Breast cancer screening is essential preventive care for women. You should practice "breast self-awareness." This means understanding the normal appearance and feel of your breasts and may include breast self-examination. Any changes detected, no matter how small, should be reported to a health care provider. Women in their 58s and 30s should have a clinical breast exam (CBE) by a health care provider as part of a regular health exam every 1 to 3 years. After age 16, women should have a CBE every year. Starting at age 53, women should consider having a mammogram (breast X-ray test) every year. Women who have a family history of breast cancer should talk to their health care provider about genetic screening. Women at a high risk of breast cancer should talk to their health care providers about having an MRI and a mammogram every year.  Breast cancer gene (BRCA)-related cancer risk assessment is recommended for women who have family members with BRCA-related cancers. BRCA-related cancers include breast, ovarian, tubal, and peritoneal cancers. Having family members with these cancers may be associated with an increased risk for harmful changes (mutations) in the breast cancer genes BRCA1 and BRCA2. Results of the assessment will determine the need for genetic counseling and  BRCA1 and BRCA2 testing.  Routine pelvic exams to screen for cancer are no longer recommended for nonpregnant women who are considered low risk for cancer of the pelvic organs (ovaries, uterus, and vagina) and who do not have symptoms. Ask your health care provider if a screening pelvic exam is right for you.  If you have had past treatment for cervical cancer or a condition that could lead to cancer, you need Pap tests and screening for cancer for at least 20 years after your treatment. If Pap tests have been discontinued, your risk factors (such as having a new sexual partner) need to be reassessed to determine if screening should be resumed. Some women have medical problems that increase the chance of getting cervical cancer. In these cases, your health care provider may recommend more frequent screening and Pap tests.  The HPV test is an additional test that may be used for cervical cancer screening. The HPV test looks for the virus that can cause the cell changes on the cervix. The cells collected during the Pap test can be  tested for HPV. The HPV test could be used to screen women aged 64 years and older, and should be used in women of any age who have unclear Pap test results. After the age of 13, women should have HPV testing at the same frequency as a Pap test.  Colorectal cancer can be detected and often prevented. Most routine colorectal cancer screening begins at the age of 31 years and continues through age 33 years. However, your health care provider may recommend screening at an earlier age if you have risk factors for colon cancer. On a yearly basis, your health care provider may provide home test kits to check for hidden blood in the stool. Use of a small camera at the end of a tube, to directly examine the colon (sigmoidoscopy or colonoscopy), can detect the earliest forms of colorectal cancer. Talk to your health care provider about this at age 58, when routine screening begins. Direct  exam of the colon should be repeated every 5-10 years through age 59 years, unless early forms of pre-cancerous polyps or small growths are found.  People who are at an increased risk for hepatitis B should be screened for this virus. You are considered at high risk for hepatitis B if:  You were born in a country where hepatitis B occurs often. Talk with your health care provider about which countries are considered high risk.  Your parents were born in a high-risk country and you have not received a shot to protect against hepatitis B (hepatitis B vaccine).  You have HIV or AIDS.  You use needles to inject street drugs.  You live with, or have sex with, someone who has hepatitis B.  You get hemodialysis treatment.  You take certain medicines for conditions like cancer, organ transplantation, and autoimmune conditions.  Hepatitis C blood testing is recommended for all people born from [birth year] through 1965 and any individual with known risks for hepatitis C.  Practice safe sex. Use condoms and avoid high-risk sexual practices to reduce the spread of sexually transmitted infections (STIs). STIs include gonorrhea, chlamydia, syphilis, trichomonas, herpes, HPV, and human immunodeficiency virus (HIV). Herpes, HIV, and HPV are viral illnesses that have no cure. They can result in disability, cancer, and death.  You should be screened for sexually transmitted illnesses (STIs) including gonorrhea and chlamydia if:  You are sexually active and are younger than 24 years.  You are older than 24 years and your health care provider tells you that you are at risk for this type of infection.  Your sexual activity has changed since you were last screened and you are at an increased risk for chlamydia or gonorrhea. Ask your health care provider if you are at risk.  If you are at risk of being infected with HIV, it is recommended that you take a prescription medicine daily to prevent HIV infection. This is  called preexposure prophylaxis (PrEP). You are considered at risk if:  You are a heterosexual woman, are sexually active, and are at increased risk for HIV infection.  You take drugs by injection.  You are sexually active with a partner who has HIV.  Talk with your health care provider about whether you are at high risk of being infected with HIV. If you choose to begin PrEP, you should first be tested for HIV. You should then be tested every 3 months for as long as you are taking PrEP.  Osteoporosis is a disease in which the bones lose minerals and strength  with aging. This can result in serious bone fractures or breaks. The risk of osteoporosis can be identified using a bone density scan. Women ages 65 years and over and women at risk for fractures or osteoporosis should discuss screening with their health care providers. Ask your health care provider whether you should take a calcium supplement or vitamin D to reduce the rate of osteoporosis.  Menopause can be associated with physical symptoms and risks. Hormone replacement therapy is available to decrease symptoms and risks. You should talk to your health care provider about whether hormone replacement therapy is right for you.  Use sunscreen. Apply sunscreen liberally and repeatedly throughout the day. You should seek shade when your shadow is shorter than you. Protect yourself by wearing long sleeves, pants, a wide-brimmed hat, and sunglasses year round, whenever you are outdoors.  Once a month, do a whole body skin exam, using a mirror to look at the skin on your back. Tell your health care provider of new moles, moles that have irregular borders, moles that are larger than a pencil eraser, or moles that have changed in shape or color.  Stay current with required vaccines (immunizations).  Influenza vaccine. All adults should be immunized every year.  Tetanus, diphtheria, and acellular pertussis (Td, Tdap) vaccine. Pregnant women should  receive 1 dose of Tdap vaccine during each pregnancy. The dose should be obtained regardless of the length of time since the last dose. Immunization is preferred during the 27th-36th week of gestation. An adult who has not previously received Tdap or who does not know her vaccine status should receive 1 dose of Tdap. This initial dose should be followed by tetanus and diphtheria toxoids (Td) booster doses every 10 years. Adults with an unknown or incomplete history of completing a 3-dose immunization series with Td-containing vaccines should begin or complete a primary immunization series including a Tdap dose. Adults should receive a Td booster every 10 years.  Varicella vaccine. An adult without evidence of immunity to varicella should receive 2 doses or a second dose if she has previously received 1 dose. Pregnant females who do not have evidence of immunity should receive the first dose after pregnancy. This first dose should be obtained before leaving the health care facility. The second dose should be obtained 4-8 weeks after the first dose.  Human papillomavirus (HPV) vaccine. Females aged 13-26 years who have not received the vaccine previously should obtain the 3-dose series. The vaccine is not recommended for use in pregnant females. However, pregnancy testing is not needed before receiving a dose. If a female is found to be pregnant after receiving a dose, no treatment is needed. In that case, the remaining doses should be delayed until after the pregnancy. Immunization is recommended for any person with an immunocompromised condition through the age of 26 years if she did not get any or all doses earlier. During the 3-dose series, the second dose should be obtained 4-8 weeks after the first dose. The third dose should be obtained 24 weeks after the first dose and 16 weeks after the second dose.  Zoster vaccine. One dose is recommended for adults aged 60 years or older unless certain conditions are  present.  Measles, mumps, and rubella (MMR) vaccine. Adults born before 1957 generally are considered immune to measles and mumps. Adults born in 1957 or later should have 1 or more doses of MMR vaccine unless there is a contraindication to the vaccine or there is laboratory evidence of immunity to   each of the three diseases. A routine second dose of MMR vaccine should be obtained at least 28 days after the first dose for students attending postsecondary schools, health care workers, or international travelers. People who received inactivated measles vaccine or an unknown type of measles vaccine during 1963-1967 should receive 2 doses of MMR vaccine. People who received inactivated mumps vaccine or an unknown type of mumps vaccine before 1979 and are at high risk for mumps infection should consider immunization with 2 doses of MMR vaccine. For females of childbearing age, rubella immunity should be determined. If there is no evidence of immunity, females who are not pregnant should be vaccinated. If there is no evidence of immunity, females who are pregnant should delay immunization until after pregnancy. Unvaccinated health care workers born before 1957 who lack laboratory evidence of measles, mumps, or rubella immunity or laboratory confirmation of disease should consider measles and mumps immunization with 2 doses of MMR vaccine or rubella immunization with 1 dose of MMR vaccine.  Pneumococcal 13-valent conjugate (PCV13) vaccine. When indicated, a person who is uncertain of her immunization history and has no record of immunization should receive the PCV13 vaccine. An adult aged 19 years or older who has certain medical conditions and has not been previously immunized should receive 1 dose of PCV13 vaccine. This PCV13 should be followed with a dose of pneumococcal polysaccharide (PPSV23) vaccine. The PPSV23 vaccine dose should be obtained at least 8 weeks after the dose of PCV13 vaccine. An adult aged 19  years or older who has certain medical conditions and previously received 1 or more doses of PPSV23 vaccine should receive 1 dose of PCV13. The PCV13 vaccine dose should be obtained 1 or more years after the last PPSV23 vaccine dose.  Pneumococcal polysaccharide (PPSV23) vaccine. When PCV13 is also indicated, PCV13 should be obtained first. All adults aged 65 years and older should be immunized. An adult younger than age 65 years who has certain medical conditions should be immunized. Any person who resides in a nursing home or long-term care facility should be immunized. An adult smoker should be immunized. People with an immunocompromised condition and certain other conditions should receive both PCV13 and PPSV23 vaccines. People with human immunodeficiency virus (HIV) infection should be immunized as soon as possible after diagnosis. Immunization during chemotherapy or radiation therapy should be avoided. Routine use of PPSV23 vaccine is not recommended for American Indians, [place] Natives, or people younger than 65 years unless there are medical conditions that require PPSV23 vaccine. When indicated, people who have unknown immunization and have no record of immunization should receive PPSV23 vaccine. One-time revaccination 5 years after the first dose of PPSV23 is recommended for people aged 19-64 years who have chronic kidney failure, nephrotic syndrome, asplenia, or immunocompromised conditions. People who received 1-2 doses of PPSV23 before age 65 years should receive another dose of PPSV23 vaccine at age 65 years or later if at least 5 years have passed since the previous dose. Doses of PPSV23 are not needed for people immunized with PPSV23 at or after age 65 years.  Meningococcal vaccine. Adults with asplenia or persistent complement component deficiencies should receive 2 doses of quadrivalent meningococcal conjugate (MenACWY-D) vaccine. The doses should be obtained at least 2 months apart.  Microbiologists working with certain meningococcal bacteria, military recruits, people at risk during an outbreak, and people who travel to or live in countries with a high rate of meningitis should be immunized. A first-year college student up through age   21 years who is living in a residence hall should receive a dose if she did not receive a dose on or after her 16th birthday. Adults who have certain high-risk conditions should receive one or more doses of vaccine.  Hepatitis A vaccine. Adults who wish to be protected from this disease, have certain high-risk conditions, work with hepatitis A-infected animals, work in hepatitis A research labs, or travel to or work in countries with a high rate of hepatitis A should be immunized. Adults who were previously unvaccinated and who anticipate close contact with an international adoptee during the first 60 days after arrival in the [place] from a country with a high rate of hepatitis A should be immunized.  Hepatitis B vaccine. Adults who wish to be protected from this disease, have certain high-risk conditions, may be exposed to blood or other infectious body fluids, are household contacts or sex partners of hepatitis B positive people, are clients or workers in certain care facilities, or travel to or work in countries with a high rate of hepatitis B should be immunized.  Haemophilus influenzae type b (Hib) vaccine. A previously unvaccinated person with asplenia or sickle cell disease or having a scheduled splenectomy should receive 1 dose of Hib vaccine. Regardless of previous immunization, a recipient of a hematopoietic stem cell transplant should receive a 3-dose series 6-12 months after her successful transplant. Hib vaccine is not recommended for adults with HIV infection. Preventive Services / Frequency Ages 64 to 68 years  Blood pressure check.** / Every 1 to 2 years.  Lipid and cholesterol check.** / Every 5 years beginning at age  22.  Clinical breast exam.** / Every 3 years for women in their 88s and 53s.  BRCA-related cancer risk assessment.** / For women who have family members with a BRCA-related cancer (breast, ovarian, tubal, or peritoneal cancers).  Pap test.** / Every 2 years from ages 90 through 51. Every 3 years starting at age 21 through age 56 or 3 with a history of 3 consecutive normal Pap tests.  HPV screening.** / Every 3 years from ages 24 through ages 1 to 46 with a history of 3 consecutive normal Pap tests.  Hepatitis C blood test.** / For any individual with known risks for hepatitis C.  Skin self-exam. / Monthly.  Influenza vaccine. / Every year.  Tetanus, diphtheria, and acellular pertussis (Tdap, Td) vaccine.** / Consult your health care provider. Pregnant women should receive 1 dose of Tdap vaccine during each pregnancy. 1 dose of Td every 10 years.  Varicella vaccine.** / Consult your health care provider. Pregnant females who do not have evidence of immunity should receive the first dose after pregnancy.  HPV vaccine. / 3 doses over 6 months, if 72 and younger. The vaccine is not recommended for use in pregnant females. However, pregnancy testing is not needed before receiving a dose.  Measles, mumps, rubella (MMR) vaccine.** / You need at least 1 dose of MMR if you were born in 1957 or later. You may also need a 2nd dose. For females of childbearing age, rubella immunity should be determined. If there is no evidence of immunity, females who are not pregnant should be vaccinated. If there is no evidence of immunity, females who are pregnant should delay immunization until after pregnancy.  Pneumococcal 13-valent conjugate (PCV13) vaccine.** / Consult your health care provider.  Pneumococcal polysaccharide (PPSV23) vaccine.** / 1 to 2 doses if you smoke cigarettes or if you have certain conditions.  Meningococcal vaccine.** /  1 dose if you are age 19 to 7 years and a [place] living in a residence hall, or have one of several medical conditions, you need to get vaccinated against meningococcal disease. You may also need additional booster doses.  Hepatitis A vaccine.** / Consult your health care provider.  Hepatitis B vaccine.** / Consult your health care provider.  Haemophilus influenzae type b (Hib) vaccine.** / Consult your health care provider. Ages 67 to 13 years  Blood pressure check.** / Every 1 to 2 years.  Lipid and cholesterol check.** / Every 5 years beginning at age 38 years.  Lung cancer screening. / Every year if you are aged 32-80 years and have a 30-pack-year history of smoking and currently smoke or have quit within the past 15 years. Yearly screening is stopped once you have quit smoking for at least 15 years or develop a health problem that would prevent you from having lung cancer treatment.  Clinical breast exam.** / Every year after age 66 years.  BRCA-related cancer risk assessment.** / For women who have family members with a BRCA-related cancer (breast, ovarian, tubal, or peritoneal cancers).  Mammogram.** / Every year beginning at age 79 years and continuing for as long as you are in good health. Consult with your health care provider.  Pap test.** / Every 3 years starting at age 30 years through age 53 or 57 years with a history of 3 consecutive normal Pap tests.  HPV screening.** / Every 3 years from ages 71 years through ages 62 to 82 years with a history of 3 consecutive normal Pap tests.  Fecal occult blood test (FOBT) of stool. / Every year beginning at age 47 years and continuing until age 25 years. You may not need to do this test if you get a colonoscopy every 10 years.  Flexible sigmoidoscopy or colonoscopy.** / Every 5 years for a flexible sigmoidoscopy or every 10 years for a colonoscopy beginning at age 50 years and continuing until age 46 years.  Hepatitis C blood test.** / For all people born from [birth year] through  1965 and any individual with known risks for hepatitis C.  Skin self-exam. / Monthly.  Influenza vaccine. / Every year.  Tetanus, diphtheria, and acellular pertussis (Tdap/Td) vaccine.** / Consult your health care provider. Pregnant women should receive 1 dose of Tdap vaccine during each pregnancy. 1 dose of Td every 10 years.  Varicella vaccine.** / Consult your health care provider. Pregnant females who do not have evidence of immunity should receive the first dose after pregnancy.  Zoster vaccine.** / 1 dose for adults aged 20 years or older.  Measles, mumps, rubella (MMR) vaccine.** / You need at least 1 dose of MMR if you were born in 1957 or later. You may also need a 2nd dose. For females of childbearing age, rubella immunity should be determined. If there is no evidence of immunity, females who are not pregnant should be vaccinated. If there is no evidence of immunity, females who are pregnant should delay immunization until after pregnancy.  Pneumococcal 13-valent conjugate (PCV13) vaccine.** / Consult your health care provider.  Pneumococcal polysaccharide (PPSV23) vaccine.** / 1 to 2 doses if you smoke cigarettes or if you have certain conditions.  Meningococcal vaccine.** / Consult your health care provider.  Hepatitis A vaccine.** / Consult your health care provider.  Hepatitis B vaccine.** / Consult your health care provider.  Haemophilus influenzae type b (Hib) vaccine.** / Consult your health care provider. Ages 50  years and over  Blood pressure check.** / Every 1 to 2 years.  Lipid and cholesterol check.** / Every 5 years beginning at age 61 years.  Lung cancer screening. / Every year if you are aged 48-80 years and have a 30-pack-year history of smoking and currently smoke or have quit within the past 15 years. Yearly screening is stopped once you have quit smoking for at least 15 years or develop a health problem that would prevent you from having lung cancer  treatment.  Clinical breast exam.** / Every year after age 41 years.  BRCA-related cancer risk assessment.** / For women who have family members with a BRCA-related cancer (breast, ovarian, tubal, or peritoneal cancers).  Mammogram.** / Every year beginning at age 45 years and continuing for as long as you are in good health. Consult with your health care provider.  Pap test.** / Every 3 years starting at age 92 years through age 109 or 84 years with 3 consecutive normal Pap tests. Testing can be stopped between 65 and 70 years with 3 consecutive normal Pap tests and no abnormal Pap or HPV tests in the past 10 years.  HPV screening.** / Every 3 years from ages 34 years through ages 39 or 22 years with a history of 3 consecutive normal Pap tests. Testing can be stopped between 65 and 70 years with 3 consecutive normal Pap tests and no abnormal Pap or HPV tests in the past 10 years.  Fecal occult blood test (FOBT) of stool. / Every year beginning at age 61 years and continuing until age 50 years. You may not need to do this test if you get a colonoscopy every 10 years.  Flexible sigmoidoscopy or colonoscopy.** / Every 5 years for a flexible sigmoidoscopy or every 10 years for a colonoscopy beginning at age 43 years and continuing until age 39 years.  Hepatitis C blood test.** / For all people born from [birth year] through 1966 and any individual with known risks for hepatitis C.  Osteoporosis screening.** / A one-time screening for women ages 106 years and over and women at risk for fractures or osteoporosis.  Skin self-exam. / Monthly.  Influenza vaccine. / Every year.  Tetanus, diphtheria, and acellular pertussis (Tdap/Td) vaccine.** / 1 dose of Td every 10 years.  Varicella vaccine.** / Consult your health care provider.  Zoster vaccine.** / 1 dose for adults aged 31 years or older.  Pneumococcal 13-valent conjugate (PCV13) vaccine.** / Consult your health care provider.  Pneumococcal  polysaccharide (PPSV23) vaccine.** / 1 dose for all adults aged 57 years and older.  Meningococcal vaccine.** / Consult your health care provider.  Hepatitis A vaccine.** / Consult your health care provider.  Hepatitis B vaccine.** / Consult your health care provider.  Haemophilus influenzae type b (Hib) vaccine.** / Consult your health care provider. ** Family history and personal history of risk and conditions may change your health care provider's recommendations. Document Released: 10/28/2001 Document Revised: 01/16/2014 Document Reviewed: 01/27/2011 [facility] Patient Information 2015 [place], [place]. This information is not intended to replace advice given to you by your health care provider. Make sure you discuss any questions you have with your health care provider.

## 2015-03-20 ENCOUNTER — Ambulatory Visit: Payer: Medicare Other | Admitting: Internal Medicine

## 2015-03-21 ENCOUNTER — Ambulatory Visit (INDEPENDENT_AMBULATORY_CARE_PROVIDER_SITE_OTHER): Payer: Medicare Other | Admitting: Internal Medicine

## 2015-03-21 ENCOUNTER — Encounter: Payer: Self-pay | Admitting: Internal Medicine

## 2015-03-21 VITALS — BP 108/68 | HR 64 | Temp 98.1°F | Resp 16 | Ht 65.0 in | Wt 120.0 lb

## 2015-03-21 DIAGNOSIS — M545 Low back pain, unspecified: Secondary | ICD-10-CM

## 2015-03-21 DIAGNOSIS — E038 Other specified hypothyroidism: Secondary | ICD-10-CM

## 2015-03-21 DIAGNOSIS — E034 Atrophy of thyroid (acquired): Secondary | ICD-10-CM

## 2015-03-21 DIAGNOSIS — D1724 Benign lipomatous neoplasm of skin and subcutaneous tissue of left leg: Secondary | ICD-10-CM | POA: Diagnosis not present

## 2015-03-21 MED ORDER — OXYCODONE HCL ER 40 MG PO T12A: 40.0000 mg | EXTENDED_RELEASE_TABLET | Freq: Four times a day (QID) | ORAL | Status: DC

## 2015-03-21 MED ORDER — OXYCODONE HCL ER 80 MG PO T12A: 80.0000 mg | EXTENDED_RELEASE_TABLET | Freq: Four times a day (QID) | ORAL | Status: DC

## 2015-03-21 NOTE — Progress Notes (Signed)
Pre visit review using our clinic review tool, if applicable. No additional management support is needed unless otherwise documented below in the visit note. 

## 2015-03-21 NOTE — Assessment & Plan Note (Signed)
Dr Loanne Drilling 1/16 - hypothyroid On Levothroid

## 2015-03-21 NOTE — Progress Notes (Signed)
Subjective:  Patient ID: Abigail Wilson, female    DOB: 04-27-1961  Age: 54 y.o. MRN: [id number]  CC: No chief complaint on file.   HPI ODALYS WIN presents for f/u chronic pain  Outpatient Prescriptions Prior to Visit  Medication Sig Dispense Refill  . albuterol (PROVENTIL HFA;VENTOLIN HFA) 108 (90 BASE) MCG/ACT inhaler Cletus Mehlhoff 8.5 each 1  . aspirin 81 MG EC tablet Take 81 mg by mouth daily.      . Cholecalciferol (VITAMIN D3) 1000 UNITS tablet Take 1,000 Units by mouth daily.      [name] COENZYME Q-10 PO Take by mouth.      . furosemide (LASIX) 40 MG tablet TAKE 1 TABLET BY MOUTH DAILY 30 tablet 5  . KLOR-CON M20 20 MEQ tablet TAKE 1 TABLET BY MOUTH EVERY DAY 30 tablet 4  . levothyroxine (LEVOTHROID) 25 MCG tablet Take 2 tablets (50 mcg total) by mouth daily before breakfast. 60 tablet 11  . Multiple Minerals-Vitamins (CALCIUM CITRATE +) TABS Take 1 tablet by mouth daily.      [name] triamcinolone (NASACORT) 55 MCG/ACT AERO nasal inhaler Place 2 sprays into the nose daily. 1 Inhaler 12  . OxyCODONE (OXYCONTIN) 40 mg T12A 12 hr tablet Take 1 tablet (40 mg total) by mouth every 6 (six) hours. Please fill on or after 03/24/15 120 tablet 0  . OxyCODONE (OXYCONTIN) 80 mg T12A 12 hr tablet Take 1 tablet (80 mg total) by mouth every 6 (six) hours. Please fill on or after 03/24/15 120 tablet 0   No facility-administered medications prior to visit.    ROS Review of Systems  Constitutional: Negative for chills, activity change, appetite change, fatigue and unexpected weight change.  HENT: Negative for congestion, mouth sores and sinus pressure.   Eyes: Negative for visual disturbance.  Respiratory: Negative for cough and chest tightness.   Gastrointestinal: Negative for nausea and abdominal pain.  Genitourinary: Negative for frequency, difficulty urinating and vaginal pain.  Musculoskeletal: Positive for back pain. Negative for gait problem.  Skin: Negative for pallor and rash.    Neurological: Negative for dizziness, tremors, weakness, numbness and headaches.  Psychiatric/Behavioral: Negative for suicidal ideas, confusion and sleep disturbance.    Objective:  BP 108/68 mmHg  Pulse 64  Temp(Src) 98.1 F (36.7 C) (Oral)  Resp 16  Ht 5\' 5"  (1.651 m)  Wt 120 lb 0.6 oz (54.45 kg)  BMI 19.98 kg/m2  SpO2 97%  BP Readings from Last 3 Encounters:  03/21/15 108/68  01/02/15 110/78  10/02/14 110/78    Wt Readings from Last 3 Encounters:  03/21/15 120 lb 0.6 oz (54.45 kg)  01/02/15 119 lb (53.978 kg)  10/02/14 127 lb (57.607 kg)    Physical Exam  Constitutional: She appears well-developed. No distress.  HENT:  Head: Normocephalic.  Right Ear: External ear normal.  Left Ear: External ear normal.  Nose: Nose normal.  Mouth/Throat: Oropharynx is clear and moist.  Eyes: Conjunctivae are normal. Pupils are equal, round, and reactive to light. Right eye exhibits no discharge. Left eye exhibits no discharge.  Neck: Normal range of motion. Neck supple. No JVD present. No tracheal deviation present. No thyromegaly present.  Cardiovascular: Normal rate, regular rhythm and normal heart sounds.   Pulmonary/Chest: No stridor. No respiratory distress. She has no wheezes.  Abdominal: Soft. Bowel sounds are normal. She exhibits no distension and no mass. There is no tenderness. There is no rebound and no guarding.  Musculoskeletal: She exhibits tenderness. She exhibits no  edema.  Lymphadenopathy:    She has no cervical adenopathy.  Neurological: She displays normal reflexes. No cranial nerve deficit. She exhibits normal muscle tone. Coordination normal.  Skin: No rash noted. No erythema.  Psychiatric: She has a normal mood and affect. Her behavior is normal. Judgment and thought content normal.  LS tender w/ROM  Lab Results  Component Value Date   WBC 3.9* 01/02/2015   HGB 13.0 01/02/2015   HCT 37.0 01/02/2015   PLT 221.0 01/02/2015   GLUCOSE 101* 01/02/2015    CHOL 203* 01/02/2015   TRIG 46.0 01/02/2015   HDL 114.80 01/02/2015   LDLCALC 79 01/02/2015   ALT 14 01/02/2015   AST 27 01/02/2015   NA 137 01/02/2015   K 4.2 01/02/2015   CL 99 01/02/2015   CREATININE 0.76 01/02/2015   BUN 14 01/02/2015   CO2 34* 01/02/2015   TSH 1.99 01/02/2015    Mm Screening Breast Tomo Bilateral  04/18/2014   CLINICAL DATA:  Screening.  EXAM: DIGITAL SCREENING BILATERAL MAMMOGRAM WITH 3D TOMO WITH CAD  COMPARISON:  Previous exam(s).  ACR Breast Density Category c: The breast tissue is heterogeneously dense, which may obscure small masses.  FINDINGS: There are no findings suspicious for malignancy. Images were processed with CAD.  IMPRESSION: No mammographic evidence of malignancy. A result letter of this screening mammogram will be mailed directly to the patient.  RECOMMENDATION: Screening mammogram in one year. (Code:SM-B-01Y)  BI-RADS CATEGORY  1: Negative.   Electronically Signed   By: Abelardo Diesel M.D.   On: 04/18/2014 14:16    Assessment & Plan:   Diagnoses and all orders for this visit:  Hypothyroidism due to acquired atrophy of thyroid Orders: -     Discontinue: OxyCODONE (OXYCONTIN) 80 mg T12A 12 hr tablet; Take 1 tablet (80 mg total) by mouth every 6 (six) hours. Please fill on or after 04/24/15 -     Discontinue: OxyCODONE (OXYCONTIN) 40 mg T12A 12 hr tablet; Take 1 tablet (40 mg total) by mouth every 6 (six) hours. Please fill on or after 04/24/15 -     Discontinue: OxyCODONE (OXYCONTIN) 80 mg T12A 12 hr tablet; Take 1 tablet (80 mg total) by mouth every 6 (six) hours. Please fill on or after 05/25/15 -     Discontinue: OxyCODONE (OXYCONTIN) 40 mg T12A 12 hr tablet; Take 1 tablet (40 mg total) by mouth every 6 (six) hours. Please fill on or after 05/25/15 -     OxyCODONE (OXYCONTIN) 80 mg T12A 12 hr tablet; Take 1 tablet (80 mg total) by mouth every 6 (six) hours. Please fill on or after 06/24/15 -     OxyCODONE (OXYCONTIN) 40 mg T12A 12 hr tablet; Take 1 tablet  (40 mg total) by mouth every 6 (six) hours. Please fill on or after 06/24/15  Bilateral low back pain without sciatica Orders: -     Discontinue: OxyCODONE (OXYCONTIN) 80 mg T12A 12 hr tablet; Take 1 tablet (80 mg total) by mouth every 6 (six) hours. Please fill on or after 04/24/15 -     Discontinue: OxyCODONE (OXYCONTIN) 40 mg T12A 12 hr tablet; Take 1 tablet (40 mg total) by mouth every 6 (six) hours. Please fill on or after 04/24/15 -     Discontinue: OxyCODONE (OXYCONTIN) 80 mg T12A 12 hr tablet; Take 1 tablet (80 mg total) by mouth every 6 (six) hours. Please fill on or after 05/25/15 -     Discontinue: OxyCODONE (OXYCONTIN) 40 mg T12A 12  hr tablet; Take 1 tablet (40 mg total) by mouth every 6 (six) hours. Please fill on or after 05/25/15 -     OxyCODONE (OXYCONTIN) 80 mg T12A 12 hr tablet; Take 1 tablet (80 mg total) by mouth every 6 (six) hours. Please fill on or after 06/24/15 -     OxyCODONE (OXYCONTIN) 40 mg T12A 12 hr tablet; Take 1 tablet (40 mg total) by mouth every 6 (six) hours. Please fill on or after 06/24/15  Benign lipomatous neoplasm of skin and subcutaneous tissue of left leg Orders: -     Discontinue: OxyCODONE (OXYCONTIN) 80 mg T12A 12 hr tablet; Take 1 tablet (80 mg total) by mouth every 6 (six) hours. Please fill on or after 04/24/15 -     Discontinue: OxyCODONE (OXYCONTIN) 40 mg T12A 12 hr tablet; Take 1 tablet (40 mg total) by mouth every 6 (six) hours. Please fill on or after 04/24/15 -     Discontinue: OxyCODONE (OXYCONTIN) 80 mg T12A 12 hr tablet; Take 1 tablet (80 mg total) by mouth every 6 (six) hours. Please fill on or after 05/25/15 -     Discontinue: OxyCODONE (OXYCONTIN) 40 mg T12A 12 hr tablet; Take 1 tablet (40 mg total) by mouth every 6 (six) hours. Please fill on or after 05/25/15 -     OxyCODONE (OXYCONTIN) 80 mg T12A 12 hr tablet; Take 1 tablet (80 mg total) by mouth every 6 (six) hours. Please fill on or after 06/24/15 -     OxyCODONE (OXYCONTIN) 40 mg T12A 12 hr tablet;  Take 1 tablet (40 mg total) by mouth every 6 (six) hours. Please fill on or after 06/24/15   I have discontinued Ms. Woodfin's OxyCODONE, OxyCODONE, OxyCODONE, and OxyCODONE. I have also changed her OxyCODONE and OxyCODONE. Additionally, I am having her maintain her aspirin, CALCIUM CITRATE +, COENZYME Q-10 PO, cholecalciferol, albuterol, KLOR-CON M20, triamcinolone, furosemide, and levothyroxine.  Meds ordered this encounter  Medications  . DISCONTD: OxyCODONE (OXYCONTIN) 80 mg T12A 12 hr tablet    Sig: Take 1 tablet (80 mg total) by mouth every 6 (six) hours. Please fill on or after 04/24/15    Dispense:  120 tablet    Refill:  0  . DISCONTD: OxyCODONE (OXYCONTIN) 40 mg T12A 12 hr tablet    Sig: Take 1 tablet (40 mg total) by mouth every 6 (six) hours. Please fill on or after 04/24/15    Dispense:  120 tablet    Refill:  0  . DISCONTD: OxyCODONE (OXYCONTIN) 80 mg T12A 12 hr tablet    Sig: Take 1 tablet (80 mg total) by mouth every 6 (six) hours. Please fill on or after 05/25/15    Dispense:  120 tablet    Refill:  0  . DISCONTD: OxyCODONE (OXYCONTIN) 40 mg T12A 12 hr tablet    Sig: Take 1 tablet (40 mg total) by mouth every 6 (six) hours. Please fill on or after 05/25/15    Dispense:  120 tablet    Refill:  0  . OxyCODONE (OXYCONTIN) 80 mg T12A 12 hr tablet    Sig: Take 1 tablet (80 mg total) by mouth every 6 (six) hours. Please fill on or after 06/24/15    Dispense:  120 tablet    Refill:  0  . OxyCODONE (OXYCONTIN) 40 mg T12A 12 hr tablet    Sig: Take 1 tablet (40 mg total) by mouth every 6 (six) hours. Please fill on or after 06/24/15    Dispense:  120 tablet    Refill:  0     Follow-up: Return in about 3 months (around 06/21/2015) for a follow-up visit.  Walker Kehr, MD

## 2015-03-21 NOTE — Assessment & Plan Note (Signed)
Chronic Oxycodone/Oxycontin Rx - long term  Potential benefits of a long term opioids use as well as potential risks (i.e. addiction risk, apnea etc) and complications (i.e. Somnolence, constipation and others) were explained to the patient and were aknowledged. 

## 2015-04-05 ENCOUNTER — Other Ambulatory Visit: Payer: Self-pay | Admitting: Geriatric Medicine

## 2015-04-05 MED ORDER — POTASSIUM CHLORIDE CRYS ER 20 MEQ PO TBCR: 20.0000 meq | EXTENDED_RELEASE_TABLET | Freq: Every day | ORAL | Status: DC

## 2015-04-10 ENCOUNTER — Encounter: Payer: Self-pay | Admitting: Gastroenterology

## 2015-04-10 DIAGNOSIS — H2513 Age-related nuclear cataract, bilateral: Secondary | ICD-10-CM | POA: Diagnosis not present

## 2015-04-10 DIAGNOSIS — H04123 Dry eye syndrome of bilateral lacrimal glands: Secondary | ICD-10-CM | POA: Diagnosis not present

## 2015-05-17 ENCOUNTER — Other Ambulatory Visit: Payer: Self-pay | Admitting: *Deleted

## 2015-05-17 MED ORDER — POTASSIUM CHLORIDE CRYS ER 20 MEQ PO TBCR: 20.0000 meq | EXTENDED_RELEASE_TABLET | Freq: Every day | ORAL | Status: DC

## 2015-05-17 MED ORDER — LEVOTHYROXINE SODIUM 25 MCG PO TABS: 50.0000 ug | ORAL_TABLET | Freq: Every day | ORAL | Status: DC

## 2015-05-17 MED ORDER — FUROSEMIDE 40 MG PO TABS: 40.0000 mg | ORAL_TABLET | Freq: Every day | ORAL | Status: DC

## 2015-07-16 ENCOUNTER — Encounter: Payer: Self-pay | Admitting: Internal Medicine

## 2015-07-16 ENCOUNTER — Other Ambulatory Visit (INDEPENDENT_AMBULATORY_CARE_PROVIDER_SITE_OTHER): Payer: Medicare Other

## 2015-07-16 ENCOUNTER — Ambulatory Visit (INDEPENDENT_AMBULATORY_CARE_PROVIDER_SITE_OTHER): Payer: Medicare Other | Admitting: Internal Medicine

## 2015-07-16 VITALS — BP 90/58 | HR 63 | Wt 124.0 lb

## 2015-07-16 DIAGNOSIS — M545 Low back pain, unspecified: Secondary | ICD-10-CM

## 2015-07-16 DIAGNOSIS — Z780 Asymptomatic menopausal state: Secondary | ICD-10-CM

## 2015-07-16 DIAGNOSIS — E038 Other specified hypothyroidism: Secondary | ICD-10-CM | POA: Diagnosis not present

## 2015-07-16 DIAGNOSIS — R634 Abnormal weight loss: Secondary | ICD-10-CM

## 2015-07-16 DIAGNOSIS — G8929 Other chronic pain: Secondary | ICD-10-CM

## 2015-07-16 DIAGNOSIS — E034 Atrophy of thyroid (acquired): Secondary | ICD-10-CM

## 2015-07-16 DIAGNOSIS — D1724 Benign lipomatous neoplasm of skin and subcutaneous tissue of left leg: Secondary | ICD-10-CM

## 2015-07-16 DIAGNOSIS — Z23 Encounter for immunization: Secondary | ICD-10-CM

## 2015-07-16 LAB — BASIC METABOLIC PANEL
BUN: 15 mg/dL (ref 6–23)
CALCIUM: 9.1 mg/dL (ref 8.4–10.5)
CO2: 29 meq/L (ref 19–32)
CREATININE: 0.81 mg/dL (ref 0.40–1.20)
Chloride: 102 mEq/L (ref 96–112)
GFR: 77.84 mL/min (ref >60.00)
GLUCOSE: 93 mg/dL (ref 70–99)
Potassium: 3.6 mEq/L (ref 3.5–5.1)
Sodium: 138 mEq/L (ref 135–145)

## 2015-07-16 LAB — TSH: TSH: 4.28 u[IU]/mL (ref 0.35–4.50)

## 2015-07-16 MED ORDER — OXYCODONE HCL ER 80 MG PO T12A: 80.0000 mg | EXTENDED_RELEASE_TABLET | Freq: Four times a day (QID) | ORAL | Status: DC

## 2015-07-16 MED ORDER — OXYCODONE HCL ER 40 MG PO T12A
40.0000 mg | EXTENDED_RELEASE_TABLET | Freq: Four times a day (QID) | ORAL | Status: DC
Start: 2015-07-16 — End: 2015-07-16

## 2015-07-16 MED ORDER — OXYCODONE HCL ER 40 MG PO T12A: 40.0000 mg | EXTENDED_RELEASE_TABLET | Freq: Four times a day (QID) | ORAL | Status: DC

## 2015-07-16 NOTE — Assessment & Plan Note (Signed)
Chronic Oxycodone/Oxycontin Rx - long term  Potential benefits of a long term opioids use as well as potential risks (i.e. addiction risk, apnea etc) and complications (i.e. Somnolence, constipation and others) were explained to the patient and were aknowledged. 

## 2015-07-16 NOTE — Assessment & Plan Note (Signed)
Wt Readings from Last 3 Encounters:  07/16/15 124 lb (56.246 kg)  03/21/15 120 lb 0.6 oz (54.45 kg)  01/02/15 119 lb (53.978 kg)

## 2015-07-16 NOTE — Progress Notes (Signed)
Pre visit review using our clinic review tool, if applicable. No additional management support is needed unless otherwise documented below in the visit note. 

## 2015-07-16 NOTE — Progress Notes (Signed)
Subjective:  Patient ID: Abigail Wilson, female    DOB: [birth year]  Age: 54 y.o. MRN: [id number]  CC: No chief complaint on file.   HPI Abigail Wilson presents for LBP, hypothyroidism.  Outpatient Prescriptions Prior to Visit  Medication Sig Dispense Refill  . albuterol (PROVENTIL HFA;VENTOLIN HFA) 108 (90 BASE) MCG/ACT inhaler Plotnikov, Aleksei 8.5 each 1  . aspirin 81 MG EC tablet Take 81 mg by mouth daily.      . Cholecalciferol (VITAMIN D3) 1000 UNITS tablet Take 1,000 Units by mouth daily.      [name] COENZYME Q-10 PO Take by mouth.      . furosemide (LASIX) 40 MG tablet Take 1 tablet (40 mg total) by mouth daily. 90 tablet 3  . levothyroxine (LEVOTHROID) 25 MCG tablet Take 2 tablets (50 mcg total) by mouth daily before breakfast. 180 tablet 3  . Multiple Minerals-Vitamins (CALCIUM CITRATE +) TABS Take 1 tablet by mouth daily.      . potassium chloride SA (KLOR-CON M20) 20 MEQ tablet Take 1 tablet (20 mEq total) by mouth daily. 90 tablet 3  . triamcinolone (NASACORT) 55 MCG/ACT AERO nasal inhaler Place 2 sprays into the nose daily. 1 Inhaler 12  . OxyCODONE (OXYCONTIN) 40 mg T12A 12 hr tablet Take 1 tablet (40 mg total) by mouth every 6 (six) hours. Please fill on or after 06/24/15 120 tablet 0  . OxyCODONE (OXYCONTIN) 80 mg T12A 12 hr tablet Take 1 tablet (80 mg total) by mouth every 6 (six) hours. Please fill on or after 06/24/15 120 tablet 0   No facility-administered medications prior to visit.    ROS Review of Systems  Constitutional: Negative for chills, activity change, appetite change, fatigue and unexpected weight change.  HENT: Negative for congestion, mouth sores and sinus pressure.   Eyes: Negative for visual disturbance.  Respiratory: Negative for cough and chest tightness.   Gastrointestinal: Negative for nausea and abdominal pain.  Genitourinary: Negative for frequency, difficulty urinating and vaginal pain.  Musculoskeletal: Positive for back pain. Negative  for arthralgias and gait problem.  Skin: Negative for pallor and rash.  Neurological: Negative for dizziness, tremors, weakness, numbness and headaches.  Psychiatric/Behavioral: Negative for confusion and sleep disturbance. The patient is nervous/anxious.     Objective:  BP 90/58 mmHg  Pulse 63  Wt 124 lb (56.246 kg)  SpO2 97%  BP Readings from Last 3 Encounters:  07/16/15 90/58  03/21/15 108/68  01/02/15 110/78    Wt Readings from Last 3 Encounters:  07/16/15 124 lb (56.246 kg)  03/21/15 120 lb 0.6 oz (54.45 kg)  01/02/15 119 lb (53.978 kg)    Physical Exam  Constitutional: She appears well-developed. No distress.  HENT:  Head: Normocephalic.  Right Ear: External ear normal.  Left Ear: External ear normal.  Nose: Nose normal.  Mouth/Throat: Oropharynx is clear and moist.  Eyes: Conjunctivae are normal. Pupils are equal, round, and reactive to light. Right eye exhibits no discharge. Left eye exhibits no discharge.  Neck: Normal range of motion. Neck supple. No JVD present. No tracheal deviation present. No thyromegaly present.  Cardiovascular: Normal rate, regular rhythm and normal heart sounds.   Pulmonary/Chest: No stridor. No respiratory distress. She has no wheezes.  Abdominal: Soft. Bowel sounds are normal. She exhibits no distension and no mass. There is no tenderness. There is no rebound and no guarding.  Musculoskeletal: She exhibits tenderness. She exhibits no edema.  Lymphadenopathy:    She has no cervical adenopathy.  Neurological: She displays normal reflexes. No cranial nerve deficit. She exhibits normal muscle tone. Coordination normal.  Skin: No rash noted. No erythema.  Psychiatric: She has a normal mood and affect. Her behavior is normal. Judgment and thought content normal.  LS is tender  Lab Results  Component Value Date   WBC 3.9* 01/02/2015   HGB 13.0 01/02/2015   HCT 37.0 01/02/2015   PLT 221.0 01/02/2015   GLUCOSE 101* 01/02/2015   CHOL 203*  01/02/2015   TRIG 46.0 01/02/2015   HDL 114.80 01/02/2015   LDLCALC 79 01/02/2015   ALT 14 01/02/2015   AST 27 01/02/2015   NA 137 01/02/2015   K 4.2 01/02/2015   CL 99 01/02/2015   CREATININE 0.76 01/02/2015   BUN 14 01/02/2015   CO2 34* 01/02/2015   TSH 1.99 01/02/2015    Mm Screening Breast Tomo Bilateral  04/18/2014  CLINICAL DATA:  Screening. EXAM: DIGITAL SCREENING BILATERAL MAMMOGRAM WITH 3D TOMO WITH CAD COMPARISON:  Previous exam(s). ACR Breast Density Category c: The breast tissue is heterogeneously dense, which may obscure small masses. FINDINGS: There are no findings suspicious for malignancy. Images were processed with CAD. IMPRESSION: No mammographic evidence of malignancy. A result letter of this screening mammogram will be mailed directly to the patient. RECOMMENDATION: Screening mammogram in one year. (Code:SM-B-01Y) BI-RADS CATEGORY  1: Negative. Electronically Signed   By: Abelardo Diesel M.D.   On: 04/18/2014 14:16    Assessment & Plan:   Diagnoses and all orders for this visit:  Bilateral low back pain without sciatica -     Discontinue: oxyCODONE (OXYCONTIN) 40 mg 12 hr tablet; Take 1 tablet (40 mg total) by mouth every 6 (six) hours. Please fill on or after 07/25/15 -     Discontinue: oxyCODONE (OXYCONTIN) 80 mg 12 hr tablet; Take 1 tablet (80 mg total) by mouth every 6 (six) hours. Please fill on or after 07/25/15 -     Discontinue: oxyCODONE (OXYCONTIN) 80 mg 12 hr tablet; Take 1 tablet (80 mg total) by mouth every 6 (six) hours. Please fill on or after 08/24/15 -     Discontinue: oxyCODONE (OXYCONTIN) 40 mg 12 hr tablet; Take 1 tablet (40 mg total) by mouth every 6 (six) hours. Please fill on or after 08/24/15 -     oxyCODONE (OXYCONTIN) 40 mg 12 hr tablet; Take 1 tablet (40 mg total) by mouth every 6 (six) hours. Please fill on or after 09/24/15 -     oxyCODONE (OXYCONTIN) 80 mg 12 hr tablet; Take 1 tablet (80 mg total) by mouth every 6 (six) hours. Please fill on or  after 09/24/15 -     Basic metabolic panel; Future -     T4, free; Future -     TSH; Future  Hypothyroidism due to acquired atrophy of thyroid -     Discontinue: oxyCODONE (OXYCONTIN) 40 mg 12 hr tablet; Take 1 tablet (40 mg total) by mouth every 6 (six) hours. Please fill on or after 07/25/15 -     Discontinue: oxyCODONE (OXYCONTIN) 80 mg 12 hr tablet; Take 1 tablet (80 mg total) by mouth every 6 (six) hours. Please fill on or after 07/25/15 -     Discontinue: oxyCODONE (OXYCONTIN) 80 mg 12 hr tablet; Take 1 tablet (80 mg total) by mouth every 6 (six) hours. Please fill on or after 08/24/15 -     Discontinue: oxyCODONE (OXYCONTIN) 40 mg 12 hr tablet; Take 1 tablet (40 mg total) by mouth  every 6 (six) hours. Please fill on or after 08/24/15 -     oxyCODONE (OXYCONTIN) 40 mg 12 hr tablet; Take 1 tablet (40 mg total) by mouth every 6 (six) hours. Please fill on or after 09/24/15 -     oxyCODONE (OXYCONTIN) 80 mg 12 hr tablet; Take 1 tablet (80 mg total) by mouth every 6 (six) hours. Please fill on or after 09/24/15 -     Basic metabolic panel; Future -     T4, free; Future -     TSH; Future  Benign lipomatous neoplasm of skin and subcutaneous tissue of left leg -     Discontinue: oxyCODONE (OXYCONTIN) 40 mg 12 hr tablet; Take 1 tablet (40 mg total) by mouth every 6 (six) hours. Please fill on or after 07/25/15 -     Discontinue: oxyCODONE (OXYCONTIN) 80 mg 12 hr tablet; Take 1 tablet (80 mg total) by mouth every 6 (six) hours. Please fill on or after 07/25/15 -     Discontinue: oxyCODONE (OXYCONTIN) 80 mg 12 hr tablet; Take 1 tablet (80 mg total) by mouth every 6 (six) hours. Please fill on or after 08/24/15 -     Discontinue: oxyCODONE (OXYCONTIN) 40 mg 12 hr tablet; Take 1 tablet (40 mg total) by mouth every 6 (six) hours. Please fill on or after 08/24/15 -     oxyCODONE (OXYCONTIN) 40 mg 12 hr tablet; Take 1 tablet (40 mg total) by mouth every 6 (six) hours. Please fill on or after 09/24/15 -     oxyCODONE  (OXYCONTIN) 80 mg 12 hr tablet; Take 1 tablet (80 mg total) by mouth every 6 (six) hours. Please fill on or after 09/24/15 -     Basic metabolic panel; Future -     T4, free; Future -     TSH; Future  Chronic bilateral low back pain without sciatica -     Basic metabolic panel; Future -     T4, free; Future -     TSH; Future  Loss of weight -     Basic metabolic panel; Future -     T4, free; Future -     TSH; Future  Menopause -     DG Bone Density; Future   I have discontinued Ms. Broner's oxyCODONE, oxyCODONE, oxyCODONE, and oxyCODONE. I have also changed her oxyCODONE and oxyCODONE. Additionally, I am having her maintain her aspirin, CALCIUM CITRATE +, COENZYME Q-10 PO, cholecalciferol, albuterol, triamcinolone, furosemide, levothyroxine, and potassium chloride SA.  Meds ordered this encounter  Medications  . DISCONTD: oxyCODONE (OXYCONTIN) 40 mg 12 hr tablet    Sig: Take 1 tablet (40 mg total) by mouth every 6 (six) hours. Please fill on or after 07/25/15    Dispense:  120 tablet    Refill:  0  . DISCONTD: oxyCODONE (OXYCONTIN) 80 mg 12 hr tablet    Sig: Take 1 tablet (80 mg total) by mouth every 6 (six) hours. Please fill on or after 07/25/15    Dispense:  120 tablet    Refill:  0  . DISCONTD: oxyCODONE (OXYCONTIN) 80 mg 12 hr tablet    Sig: Take 1 tablet (80 mg total) by mouth every 6 (six) hours. Please fill on or after 08/24/15    Dispense:  120 tablet    Refill:  0  . DISCONTD: oxyCODONE (OXYCONTIN) 40 mg 12 hr tablet    Sig: Take 1 tablet (40 mg total) by mouth every 6 (six) hours. Please fill on  or after 08/24/15    Dispense:  120 tablet    Refill:  0  . oxyCODONE (OXYCONTIN) 40 mg 12 hr tablet    Sig: Take 1 tablet (40 mg total) by mouth every 6 (six) hours. Please fill on or after 09/24/15    Dispense:  120 tablet    Refill:  0  . oxyCODONE (OXYCONTIN) 80 mg 12 hr tablet    Sig: Take 1 tablet (80 mg total) by mouth every 6 (six) hours. Please fill on or after 09/24/15     Dispense:  120 tablet    Refill:  0     Follow-up: Return in about 3 months (around 10/16/2015) for a follow-up visit.  Walker Kehr, MD

## 2015-07-17 LAB — T4, FREE: Free T4: 0.78 ng/dL (ref 0.60–1.60)

## 2015-07-25 ENCOUNTER — Inpatient Hospital Stay: Admission: RE | Admit: 2015-07-25 | Payer: Medicare Other | Source: Ambulatory Visit

## 2015-10-11 DIAGNOSIS — H04123 Dry eye syndrome of bilateral lacrimal glands: Secondary | ICD-10-CM | POA: Diagnosis not present

## 2015-10-11 DIAGNOSIS — H2513 Age-related nuclear cataract, bilateral: Secondary | ICD-10-CM | POA: Diagnosis not present

## 2015-10-16 ENCOUNTER — Ambulatory Visit (INDEPENDENT_AMBULATORY_CARE_PROVIDER_SITE_OTHER): Payer: Medicare Other | Admitting: Internal Medicine

## 2015-10-16 ENCOUNTER — Encounter: Payer: Self-pay | Admitting: Internal Medicine

## 2015-10-16 VITALS — BP 120/68 | HR 67 | Wt 123.0 lb

## 2015-10-16 DIAGNOSIS — F329 Major depressive disorder, single episode, unspecified: Secondary | ICD-10-CM | POA: Diagnosis not present

## 2015-10-16 DIAGNOSIS — E034 Atrophy of thyroid (acquired): Secondary | ICD-10-CM | POA: Diagnosis not present

## 2015-10-16 DIAGNOSIS — D1724 Benign lipomatous neoplasm of skin and subcutaneous tissue of left leg: Secondary | ICD-10-CM | POA: Diagnosis not present

## 2015-10-16 DIAGNOSIS — M545 Low back pain, unspecified: Secondary | ICD-10-CM

## 2015-10-16 DIAGNOSIS — E038 Other specified hypothyroidism: Secondary | ICD-10-CM | POA: Diagnosis not present

## 2015-10-16 DIAGNOSIS — G8929 Other chronic pain: Secondary | ICD-10-CM

## 2015-10-16 DIAGNOSIS — F32A Depression, unspecified: Secondary | ICD-10-CM

## 2015-10-16 MED ORDER — OXYCODONE HCL ER 80 MG PO T12A: 80.0000 mg | EXTENDED_RELEASE_TABLET | Freq: Four times a day (QID) | ORAL | Status: DC

## 2015-10-16 MED ORDER — OXYCODONE HCL ER 40 MG PO T12A: 40.0000 mg | EXTENDED_RELEASE_TABLET | Freq: Four times a day (QID) | ORAL | Status: DC

## 2015-10-16 NOTE — Assessment & Plan Note (Signed)
Chronic Oxycodone/Oxycontin Rx - long term  Potential benefits of a long term opioids use as well as potential risks (i.e. addiction risk, apnea etc) and complications (i.e. Somnolence, constipation and others) were explained to the patient and were aknowledged. 

## 2015-10-16 NOTE — Assessment & Plan Note (Signed)
Dr Ellison 1/16 - hypothyroid On Levothroid  

## 2015-10-16 NOTE — Progress Notes (Signed)
Subjective:  Patient ID: Abigail Wilson, female    DOB: [birth year]  Age: 55 y.o. MRN: [id number]  CC: No chief complaint on file.   HPI ARRETTA ORIOL presents for LBP, hypothyroidism, allergies f/u  Outpatient Prescriptions Prior to Visit  Medication Sig Dispense Refill  . albuterol (PROVENTIL HFA;VENTOLIN HFA) 108 (90 BASE) MCG/ACT inhaler Plotnikov, Aleksei 8.5 each 1  . aspirin 81 MG EC tablet Take 81 mg by mouth daily.      . Cholecalciferol (VITAMIN D3) 1000 UNITS tablet Take 1,000 Units by mouth daily.      [name] COENZYME Q-10 PO Take by mouth.      . furosemide (LASIX) 40 MG tablet Take 1 tablet (40 mg total) by mouth daily. 90 tablet 3  . levothyroxine (LEVOTHROID) 25 MCG tablet Take 2 tablets (50 mcg total) by mouth daily before breakfast. 180 tablet 3  . oxyCODONE (OXYCONTIN) 40 mg 12 hr tablet Take 1 tablet (40 mg total) by mouth every 6 (six) hours. Please fill on or after 09/24/15 120 tablet 0  . oxyCODONE (OXYCONTIN) 80 mg 12 hr tablet Take 1 tablet (80 mg total) by mouth every 6 (six) hours. Please fill on or after 09/24/15 120 tablet 0  . potassium chloride SA (KLOR-CON M20) 20 MEQ tablet Take 1 tablet (20 mEq total) by mouth daily. 90 tablet 3  . triamcinolone (NASACORT) 55 MCG/ACT AERO nasal inhaler Place 2 sprays into the nose daily. 1 Inhaler 12  . Multiple Minerals-Vitamins (CALCIUM CITRATE +) TABS Take 1 tablet by mouth daily. Reported on 10/16/2015     No facility-administered medications prior to visit.    ROS Review of Systems  Constitutional: Negative for chills, activity change, appetite change, fatigue and unexpected weight change.  HENT: Negative for congestion, mouth sores, sinus pressure and sore throat.   Eyes: Negative for visual disturbance.  Respiratory: Negative for cough and chest tightness.   Gastrointestinal: Negative for nausea and abdominal pain.  Genitourinary: Negative for frequency, difficulty urinating and vaginal pain.  Musculoskeletal:  Positive for back pain. Negative for gait problem.  Skin: Negative for pallor and rash.  Neurological: Negative for dizziness, tremors, weakness, numbness and headaches.  Psychiatric/Behavioral: Negative for suicidal ideas, confusion and sleep disturbance.    Objective:  BP 120/68 mmHg  Pulse 67  Wt 123 lb (55.792 kg)  SpO2 95%  BP Readings from Last 3 Encounters:  10/16/15 120/68  07/16/15 90/58  03/21/15 108/68    Wt Readings from Last 3 Encounters:  10/16/15 123 lb (55.792 kg)  07/16/15 124 lb (56.246 kg)  03/21/15 120 lb 0.6 oz (54.45 kg)    Physical Exam  Constitutional: She appears well-developed. No distress.  HENT:  Head: Normocephalic.  Right Ear: External ear normal.  Left Ear: External ear normal.  Nose: Nose normal.  Mouth/Throat: Oropharynx is clear and moist.  Eyes: Conjunctivae are normal. Pupils are equal, round, and reactive to light. Right eye exhibits no discharge. Left eye exhibits no discharge.  Neck: Normal range of motion. Neck supple. No JVD present. No tracheal deviation present. No thyromegaly present.  Cardiovascular: Normal rate, regular rhythm and normal heart sounds.   Pulmonary/Chest: No stridor. No respiratory distress. She has no wheezes.  Abdominal: Soft. Bowel sounds are normal. She exhibits no distension and no mass. There is no tenderness. There is no rebound and no guarding.  Musculoskeletal: She exhibits tenderness. She exhibits no edema.  Lymphadenopathy:    She has no cervical adenopathy.  Neurological:  She displays normal reflexes. No cranial nerve deficit. She exhibits normal muscle tone. Coordination normal.  Skin: No rash noted. No erythema.  Psychiatric: She has a normal mood and affect. Her behavior is normal. Judgment and thought content normal.  Thin LS tender  Lab Results  Component Value Date   WBC 3.9* 01/02/2015   HGB 13.0 01/02/2015   HCT 37.0 01/02/2015   PLT 221.0 01/02/2015   GLUCOSE 93 07/16/2015   CHOL  203* 01/02/2015   TRIG 46.0 01/02/2015   HDL 114.80 01/02/2015   LDLCALC 79 01/02/2015   ALT 14 01/02/2015   AST 27 01/02/2015   NA 138 07/16/2015   K 3.6 07/16/2015   CL 102 07/16/2015   CREATININE 0.81 07/16/2015   BUN 15 07/16/2015   CO2 29 07/16/2015   TSH 4.28 07/16/2015    Mm Screening Breast Tomo Bilateral  04/18/2014  CLINICAL DATA:  Screening. EXAM: DIGITAL SCREENING BILATERAL MAMMOGRAM WITH 3D TOMO WITH CAD COMPARISON:  Previous exam(s). ACR Breast Density Category c: The breast tissue is heterogeneously dense, which may obscure small masses. FINDINGS: There are no findings suspicious for malignancy. Images were processed with CAD. IMPRESSION: No mammographic evidence of malignancy. A result letter of this screening mammogram will be mailed directly to the patient. RECOMMENDATION: Screening mammogram in one year. (Code:SM-B-01Y) BI-RADS CATEGORY  1: Negative. Electronically Signed   By: Abelardo Diesel M.D.   On: 04/18/2014 14:16    Assessment & Plan:   Diagnoses and all orders for this visit:  Hypothyroidism due to acquired atrophy of thyroid -     oxyCODONE (OXYCONTIN) 40 mg 12 hr tablet; Take 1 tablet (40 mg total) by mouth every 6 (six) hours. Please fill on or after 09/24/15 -     oxyCODONE (OXYCONTIN) 80 mg 12 hr tablet; Take 1 tablet (80 mg total) by mouth every 6 (six) hours. Please fill on or after 09/24/15  Bilateral low back pain without sciatica -     oxyCODONE (OXYCONTIN) 40 mg 12 hr tablet; Take 1 tablet (40 mg total) by mouth every 6 (six) hours. Please fill on or after 09/24/15 -     oxyCODONE (OXYCONTIN) 80 mg 12 hr tablet; Take 1 tablet (80 mg total) by mouth every 6 (six) hours. Please fill on or after 09/24/15  Benign lipomatous neoplasm of skin and subcutaneous tissue of left leg -     oxyCODONE (OXYCONTIN) 40 mg 12 hr tablet; Take 1 tablet (40 mg total) by mouth every 6 (six) hours. Please fill on or after 09/24/15 -     oxyCODONE (OXYCONTIN) 80 mg 12 hr tablet;  Take 1 tablet (80 mg total) by mouth every 6 (six) hours. Please fill on or after 09/24/15   I am having Ms. Mia maintain her aspirin, CALCIUM CITRATE +, COENZYME Q-10 PO, cholecalciferol, albuterol, triamcinolone, furosemide, levothyroxine, potassium chloride SA, oxyCODONE, and oxyCODONE.  No orders of the defined types were placed in this encounter.     Follow-up: No Follow-up on file.  Walker Kehr, MD

## 2015-10-16 NOTE — Assessment & Plan Note (Signed)
11/02/13 father died - grief is better

## 2015-10-16 NOTE — Progress Notes (Signed)
Pre visit review using our clinic review tool, if applicable. No additional management support is needed unless otherwise documented below in the visit note. 

## 2015-10-25 ENCOUNTER — Encounter: Payer: Self-pay | Admitting: Gastroenterology

## 2015-10-28 ENCOUNTER — Other Ambulatory Visit: Payer: Self-pay | Admitting: Internal Medicine

## 2016-01-04 ENCOUNTER — Encounter: Payer: Self-pay | Admitting: Family Medicine

## 2016-01-04 ENCOUNTER — Ambulatory Visit (INDEPENDENT_AMBULATORY_CARE_PROVIDER_SITE_OTHER): Payer: Medicare Other | Admitting: Family Medicine

## 2016-01-04 VITALS — BP 111/73 | HR 71 | Temp 98.2°F | Resp 20 | Wt 121.5 lb

## 2016-01-04 DIAGNOSIS — J0100 Acute maxillary sinusitis, unspecified: Secondary | ICD-10-CM | POA: Diagnosis not present

## 2016-01-04 DIAGNOSIS — J329 Chronic sinusitis, unspecified: Secondary | ICD-10-CM | POA: Insufficient documentation

## 2016-01-04 MED ORDER — AMOXICILLIN-POT CLAVULANATE 875-125 MG PO TABS: 1.0000 | ORAL_TABLET | Freq: Two times a day (BID) | ORAL | Status: DC

## 2016-01-04 NOTE — Progress Notes (Signed)
Patient ID: NATALIA GALLOP, female   DOB: [birth year], 55 y.o.   MRN: [id number]    NIVEYAH PHIPPS , 08/21/1961, 55 y.o., female MRN: [id number]  CC: cough  Subjective: Pt presents for an acute OV with complaints of cough of 6 days duration.  Associated symptoms include nasal congestion, chest congestion, productive cough, fever (at beginning), sinus pressure. Pt denies nausea, vomit or diarrhea.  Her son has been ill. She has an inhaler and uses with illness. She is using about x2 a day recenlty (albuterol),otherwise no dx of COPD/asthma.   She has tried mucinex and staying  hydrated. Former smoker (quit 3 years ago), husband still smokes in the home.  UTD td and flu. Normal GFR >60. Allergies  Allergen Reactions  . Nsaids     REACTION: GI upset  . Trazodone Hcl     REACTION: urinary retention   Social History  Substance Use Topics  . Smoking status: Former Smoker -- 0.25 packs/day    Types: Cigarettes    Quit date: 03/31/2013  . Smokeless tobacco: Not on file  . Alcohol Use: No   Past Medical History  Diagnosis Date  . Osteoporosis   . Hepatitis C   . OA (osteoarthritis of spine)   . Menopause   . Asthmatic bronchitis     Mild  . LBP (low back pain)   . OA (osteoarthritis)   . Depression   . Hyperthyroidism     s/p 131Iodine Rx 2007  . Hypothyroidism    Past Surgical History  Procedure Laterality Date  . Cesarean section     Family History  Problem Relation Age of Onset  . Hypertension Other   . Heart disease Father   . Hyperlipidemia Father   . Hypertension Father   . Diabetes Father      Medication List       This list is accurate as of: 01/04/16  3:14 PM.  Always use your most recent med list.               albuterol 108 (90 Base) MCG/ACT inhaler  Commonly known as:  PROVENTIL HFA;VENTOLIN HFA  Plotnikov, Aleksei     aspirin 81 MG EC tablet  Take 81 mg by mouth daily.     CALCIUM CITRATE + Tabs  Take 1 tablet by mouth daily. Reported on  10/16/2015     cholecalciferol 1000 units tablet  Commonly known as:  VITAMIN D  Take 1,000 Units by mouth daily.     COENZYME Q-10 PO  Take by mouth.     furosemide 40 MG tablet  Commonly known as:  LASIX  Take 1 tablet (40 mg total) by mouth daily.     levothyroxine 25 MCG tablet  Commonly known as:  SYNTHROID, LEVOTHROID  TAKE 2 TABLETS (50 MCG TOTAL) BY MOUTH DAILY BEFORE BREAKFAST.     oxyCODONE 40 mg 12 hr tablet  Commonly known as:  OXYCONTIN  Take 1 tablet (40 mg total) by mouth every 6 (six) hours. Please fill on or after 12/23/15     oxyCODONE 80 mg 12 hr tablet  Commonly known as:  OXYCONTIN  Take 1 tablet (80 mg total) by mouth every 6 (six) hours. Please fill on or after 12/23/15     potassium chloride SA 20 MEQ tablet  Commonly known as:  KLOR-CON M20  Take 1 tablet (20 mEq total) by mouth daily.     triamcinolone 55 MCG/ACT Aero nasal inhaler  Commonly known  as:  NASACORT  Place 2 sprays into the nose daily.       ROS: Negative, with the exception of above mentioned in HPI  Objective:  BP 111/73 mmHg  Pulse 71  Temp(Src) 98.2 F (36.8 C)  Resp 20  Wt 121 lb 8 oz (55.112 kg)  SpO2 98% Body mass index is 20.22 kg/(m^2). Gen: Afebrile. No acute distress. Nontoxic in appearance, thin female. Pleasant.  HENT: AT. [place]. Bilateral TM visualized, left TM with air fluid level, otherwise no erythema or bulging.   MMM, no oral lesions. Bilateral nares with erythema. Throat without erythema or exudates. PND present. Mild cough on exam. TTP sinus cavities. Eyes:Pupils Equal Round Reactive to light, Extraocular movements intact,  Conjunctiva without redness, discharge or icterus. Neck/lymp/endocrine: Supple,ledt ant cervical  lymphadenopathy CV: RRR, no edemaes Chest: CTAB, no wheeze or crackles. Good air movement, normal resp effort.  Abd: Soft. NTND. BS present Skin: No rashes, purpura or petechiae.  Neuro: Normal gait. PERLA. EOMi. Alert. Oriented x3    Assessment/Plan: LENICE SCHMUHL is a 55 y.o. female present for acute OV for  1. Acute maxillary sinusitis, recurrence not specified - rmucinex DM, nasacort restart , Humidifier, Nasal saline, rest, hydrate. - amoxicillin-clavulanate (AUGMENTIN) 875-125 MG tablet; Take 1 tablet by mouth 2 (two) times daily.  Dispense: 20 tablet; Refill: 0  electronically signed by:  Howard Pouch, DO  [place]

## 2016-01-04 NOTE — Patient Instructions (Signed)
Sinusitis, Adult Sinusitis is redness, soreness, and inflammation of the paranasal sinuses. Paranasal sinuses are air pockets within the bones of your face. They are located beneath your eyes, in the middle of your forehead, and above your eyes. In healthy paranasal sinuses, mucus is able to drain out, and air is able to circulate through them by way of your nose. However, when your paranasal sinuses are inflamed, mucus and air can become trapped. This can allow bacteria and other germs to grow and cause infection. Sinusitis can develop quickly and last only a short time (acute) or continue over a long period (chronic). Sinusitis that lasts for more than 12 weeks is considered chronic. CAUSES Causes of sinusitis include:  Allergies.  Structural abnormalities, such as displacement of the cartilage that separates your nostrils (deviated septum), which can decrease the air flow through your nose and sinuses and affect sinus drainage.  Functional abnormalities, such as when the small hairs (cilia) that line your sinuses and help remove mucus do not work properly or are not present. SIGNS AND SYMPTOMS Symptoms of acute and chronic sinusitis are the same. The primary symptoms are pain and pressure around the affected sinuses. Other symptoms include:  Upper toothache.  Earache.  Headache.  Bad breath.  Decreased sense of smell and taste.  A cough, which worsens when you are lying flat.  Fatigue.  Fever.  Thick drainage from your nose, which often is green and may contain pus (purulent).  Swelling and warmth over the affected sinuses. DIAGNOSIS Your health care provider will perform a physical exam. During your exam, your health care provider may perform any of the following to help determine if you have acute sinusitis or chronic sinusitis:  Look in your nose for signs of abnormal growths in your nostrils (nasal polyps).  Tap over the affected sinus to check for signs of  infection.  View the inside of your sinuses using an imaging device that has a light attached (endoscope). If your health care provider suspects that you have chronic sinusitis, one or more of the following tests may be recommended:  Allergy tests.  Nasal culture. A sample of mucus is taken from your nose, sent to a lab, and screened for bacteria.  Nasal cytology. A sample of mucus is taken from your nose and examined by your health care provider to determine if your sinusitis is related to an allergy. TREATMENT Most cases of acute sinusitis are related to a viral infection and will resolve on their own within 10 days. Sometimes, medicines are prescribed to help relieve symptoms of both acute and chronic sinusitis. These may include pain medicines, decongestants, nasal steroid sprays, or saline sprays. However, for sinusitis related to a bacterial infection, your health care provider will prescribe antibiotic medicines. These are medicines that will help kill the bacteria causing the infection. Rarely, sinusitis is caused by a fungal infection. In these cases, your health care provider will prescribe antifungal medicine. For some cases of chronic sinusitis, surgery is needed. Generally, these are cases in which sinusitis recurs more than 3 times per year, despite other treatments. HOME CARE INSTRUCTIONS  Drink plenty of water. Water helps thin the mucus so your sinuses can drain more easily.  Use a humidifier.  Inhale steam 3-4 times a day (for example, sit in the bathroom with the shower running).  Apply a warm, moist washcloth to your face 3-4 times a day, or as directed by your health care provider.  Use saline nasal sprays to help   moisten and clean your sinuses.  Take medicines only as directed by your health care provider.  If you were prescribed either an antibiotic or antifungal medicine, finish it all even if you start to feel better. SEEK IMMEDIATE MEDICAL CARE IF:  You have  increasing pain or severe headaches.  You have nausea, vomiting, or drowsiness.  You have swelling around your face.  You have vision problems.  You have a stiff neck.  You have difficulty breathing.   This information is not intended to replace advice given to you by your health care provider. Make sure you discuss any questions you have with your health care provider.   Document Released: 09/01/2005 Document Revised: 09/22/2014 Document Reviewed: 09/16/2011 Elsevier Interactive Patient Education 2016 [employer]  augmentin prescribed  mucinex DM nasacort restart  Humidifer Nasal saline

## 2016-01-15 ENCOUNTER — Ambulatory Visit (INDEPENDENT_AMBULATORY_CARE_PROVIDER_SITE_OTHER): Payer: Medicare Other | Admitting: Internal Medicine

## 2016-01-15 ENCOUNTER — Encounter: Payer: Self-pay | Admitting: Internal Medicine

## 2016-01-15 VITALS — BP 120/72 | HR 64 | Temp 98.2°F | Ht 65.0 in | Wt 119.1 lb

## 2016-01-15 DIAGNOSIS — E034 Atrophy of thyroid (acquired): Secondary | ICD-10-CM

## 2016-01-15 DIAGNOSIS — M5440 Lumbago with sciatica, unspecified side: Secondary | ICD-10-CM | POA: Diagnosis not present

## 2016-01-15 DIAGNOSIS — F329 Major depressive disorder, single episode, unspecified: Secondary | ICD-10-CM | POA: Diagnosis not present

## 2016-01-15 DIAGNOSIS — E038 Other specified hypothyroidism: Secondary | ICD-10-CM

## 2016-01-15 DIAGNOSIS — E890 Postprocedural hypothyroidism: Secondary | ICD-10-CM | POA: Diagnosis not present

## 2016-01-15 DIAGNOSIS — R634 Abnormal weight loss: Secondary | ICD-10-CM

## 2016-01-15 DIAGNOSIS — F32A Depression, unspecified: Secondary | ICD-10-CM

## 2016-01-15 DIAGNOSIS — M545 Low back pain, unspecified: Secondary | ICD-10-CM

## 2016-01-15 DIAGNOSIS — D1724 Benign lipomatous neoplasm of skin and subcutaneous tissue of left leg: Secondary | ICD-10-CM

## 2016-01-15 MED ORDER — OXYCODONE HCL ER 80 MG PO T12A: 80.0000 mg | EXTENDED_RELEASE_TABLET | Freq: Four times a day (QID) | ORAL | Status: DC

## 2016-01-15 MED ORDER — OXYCODONE HCL ER 40 MG PO T12A: 40.0000 mg | EXTENDED_RELEASE_TABLET | Freq: Four times a day (QID) | ORAL | Status: DC

## 2016-01-15 MED ORDER — OXYCODONE HCL ER 80 MG PO T12A
80.0000 mg | EXTENDED_RELEASE_TABLET | Freq: Four times a day (QID) | ORAL | Status: DC
Start: 2016-01-15 — End: 2016-01-15

## 2016-01-15 NOTE — Assessment & Plan Note (Signed)
On Levothroid 

## 2016-01-15 NOTE — Progress Notes (Signed)
Subjective:  Patient ID: Abigail Wilson, female    DOB: [birth year]  Age: 55 y.o. MRN: [id number]  CC: No chief complaint on file.   HPI Abigail Wilson presents for LBP, hypothyroidism on Levothroid, COPD f/u  Outpatient Prescriptions Prior to Visit  Medication Sig Dispense Refill  . albuterol (PROVENTIL HFA;VENTOLIN HFA) 108 (90 BASE) MCG/ACT inhaler Rossanna Spitzley 8.5 each 1  . aspirin 81 MG EC tablet Take 81 mg by mouth daily.      . Cholecalciferol (VITAMIN D3) 1000 UNITS tablet Take 1,000 Units by mouth daily.      [name] COENZYME Q-10 PO Take by mouth.      . furosemide (LASIX) 40 MG tablet Take 1 tablet (40 mg total) by mouth daily. 90 tablet 3  . levothyroxine (SYNTHROID, LEVOTHROID) 25 MCG tablet TAKE 2 TABLETS (50 MCG TOTAL) BY MOUTH DAILY BEFORE BREAKFAST. 60 tablet 11  . oxyCODONE (OXYCONTIN) 40 mg 12 hr tablet Take 1 tablet (40 mg total) by mouth every 6 (six) hours. Please fill on or after 12/23/15 120 tablet 0  . oxyCODONE (OXYCONTIN) 80 mg 12 hr tablet Take 1 tablet (80 mg total) by mouth every 6 (six) hours. Please fill on or after 12/23/15 120 tablet 0  . potassium chloride SA (KLOR-CON M20) 20 MEQ tablet Take 1 tablet (20 mEq total) by mouth daily. 90 tablet 3  . triamcinolone (NASACORT) 55 MCG/ACT AERO nasal inhaler Place 2 sprays into the nose daily. 1 Inhaler 12  . amoxicillin-clavulanate (AUGMENTIN) 875-125 MG tablet Take 1 tablet by mouth 2 (two) times daily. 20 tablet 0  . Multiple Minerals-Vitamins (CALCIUM CITRATE +) TABS Take 1 tablet by mouth daily. Reported on 10/16/2015     No facility-administered medications prior to visit.    ROS Review of Systems  Constitutional: Negative for chills, activity change, appetite change, fatigue and unexpected weight change.  HENT: Negative for congestion, mouth sores and sinus pressure.   Eyes: Negative for visual disturbance.  Respiratory: Negative for cough and chest tightness.   Gastrointestinal: Negative for  nausea and abdominal pain.  Genitourinary: Negative for frequency, difficulty urinating and vaginal pain.  Musculoskeletal: Positive for back pain. Negative for gait problem.  Skin: Negative for pallor and rash.  Neurological: Negative for dizziness, tremors, weakness, numbness and headaches.  Psychiatric/Behavioral: Negative for suicidal ideas, confusion and sleep disturbance.    Objective:  BP 120/72 mmHg  Pulse 64  Temp(Src) 98.2 F (36.8 C) (Oral)  Ht 5\' 5"  (1.651 m)  Wt 119 lb 2 oz (54.035 kg)  BMI 19.82 kg/m2  SpO2 98%  BP Readings from Last 3 Encounters:  01/15/16 120/72  01/04/16 111/73  10/16/15 120/68    Wt Readings from Last 3 Encounters:  01/15/16 119 lb 2 oz (54.035 kg)  01/04/16 121 lb 8 oz (55.112 kg)  10/16/15 123 lb (55.792 kg)    Physical Exam  Constitutional: She appears well-developed. No distress.  HENT:  Head: Normocephalic.  Right Ear: External ear normal.  Left Ear: External ear normal.  Nose: Nose normal.  Mouth/Throat: Oropharynx is clear and moist.  Eyes: Conjunctivae are normal. Pupils are equal, round, and reactive to light. Right eye exhibits no discharge. Left eye exhibits no discharge.  Neck: Normal range of motion. Neck supple. No JVD present. No tracheal deviation present. No thyromegaly present.  Cardiovascular: Normal rate, regular rhythm and normal heart sounds.   Pulmonary/Chest: No stridor. No respiratory distress. She has no wheezes.  Abdominal: Soft. Bowel sounds  are normal. She exhibits no distension and no mass. There is no tenderness. There is no rebound and no guarding.  Musculoskeletal: She exhibits tenderness. She exhibits no edema.  Lymphadenopathy:    She has no cervical adenopathy.  Neurological: She displays normal reflexes. No cranial nerve deficit. She exhibits normal muscle tone. Coordination normal.  Skin: No rash noted. No erythema.  Psychiatric: Her behavior is normal. Judgment and thought content normal.     Lab Results  Component Value Date   WBC 3.9* 01/02/2015   HGB 13.0 01/02/2015   HCT 37.0 01/02/2015   PLT 221.0 01/02/2015   GLUCOSE 93 07/16/2015   CHOL 203* 01/02/2015   TRIG 46.0 01/02/2015   HDL 114.80 01/02/2015   LDLCALC 79 01/02/2015   ALT 14 01/02/2015   AST 27 01/02/2015   NA 138 07/16/2015   K 3.6 07/16/2015   CL 102 07/16/2015   CREATININE 0.81 07/16/2015   BUN 15 07/16/2015   CO2 29 07/16/2015   TSH 4.28 07/16/2015    Mm Screening Breast Tomo Bilateral  04/18/2014  CLINICAL DATA:  Screening. EXAM: DIGITAL SCREENING BILATERAL MAMMOGRAM WITH 3D TOMO WITH CAD COMPARISON:  Previous exam(s). ACR Breast Density Category c: The breast tissue is heterogeneously dense, which may obscure small masses. FINDINGS: There are no findings suspicious for malignancy. Images were processed with CAD. IMPRESSION: No mammographic evidence of malignancy. A result letter of this screening mammogram will be mailed directly to the patient. RECOMMENDATION: Screening mammogram in one year. (Code:SM-B-01Y) BI-RADS CATEGORY  1: Negative. Electronically Signed   By: Abelardo Diesel M.D.   On: 04/18/2014 14:16    Assessment & Plan:   Diagnoses and all orders for this visit:  Acute right-sided low back pain with sciatica, sciatica laterality unspecified  Postoperative hypothyroidism  Depression   I have discontinued Ms. Byrum's CALCIUM CITRATE + and amoxicillin-clavulanate. I am also having her maintain her aspirin, COENZYME Q-10 PO, cholecalciferol, albuterol, triamcinolone, furosemide, potassium chloride SA, oxyCODONE, oxyCODONE, and levothyroxine.  No orders of the defined types were placed in this encounter.     Follow-up: Return in about 3 months (around 04/16/2016) for a follow-up visit.  Walker Kehr, MD

## 2016-01-15 NOTE — Assessment & Plan Note (Signed)
Wt Readings from Last 3 Encounters:  01/15/16 119 lb 2 oz (54.035 kg)  01/04/16 121 lb 8 oz (55.112 kg)  10/16/15 123 lb (55.792 kg)

## 2016-01-15 NOTE — Assessment & Plan Note (Signed)
Chronic Oxycodone/Oxycontin Rx - long term  Potential benefits of a long term opioids use as well as potential risks (i.e. addiction risk, apnea etc) and complications (i.e. Somnolence, constipation and others) were explained to the patient and were aknowledged. 

## 2016-01-15 NOTE — Assessment & Plan Note (Signed)
Doing fair 

## 2016-01-15 NOTE — Progress Notes (Signed)
Pre visit review using our clinic review tool, if applicable. No additional management support is needed unless otherwise documented below in the visit note. 

## 2016-04-22 ENCOUNTER — Encounter: Payer: Self-pay | Admitting: Internal Medicine

## 2016-04-22 ENCOUNTER — Ambulatory Visit (INDEPENDENT_AMBULATORY_CARE_PROVIDER_SITE_OTHER): Payer: Medicare Other | Admitting: Internal Medicine

## 2016-04-22 VITALS — BP 120/80 | HR 75 | Wt 118.0 lb

## 2016-04-22 DIAGNOSIS — M545 Low back pain, unspecified: Secondary | ICD-10-CM

## 2016-04-22 DIAGNOSIS — E785 Hyperlipidemia, unspecified: Secondary | ICD-10-CM | POA: Diagnosis not present

## 2016-04-22 DIAGNOSIS — D1724 Benign lipomatous neoplasm of skin and subcutaneous tissue of left leg: Secondary | ICD-10-CM | POA: Diagnosis not present

## 2016-04-22 DIAGNOSIS — E038 Other specified hypothyroidism: Secondary | ICD-10-CM

## 2016-04-22 DIAGNOSIS — Z0000 Encounter for general adult medical examination without abnormal findings: Secondary | ICD-10-CM

## 2016-04-22 DIAGNOSIS — E034 Atrophy of thyroid (acquired): Secondary | ICD-10-CM | POA: Diagnosis not present

## 2016-04-22 MED ORDER — OXYCODONE HCL ER 40 MG PO T12A: 40.0000 mg | EXTENDED_RELEASE_TABLET | Freq: Four times a day (QID) | ORAL | 0 refills | Status: DC

## 2016-04-22 MED ORDER — OXYCODONE HCL ER 80 MG PO T12A: 80.0000 mg | EXTENDED_RELEASE_TABLET | Freq: Four times a day (QID) | ORAL | 0 refills | Status: DC

## 2016-04-22 MED ORDER — ALBUTEROL SULFATE HFA 108 (90 BASE) MCG/ACT IN AERS: 1.0000 | INHALATION_SPRAY | Freq: Four times a day (QID) | RESPIRATORY_TRACT | 1 refills | Status: DC | PRN

## 2016-04-22 NOTE — Progress Notes (Signed)
Pre visit review using our clinic review tool, if applicable. No additional management support is needed unless otherwise documented below in the visit note. 

## 2016-04-22 NOTE — Progress Notes (Signed)
Subjective:  Patient ID: Abigail Wilson, female    DOB: 23-Mar-1961  Age: 54 y.o. MRN: [id number]  CC: No chief complaint on file.   HPI Abigail Wilson presents for LBP, hypothyroidism, COPD f/u  Outpatient Medications Prior to Visit  Medication Sig Dispense Refill  . albuterol (PROVENTIL HFA;VENTOLIN HFA) 108 (90 BASE) MCG/ACT inhaler [name] 8.5 each 1  . aspirin 81 MG EC tablet Take 81 mg by mouth daily.      [name] COENZYME Q-10 PO Take 200 mg by mouth daily.     . furosemide (LASIX) 40 MG tablet Take 1 tablet (40 mg total) by mouth daily. 90 tablet 3  . levothyroxine (SYNTHROID, LEVOTHROID) 25 MCG tablet TAKE 2 TABLETS (50 MCG TOTAL) BY MOUTH DAILY BEFORE BREAKFAST. 60 tablet 11  . oxyCODONE (OXYCONTIN) 40 mg 12 hr tablet Take 1 tablet (40 mg total) by mouth every 6 (six) hours. Please fill on or after 03/23/16 120 tablet 0  . oxyCODONE (OXYCONTIN) 80 mg 12 hr tablet Take 1 tablet (80 mg total) by mouth every 6 (six) hours. Please fill on or after 03/23/16 120 tablet 0  . potassium chloride SA (KLOR-CON M20) 20 MEQ tablet Take 1 tablet (20 mEq total) by mouth daily. 90 tablet 3  . triamcinolone (NASACORT) 55 MCG/ACT AERO nasal inhaler Place 2 sprays into the nose daily. 1 Inhaler 12  . Cholecalciferol (VITAMIN D3) 1000 UNITS tablet Take 1,000 Units by mouth daily.       No facility-administered medications prior to visit.     ROS Review of Systems  Constitutional: Negative for activity change, appetite change, chills, fatigue and unexpected weight change.  HENT: Negative for congestion, mouth sores and sinus pressure.   Eyes: Negative for visual disturbance.  Respiratory: Negative for cough and chest tightness.   Gastrointestinal: Negative for abdominal pain and nausea.  Genitourinary: Negative for difficulty urinating, frequency and vaginal pain.  Musculoskeletal: Positive for back pain. Negative for gait problem.  Skin: Negative for pallor and rash.  Neurological:  Negative for dizziness, tremors, weakness, numbness and headaches.  Psychiatric/Behavioral: Negative for confusion and sleep disturbance.    Objective:  BP 120/80   Pulse 75   Wt 118 lb (53.5 kg)   SpO2 96%   BMI 19.64 kg/m   BP Readings from Last 3 Encounters:  04/22/16 120/80  01/15/16 120/72  01/04/16 111/73    Wt Readings from Last 3 Encounters:  04/22/16 118 lb (53.5 kg)  01/15/16 119 lb 2 oz (54 kg)  01/04/16 121 lb 8 oz (55.1 kg)    Physical Exam  Constitutional: She appears well-developed. No distress.  HENT:  Head: Normocephalic.  Right Ear: External ear normal.  Left Ear: External ear normal.  Nose: Nose normal.  Mouth/Throat: Oropharynx is clear and moist.  Eyes: Conjunctivae are normal. Pupils are equal, round, and reactive to light. Right eye exhibits no discharge. Left eye exhibits no discharge.  Neck: Normal range of motion. Neck supple. No JVD present. No tracheal deviation present. No thyromegaly present.  Cardiovascular: Normal rate, regular rhythm and normal heart sounds.   Pulmonary/Chest: No stridor. No respiratory distress. She has no wheezes.  Abdominal: Soft. Bowel sounds are normal. She exhibits no distension and no mass. There is no tenderness. There is no rebound and no guarding.  Musculoskeletal: She exhibits tenderness. She exhibits no edema.  Lymphadenopathy:    She has no cervical adenopathy.  Neurological: She displays normal reflexes. No cranial nerve deficit. She  exhibits normal muscle tone. Coordination normal.  Skin: No rash noted. No erythema.  Psychiatric: Her behavior is normal. Judgment and thought content normal.  LBP  Lab Results  Component Value Date   WBC 3.9 (L) 01/02/2015   HGB 13.0 01/02/2015   HCT 37.0 01/02/2015   PLT 221.0 01/02/2015   GLUCOSE 93 07/16/2015   CHOL 203 (H) 01/02/2015   TRIG 46.0 01/02/2015   HDL 114.80 01/02/2015   LDLCALC 79 01/02/2015   ALT 14 01/02/2015   AST 27 01/02/2015   NA 138  07/16/2015   K 3.6 07/16/2015   CL 102 07/16/2015   CREATININE 0.81 07/16/2015   BUN 15 07/16/2015   CO2 29 07/16/2015   TSH 4.28 07/16/2015    Mm Screening Breast Tomo Bilateral  Result Date: 04/18/2014 CLINICAL DATA:  Screening. EXAM: DIGITAL SCREENING BILATERAL MAMMOGRAM WITH 3D TOMO WITH CAD COMPARISON:  Previous exam(s). ACR Breast Density Category c: The breast tissue is heterogeneously dense, which may obscure small masses. FINDINGS: There are no findings suspicious for malignancy. Images were processed with CAD. IMPRESSION: No mammographic evidence of malignancy. A result letter of this screening mammogram will be mailed directly to the patient. RECOMMENDATION: Screening mammogram in one year. (Code:SM-B-01Y) BI-RADS CATEGORY  1: Negative. Electronically Signed   By: Abelardo Diesel M.D.   On: 04/18/2014 14:16    Assessment & Plan:   Diagnoses and all orders for this visit:  Hypothyroidism due to acquired atrophy of thyroid -     oxyCODONE (OXYCONTIN) 40 mg 12 hr tablet; Take 1 tablet (40 mg total) by mouth every 6 (six) hours. Please fill on or after 03/23/16 -     oxyCODONE (OXYCONTIN) 80 mg 12 hr tablet; Take 1 tablet (80 mg total) by mouth every 6 (six) hours. Please fill on or after 03/23/16  Bilateral low back pain without sciatica -     oxyCODONE (OXYCONTIN) 40 mg 12 hr tablet; Take 1 tablet (40 mg total) by mouth every 6 (six) hours. Please fill on or after 03/23/16 -     oxyCODONE (OXYCONTIN) 80 mg 12 hr tablet; Take 1 tablet (80 mg total) by mouth every 6 (six) hours. Please fill on or after 03/23/16  Benign lipomatous neoplasm of skin and subcutaneous tissue of left leg -     oxyCODONE (OXYCONTIN) 40 mg 12 hr tablet; Take 1 tablet (40 mg total) by mouth every 6 (six) hours. Please fill on or after 03/23/16 -     oxyCODONE (OXYCONTIN) 80 mg 12 hr tablet; Take 1 tablet (80 mg total) by mouth every 6 (six) hours. Please fill on or after 03/23/16  Other orders -     albuterol  (PROVENTIL HFA;VENTOLIN HFA) 108 (90 Base) MCG/ACT inhaler; Inhale 1-2 puffs into the lungs every 6 (six) hours as needed for wheezing or shortness of breath.   I am having Abigail Wilson maintain her aspirin, COENZYME Q-10 PO, albuterol, triamcinolone, furosemide, potassium chloride SA, levothyroxine, oxyCODONE, oxyCODONE, and Cholecalciferol.  Meds ordered this encounter  Medications  . Cholecalciferol 3000 units TABS    Sig: Take 2 tablets by mouth daily.     Follow-up: No Follow-up on file.  Walker Kehr, MD

## 2016-05-11 ENCOUNTER — Other Ambulatory Visit: Payer: Self-pay | Admitting: Internal Medicine

## 2016-06-16 ENCOUNTER — Ambulatory Visit (INDEPENDENT_AMBULATORY_CARE_PROVIDER_SITE_OTHER): Payer: Medicare Other

## 2016-06-16 ENCOUNTER — Telehealth: Payer: Self-pay

## 2016-06-16 DIAGNOSIS — Z23 Encounter for immunization: Secondary | ICD-10-CM

## 2016-06-16 NOTE — Telephone Encounter (Signed)
Patient came in for flu shot today (on nurse schedule)---patient is requesting high dose flu vaccine, she is 55 years old---I have asked dr Alain Marion if that would be ok, per dr Alain Marion he feels she only needs regular dose flu vaccine---but patient insists on getting high dose flu because of existing lung conditions and patient is aware that she may end up paying out of pocket expense because insurance may not cover cost

## 2016-07-21 ENCOUNTER — Other Ambulatory Visit (INDEPENDENT_AMBULATORY_CARE_PROVIDER_SITE_OTHER): Payer: Medicare Other

## 2016-07-21 DIAGNOSIS — R945 Abnormal results of liver function studies: Secondary | ICD-10-CM | POA: Diagnosis not present

## 2016-07-21 DIAGNOSIS — E785 Hyperlipidemia, unspecified: Secondary | ICD-10-CM

## 2016-07-21 DIAGNOSIS — D1724 Benign lipomatous neoplasm of skin and subcutaneous tissue of left leg: Secondary | ICD-10-CM

## 2016-07-21 DIAGNOSIS — M545 Low back pain, unspecified: Secondary | ICD-10-CM

## 2016-07-21 DIAGNOSIS — E034 Atrophy of thyroid (acquired): Secondary | ICD-10-CM | POA: Diagnosis not present

## 2016-07-21 DIAGNOSIS — Z0000 Encounter for general adult medical examination without abnormal findings: Secondary | ICD-10-CM

## 2016-07-21 LAB — HEPATIC FUNCTION PANEL
ALBUMIN: 4.4 g/dL (ref 3.5–5.2)
ALK PHOS: 46 U/L (ref 39–117)
ALT: 12 U/L (ref 0–35)
AST: 21 U/L (ref 0–37)
BILIRUBIN DIRECT: 0 mg/dL (ref 0.0–0.3)
Total Bilirubin: 0.3 mg/dL (ref 0.2–1.2)
Total Protein: 7 g/dL (ref 6.0–8.3)

## 2016-07-21 LAB — LIPID PANEL
Cholesterol: 214 mg/dL — ABNORMAL HIGH (ref 0–200)
HDL: 107.4 mg/dL (ref >39.00)
LDL Cholesterol: 83 mg/dL (ref 0–99)
NONHDL: 106.47
Total CHOL/HDL Ratio: 2
Triglycerides: 115 mg/dL (ref 0.0–149.0)
VLDL: 23 mg/dL (ref 0.0–40.0)

## 2016-07-21 LAB — CBC WITH DIFFERENTIAL/PLATELET
BASOS ABS: 0 10*3/uL (ref 0.0–0.1)
Basophils Relative: 0.3 % (ref 0.0–3.0)
EOS ABS: 0.3 10*3/uL (ref 0.0–0.7)
Eosinophils Relative: 5.5 % — ABNORMAL HIGH (ref 0.0–5.0)
HCT: 39.7 % (ref 36.0–46.0)
Hemoglobin: 13.6 g/dL (ref 12.0–15.0)
LYMPHS ABS: 2.1 10*3/uL (ref 0.7–4.0)
Lymphocytes Relative: 37.5 % (ref 12.0–46.0)
MCHC: 34.3 g/dL (ref 30.0–36.0)
MCV: 99.8 fl (ref 78.0–100.0)
MONO ABS: 0.5 10*3/uL (ref 0.1–1.0)
Monocytes Relative: 9.2 % (ref 3.0–12.0)
NEUTROS ABS: 2.6 10*3/uL (ref 1.4–7.7)
NEUTROS PCT: 47.5 % (ref 43.0–77.0)
PLATELETS: 241 10*3/uL (ref 150.0–400.0)
RBC: 3.98 Mil/uL (ref 3.87–5.11)
RDW: 15.3 % (ref 11.5–15.5)
WBC: 5.5 10*3/uL (ref 4.0–10.5)

## 2016-07-21 LAB — URINALYSIS
BILIRUBIN URINE: NEGATIVE
Hgb urine dipstick: NEGATIVE
KETONES UR: NEGATIVE
LEUKOCYTES UA: NEGATIVE
Nitrite: NEGATIVE
Specific Gravity, Urine: 1.02 (ref 1.000–1.030)
Total Protein, Urine: NEGATIVE
UROBILINOGEN UA: 0.2 (ref 0.0–1.0)
Urine Glucose: NEGATIVE
pH: 6 (ref 5.0–8.0)

## 2016-07-21 LAB — BASIC METABOLIC PANEL
BUN: 18 mg/dL (ref 6–23)
CALCIUM: 9.5 mg/dL (ref 8.4–10.5)
CO2: 32 meq/L (ref 19–32)
CREATININE: 0.78 mg/dL (ref 0.40–1.20)
Chloride: 103 mEq/L (ref 96–112)
GFR: 81.01 mL/min (ref >60.00)
GLUCOSE: 88 mg/dL (ref 70–99)
Potassium: 4.6 mEq/L (ref 3.5–5.1)
Sodium: 141 mEq/L (ref 135–145)

## 2016-07-21 LAB — TSH: TSH: 5.25 u[IU]/mL — AB (ref 0.35–4.50)

## 2016-07-22 ENCOUNTER — Ambulatory Visit (INDEPENDENT_AMBULATORY_CARE_PROVIDER_SITE_OTHER): Payer: Medicare Other | Admitting: Internal Medicine

## 2016-07-22 ENCOUNTER — Encounter: Payer: Self-pay | Admitting: Internal Medicine

## 2016-07-22 DIAGNOSIS — M5440 Lumbago with sciatica, unspecified side: Secondary | ICD-10-CM

## 2016-07-22 DIAGNOSIS — E034 Atrophy of thyroid (acquired): Secondary | ICD-10-CM | POA: Diagnosis not present

## 2016-07-22 DIAGNOSIS — Z0000 Encounter for general adult medical examination without abnormal findings: Secondary | ICD-10-CM | POA: Diagnosis not present

## 2016-07-22 DIAGNOSIS — Z8619 Personal history of other infectious and parasitic diseases: Secondary | ICD-10-CM

## 2016-07-22 DIAGNOSIS — F329 Major depressive disorder, single episode, unspecified: Secondary | ICD-10-CM

## 2016-07-22 LAB — HEPATITIS C ANTIBODY: HCV Ab: REACTIVE — AB

## 2016-07-22 MED ORDER — OXYCODONE HCL ER 80 MG PO T12A: 80.0000 mg | EXTENDED_RELEASE_TABLET | Freq: Four times a day (QID) | ORAL | 0 refills | Status: DC

## 2016-07-22 MED ORDER — OXYCODONE HCL ER 40 MG PO T12A: 40.0000 mg | EXTENDED_RELEASE_TABLET | Freq: Four times a day (QID) | ORAL | 0 refills | Status: DC

## 2016-07-22 MED ORDER — POTASSIUM CHLORIDE CRYS ER 20 MEQ PO TBCR: 20.0000 meq | EXTENDED_RELEASE_TABLET | Freq: Every day | ORAL | 3 refills | Status: DC

## 2016-07-22 NOTE — Assessment & Plan Note (Signed)
Oxycodone/Oxycontin Rx

## 2016-07-22 NOTE — Assessment & Plan Note (Signed)
2003 treated w/Interferon

## 2016-07-22 NOTE — Assessment & Plan Note (Signed)
Doing well 

## 2016-07-22 NOTE — Progress Notes (Signed)
Subjective:  Patient ID: Abigail Wilson, female    DOB: [birth year]  Age: 55 y.o. MRN: [id number]  CC: No chief complaint on file.   HPI KELLIANN TAJIMA presents for a well exam  Outpatient Medications Prior to Visit  Medication Sig Dispense Refill  . albuterol (PROVENTIL HFA;VENTOLIN HFA) 108 (90 Base) MCG/ACT inhaler Inhale 1-2 puffs into the lungs every 6 (six) hours as needed for wheezing or shortness of breath. 8.5 each 1  . aspirin 81 MG EC tablet Take 81 mg by mouth daily.      . Cholecalciferol 3000 units TABS Take 2 tablets by mouth daily.    [name] COENZYME Q-10 PO Take 200 mg by mouth daily.     . furosemide (LASIX) 40 MG tablet TAKE 1 TABLET (40 MG TOTAL) BY MOUTH DAILY. 90 tablet 3  . levothyroxine (SYNTHROID, LEVOTHROID) 25 MCG tablet TAKE 2 TABLETS (50 MCG TOTAL) BY MOUTH DAILY BEFORE BREAKFAST. 60 tablet 11  . oxyCODONE (OXYCONTIN) 40 mg 12 hr tablet Take 1 tablet (40 mg total) by mouth every 6 (six) hours. Please fill on or after 06/23/16 120 tablet 0  . oxyCODONE (OXYCONTIN) 80 mg 12 hr tablet Take 1 tablet (80 mg total) by mouth every 6 (six) hours. Please fill on or after 06/23/16 120 tablet 0  . potassium chloride SA (KLOR-CON M20) 20 MEQ tablet Take 1 tablet (20 mEq total) by mouth daily. 90 tablet 3  . triamcinolone (NASACORT) 55 MCG/ACT AERO nasal inhaler Place 2 sprays into the nose daily. 1 Inhaler 12   No facility-administered medications prior to visit.     ROS Review of Systems  Constitutional: Negative for activity change, appetite change, chills, fatigue and unexpected weight change.  HENT: Negative for congestion, mouth sores and sinus pressure.   Eyes: Negative for visual disturbance.  Respiratory: Negative for cough and chest tightness.   Gastrointestinal: Negative for abdominal pain and nausea.  Genitourinary: Negative for difficulty urinating, frequency and vaginal pain.  Musculoskeletal: Positive for back pain. Negative for gait problem.  Skin:  Negative for pallor and rash.  Neurological: Negative for dizziness, tremors, weakness, numbness and headaches.  Psychiatric/Behavioral: Negative for confusion, sleep disturbance and suicidal ideas.    Objective:  BP 110/68   Pulse 70   Ht 5\' 5"  (1.651 m)   Wt 119 lb (54 kg)   SpO2 98%   BMI 19.80 kg/m   BP Readings from Last 3 Encounters:  07/22/16 110/68  04/22/16 120/80  01/15/16 120/72    Wt Readings from Last 3 Encounters:  07/22/16 119 lb (54 kg)  04/22/16 118 lb (53.5 kg)  01/15/16 119 lb 2 oz (54 kg)    Physical Exam  Constitutional: She appears well-developed. No distress.  HENT:  Head: Normocephalic.  Right Ear: External ear normal.  Left Ear: External ear normal.  Nose: Nose normal.  Mouth/Throat: Oropharynx is clear and moist.  Eyes: Conjunctivae are normal. Pupils are equal, round, and reactive to light. Right eye exhibits no discharge. Left eye exhibits no discharge.  Neck: Normal range of motion. Neck supple. No JVD present. No tracheal deviation present. No thyromegaly present.  Cardiovascular: Normal rate, regular rhythm and normal heart sounds.   Pulmonary/Chest: No stridor. No respiratory distress. She has no wheezes.  Abdominal: Soft. Bowel sounds are normal. She exhibits no distension and no mass. There is no tenderness. There is no rebound and no guarding.  Musculoskeletal: She exhibits tenderness. She exhibits no edema.  Lymphadenopathy:  She has no cervical adenopathy.  Neurological: She displays normal reflexes. No cranial nerve deficit. She exhibits normal muscle tone. Coordination normal.  Skin: No rash noted. No erythema.  Psychiatric: She has a normal mood and affect. Her behavior is normal. Judgment and thought content normal.    Lab Results  Component Value Date   WBC 5.5 07/21/2016   HGB 13.6 07/21/2016   HCT 39.7 07/21/2016   PLT 241.0 07/21/2016   GLUCOSE 88 07/21/2016   CHOL 214 (H) 07/21/2016   TRIG 115.0 07/21/2016   HDL  107.40 07/21/2016   LDLCALC 83 07/21/2016   ALT 12 07/21/2016   AST 21 07/21/2016   NA 141 07/21/2016   K 4.6 07/21/2016   CL 103 07/21/2016   CREATININE 0.78 07/21/2016   BUN 18 07/21/2016   CO2 32 07/21/2016   TSH 5.25 (H) 07/21/2016    Mm Screening Breast Tomo Bilateral  Result Date: 04/18/2014 CLINICAL DATA:  Screening. EXAM: DIGITAL SCREENING BILATERAL MAMMOGRAM WITH 3D TOMO WITH CAD COMPARISON:  Previous exam(s). ACR Breast Density Category c: The breast tissue is heterogeneously dense, which may obscure small masses. FINDINGS: There are no findings suspicious for malignancy. Images were processed with CAD. IMPRESSION: No mammographic evidence of malignancy. A result letter of this screening mammogram will be mailed directly to the patient. RECOMMENDATION: Screening mammogram in one year. (Code:SM-B-01Y) BI-RADS CATEGORY  1: Negative. Electronically Signed   By: Abelardo Diesel M.D.   On: 04/18/2014 14:16    Assessment & Plan:   Diagnoses and all orders for this visit:  Hypothyroidism due to acquired atrophy of thyroid -     oxyCODONE (OXYCONTIN) 40 mg 12 hr tablet; Take 1 tablet (40 mg total) by mouth every 6 (six) hours. Please fill on or after 06/23/16 -     oxyCODONE (OXYCONTIN) 80 mg 12 hr tablet; Take 1 tablet (80 mg total) by mouth every 6 (six) hours. Please fill on or after 06/23/16  Other orders -     potassium chloride SA (KLOR-CON M20) 20 MEQ tablet; Take 1 tablet (20 mEq total) by mouth daily.   I am having Ms. Carruthers maintain her aspirin, COENZYME Q-10 PO, triamcinolone, potassium chloride SA, levothyroxine, albuterol, Cholecalciferol, oxyCODONE, oxyCODONE, and furosemide.  No orders of the defined types were placed in this encounter.    Follow-up: No Follow-up on file.  Walker Kehr, MD

## 2016-07-22 NOTE — Assessment & Plan Note (Signed)

## 2016-07-22 NOTE — Progress Notes (Signed)
Pre visit review using our clinic review tool, if applicable. No additional management support is needed unless otherwise documented below in the visit note. 

## 2016-07-22 NOTE — Patient Instructions (Signed)
Preventive Care for Adults, Female A healthy lifestyle and preventive care can promote health and wellness. Preventive health guidelines for women include the following key practices.  A routine yearly physical is a good way to check with your health care provider about your health and preventive screening. It is a chance to share any concerns and updates on your health and to receive a thorough exam.  Visit your dentist for a routine exam and preventive care every 6 months. Brush your teeth twice a day and floss once a day. Good oral hygiene prevents tooth decay and gum disease.  The frequency of eye exams is based on your age, health, family medical history, use of contact lenses, and other factors. Follow your health care provider's recommendations for frequency of eye exams.  Eat a healthy diet. Foods like vegetables, fruits, whole grains, low-fat dairy products, and lean protein foods contain the nutrients you need without too many calories. Decrease your intake of foods high in solid fats, added sugars, and salt. Eat the right amount of calories for you.Get information about a proper diet from your health care provider, if necessary.  Regular physical exercise is one of the most important things you can do for your health. Most adults should get at least 150 minutes of moderate-intensity exercise (any activity that increases your heart rate and causes you to sweat) each week. In addition, most adults need muscle-strengthening exercises on 2 or more days a week.  Maintain a healthy weight. The body mass index (BMI) is a screening tool to identify possible weight problems. It provides an estimate of body fat based on height and weight. Your health care provider can find your BMI and can help you achieve or maintain a healthy weight.For adults 20 years and older:  A BMI below 18.5 is considered underweight.  A BMI of 18.5 to 24.9 is normal.  A BMI of 25 to 29.9 is considered overweight.  A  BMI of 30 and above is considered obese.  Maintain normal blood lipids and cholesterol levels by exercising and minimizing your intake of saturated fat. Eat a balanced diet with plenty of fruit and vegetables. Blood tests for lipids and cholesterol should begin at age 45 and be repeated every 5 years. If your lipid or cholesterol levels are high, you are over 50, or you are at high risk for heart disease, you may need your cholesterol levels checked more frequently.Ongoing high lipid and cholesterol levels should be treated with medicines if diet and exercise are not working.  If you smoke, find out from your health care provider how to quit. If you do not use tobacco, do not start.  Lung cancer screening is recommended for adults aged 45-80 years who are at high risk for developing lung cancer because of a history of smoking. A yearly low-dose CT scan of the lungs is recommended for people who have at least a 30-pack-year history of smoking and are a current smoker or have quit within the past 15 years. A pack year of smoking is smoking an average of 1 pack of cigarettes a day for 1 year (for example: 1 pack a day for 30 years or 2 packs a day for 15 years). Yearly screening should continue until the smoker has stopped smoking for at least 15 years. Yearly screening should be stopped for people who develop a health problem that would prevent them from having lung cancer treatment.  If you are pregnant, do not drink alcohol. If you are  breastfeeding, be very cautious about drinking alcohol. If you are not pregnant and choose to drink alcohol, do not have more than 1 drink per day. One drink is considered to be 12 ounces (355 mL) of beer, 5 ounces (148 mL) of wine, or 1.5 ounces (44 mL) of liquor.  Avoid use of street drugs. Do not share needles with anyone. Ask for help if you need support or instructions about stopping the use of drugs.  High blood pressure causes heart disease and increases the risk  of stroke. Your blood pressure should be checked at least every 1 to 2 years. Ongoing high blood pressure should be treated with medicines if weight loss and exercise do not work.  If you are 55-79 years old, ask your health care provider if you should take aspirin to prevent strokes.  Diabetes screening is done by taking a blood sample to check your blood glucose level after you have not eaten for a certain period of time (fasting). If you are not overweight and you do not have risk factors for diabetes, you should be screened once every 3 years starting at age 45. If you are overweight or obese and you are 40-70 years of age, you should be screened for diabetes every year as part of your cardiovascular risk assessment.  Breast cancer screening is essential preventive care for women. You should practice "breast self-awareness." This means understanding the normal appearance and feel of your breasts and may include breast self-examination. Any changes detected, no matter how small, should be reported to a health care provider. Women in their 20s and 30s should have a clinical breast exam (CBE) by a health care provider as part of a regular health exam every 1 to 3 years. After age 40, women should have a CBE every year. Starting at age 40, women should consider having a mammogram (breast X-ray test) every year. Women who have a family history of breast cancer should talk to their health care provider about genetic screening. Women at a high risk of breast cancer should talk to their health care providers about having an MRI and a mammogram every year.  Breast cancer gene (BRCA)-related cancer risk assessment is recommended for women who have family members with BRCA-related cancers. BRCA-related cancers include breast, ovarian, tubal, and peritoneal cancers. Having family members with these cancers may be associated with an increased risk for harmful changes (mutations) in the breast cancer genes BRCA1 and  BRCA2. Results of the assessment will determine the need for genetic counseling and BRCA1 and BRCA2 testing.  Your health care provider may recommend that you be screened regularly for cancer of the pelvic organs (ovaries, uterus, and vagina). This screening involves a pelvic examination, including checking for microscopic changes to the surface of your cervix (Pap test). You may be encouraged to have this screening done every 3 years, beginning at age 21.  For women ages 30-65, health care providers may recommend pelvic exams and Pap testing every 3 years, or they may recommend the Pap and pelvic exam, combined with testing for human papilloma virus (HPV), every 5 years. Some types of HPV increase your risk of cervical cancer. Testing for HPV may also be done on women of any age with unclear Pap test results.  Other health care providers may not recommend any screening for nonpregnant women who are considered low risk for pelvic cancer and who do not have symptoms. Ask your health care provider if a screening pelvic exam is right for   you.  If you have had past treatment for cervical cancer or a condition that could lead to cancer, you need Pap tests and screening for cancer for at least 20 years after your treatment. If Pap tests have been discontinued, your risk factors (such as having a new sexual partner) need to be reassessed to determine if screening should resume. Some women have medical problems that increase the chance of getting cervical cancer. In these cases, your health care provider may recommend more frequent screening and Pap tests.  Colorectal cancer can be detected and often prevented. Most routine colorectal cancer screening begins at the age of 50 years and continues through age 75 years. However, your health care provider may recommend screening at an earlier age if you have risk factors for colon cancer. On a yearly basis, your health care provider may provide home test kits to check  for hidden blood in the stool. Use of a small camera at the end of a tube, to directly examine the colon (sigmoidoscopy or colonoscopy), can detect the earliest forms of colorectal cancer. Talk to your health care provider about this at age 50, when routine screening begins. Direct exam of the colon should be repeated every 5-10 years through age 75 years, unless early forms of precancerous polyps or small growths are found.  People who are at an increased risk for hepatitis B should be screened for this virus. You are considered at high risk for hepatitis B if:  You were born in a country where hepatitis B occurs often. Talk with your health care provider about which countries are considered high risk.  Your parents were born in a high-risk country and you have not received a shot to protect against hepatitis B (hepatitis B vaccine).  You have HIV or AIDS.  You use needles to inject street drugs.  You live with, or have sex with, someone who has hepatitis B.  You get hemodialysis treatment.  You take certain medicines for conditions like cancer, organ transplantation, and autoimmune conditions.  Hepatitis C blood testing is recommended for all people born from 1945 through 1965 and any individual with known risks for hepatitis C.  Practice safe sex. Use condoms and avoid high-risk sexual practices to reduce the spread of sexually transmitted infections (STIs). STIs include gonorrhea, chlamydia, syphilis, trichomonas, herpes, HPV, and human immunodeficiency virus (HIV). Herpes, HIV, and HPV are viral illnesses that have no cure. They can result in disability, cancer, and death.  You should be screened for sexually transmitted illnesses (STIs) including gonorrhea and chlamydia if:  You are sexually active and are younger than 24 years.  You are older than 24 years and your health care provider tells you that you are at risk for this type of infection.  Your sexual activity has changed  since you were last screened and you are at an increased risk for chlamydia or gonorrhea. Ask your health care provider if you are at risk.  If you are at risk of being infected with HIV, it is recommended that you take a prescription medicine daily to prevent HIV infection. This is called preexposure prophylaxis (PrEP). You are considered at risk if:  You are sexually active and do not regularly use condoms or know the HIV status of your partner(s).  You take drugs by injection.  You are sexually active with a partner who has HIV.  Talk with your health care provider about whether you are at high risk of being infected with HIV. If   you choose to begin PrEP, you should first be tested for HIV. You should then be tested every 3 months for as long as you are taking PrEP.  Osteoporosis is a disease in which the bones lose minerals and strength with aging. This can result in serious bone fractures or breaks. The risk of osteoporosis can be identified using a bone density scan. Women ages 67 years and over and women at risk for fractures or osteoporosis should discuss screening with their health care providers. Ask your health care provider whether you should take a calcium supplement or vitamin D to reduce the rate of osteoporosis.  Menopause can be associated with physical symptoms and risks. Hormone replacement therapy is available to decrease symptoms and risks. You should talk to your health care provider about whether hormone replacement therapy is right for you.  Use sunscreen. Apply sunscreen liberally and repeatedly throughout the day. You should seek shade when your shadow is shorter than you. Protect yourself by wearing long sleeves, pants, a wide-brimmed hat, and sunglasses year round, whenever you are outdoors.  Once a month, do a whole body skin exam, using a mirror to look at the skin on your back. Tell your health care provider of new moles, moles that have irregular borders, moles that  are larger than a pencil eraser, or moles that have changed in shape or color.  Stay current with required vaccines (immunizations).  Influenza vaccine. All adults should be immunized every year.  Tetanus, diphtheria, and acellular pertussis (Td, Tdap) vaccine. Pregnant women should receive 1 dose of Tdap vaccine during each pregnancy. The dose should be obtained regardless of the length of time since the last dose. Immunization is preferred during the 27th-36th week of gestation. An adult who has not previously received Tdap or who does not know her vaccine status should receive 1 dose of Tdap. This initial dose should be followed by tetanus and diphtheria toxoids (Td) booster doses every 10 years. Adults with an unknown or incomplete history of completing a 3-dose immunization series with Td-containing vaccines should begin or complete a primary immunization series including a Tdap dose. Adults should receive a Td booster every 10 years.  Varicella vaccine. An adult without evidence of immunity to varicella should receive 2 doses or a second dose if she has previously received 1 dose. Pregnant females who do not have evidence of immunity should receive the first dose after pregnancy. This first dose should be obtained before leaving the health care facility. The second dose should be obtained 4-8 weeks after the first dose.  Human papillomavirus (HPV) vaccine. Females aged 13-26 years who have not received the vaccine previously should obtain the 3-dose series. The vaccine is not recommended for use in pregnant females. However, pregnancy testing is not needed before receiving a dose. If a female is found to be pregnant after receiving a dose, no treatment is needed. In that case, the remaining doses should be delayed until after the pregnancy. Immunization is recommended for any person with an immunocompromised condition through the age of 61 years if she did not get any or all doses earlier. During the  3-dose series, the second dose should be obtained 4-8 weeks after the first dose. The third dose should be obtained 24 weeks after the first dose and 16 weeks after the second dose.  Zoster vaccine. One dose is recommended for adults aged 30 years or older unless certain conditions are present.  Measles, mumps, and rubella (MMR) vaccine. Adults born  before 1957 generally are considered immune to measles and mumps. Adults born in 1957 or later should have 1 or more doses of MMR vaccine unless there is a contraindication to the vaccine or there is laboratory evidence of immunity to each of the three diseases. A routine second dose of MMR vaccine should be obtained at least 28 days after the first dose for students attending postsecondary schools, health care workers, or international travelers. People who received inactivated measles vaccine or an unknown type of measles vaccine during 1963-1967 should receive 2 doses of MMR vaccine. People who received inactivated mumps vaccine or an unknown type of mumps vaccine before 1979 and are at high risk for mumps infection should consider immunization with 2 doses of MMR vaccine. For females of childbearing age, rubella immunity should be determined. If there is no evidence of immunity, females who are not pregnant should be vaccinated. If there is no evidence of immunity, females who are pregnant should delay immunization until after pregnancy. Unvaccinated health care workers born before 1957 who lack laboratory evidence of measles, mumps, or rubella immunity or laboratory confirmation of disease should consider measles and mumps immunization with 2 doses of MMR vaccine or rubella immunization with 1 dose of MMR vaccine.  Pneumococcal 13-valent conjugate (PCV13) vaccine. When indicated, a person who is uncertain of his immunization history and has no record of immunization should receive the PCV13 vaccine. All adults 65 years of age and older should receive this  vaccine. An adult aged 19 years or older who has certain medical conditions and has not been previously immunized should receive 1 dose of PCV13 vaccine. This PCV13 should be followed with a dose of pneumococcal polysaccharide (PPSV23) vaccine. Adults who are at high risk for pneumococcal disease should obtain the PPSV23 vaccine at least 8 weeks after the dose of PCV13 vaccine. Adults older than 56 years of age who have normal immune system function should obtain the PPSV23 vaccine dose at least 1 year after the dose of PCV13 vaccine.  Pneumococcal polysaccharide (PPSV23) vaccine. When PCV13 is also indicated, PCV13 should be obtained first. All adults aged 65 years and older should be immunized. An adult younger than age 65 years who has certain medical conditions should be immunized. Any person who resides in a nursing home or long-term care facility should be immunized. An adult smoker should be immunized. People with an immunocompromised condition and certain other conditions should receive both PCV13 and PPSV23 vaccines. People with human immunodeficiency virus (HIV) infection should be immunized as soon as possible after diagnosis. Immunization during chemotherapy or radiation therapy should be avoided. Routine use of PPSV23 vaccine is not recommended for American Indians, [place] Natives, or people younger than 65 years unless there are medical conditions that require PPSV23 vaccine. When indicated, people who have unknown immunization and have no record of immunization should receive PPSV23 vaccine. One-time revaccination 5 years after the first dose of PPSV23 is recommended for people aged 19-64 years who have chronic kidney failure, nephrotic syndrome, asplenia, or immunocompromised conditions. People who received 1-2 doses of PPSV23 before age 65 years should receive another dose of PPSV23 vaccine at age 65 years or later if at least 5 years have passed since the previous dose. Doses of PPSV23 are not  needed for people immunized with PPSV23 at or after age 65 years.  Meningococcal vaccine. Adults with asplenia or persistent complement component deficiencies should receive 2 doses of quadrivalent meningococcal conjugate (MenACWY-D) vaccine. The doses should be obtained   at least 2 months apart. Microbiologists working with certain meningococcal bacteria, [place] recruits, people at risk during an outbreak, and people who travel to or live in countries with a high rate of meningitis should be immunized. A first-year college student up through age 34 years who is living in a residence hall should receive a dose if she did not receive a dose on or after her 16th birthday. Adults who have certain high-risk conditions should receive one or more doses of vaccine.  Hepatitis A vaccine. Adults who wish to be protected from this disease, have certain high-risk conditions, work with hepatitis A-infected animals, work in hepatitis A research labs, or travel to or work in countries with a high rate of hepatitis A should be immunized. Adults who were previously unvaccinated and who anticipate close contact with an international adoptee during the first 60 days after arrival in the [place] from a country with a high rate of hepatitis A should be immunized.  Hepatitis B vaccine. Adults who wish to be protected from this disease, have certain high-risk conditions, may be exposed to blood or other infectious body fluids, are household contacts or sex partners of hepatitis B positive people, are clients or workers in certain care facilities, or travel to or work in countries with a high rate of hepatitis B should be immunized.  Haemophilus influenzae type b (Hib) vaccine. A previously unvaccinated person with asplenia or sickle cell disease or having a scheduled splenectomy should receive 1 dose of Hib vaccine. Regardless of previous immunization, a recipient of a hematopoietic stem cell transplant should receive a  3-dose series 6-12 months after her successful transplant. Hib vaccine is not recommended for adults with HIV infection. Preventive Services / Frequency Ages 35 to 4 years  Blood pressure check.** / Every 3-5 years.  Lipid and cholesterol check.** / Every 5 years beginning at age 60.  Clinical breast exam.** / Every 3 years for women in their 71s and 10s.  BRCA-related cancer risk assessment.** / For women who have family members with a BRCA-related cancer (breast, ovarian, tubal, or peritoneal cancers).  Pap test.** / Every 2 years from ages 76 through 26. Every 3 years starting at age 61 through age 76 or 93 with a history of 3 consecutive normal Pap tests.  HPV screening.** / Every 3 years from ages 37 through ages 60 to 51 with a history of 3 consecutive normal Pap tests.  Hepatitis C blood test.** / For any individual with known risks for hepatitis C.  Skin self-exam. / Monthly.  Influenza vaccine. / Every year.  Tetanus, diphtheria, and acellular pertussis (Tdap, Td) vaccine.** / Consult your health care provider. Pregnant women should receive 1 dose of Tdap vaccine during each pregnancy. 1 dose of Td every 10 years.  Varicella vaccine.** / Consult your health care provider. Pregnant females who do not have evidence of immunity should receive the first dose after pregnancy.  HPV vaccine. / 3 doses over 6 months, if 93 and younger. The vaccine is not recommended for use in pregnant females. However, pregnancy testing is not needed before receiving a dose.  Measles, mumps, rubella (MMR) vaccine.** / You need at least 1 dose of MMR if you were born in 1957 or later. You may also need a 2nd dose. For females of childbearing age, rubella immunity should be determined. If there is no evidence of immunity, females who are not pregnant should be vaccinated. If there is no evidence of immunity, females who are  pregnant should delay immunization until after pregnancy.  Pneumococcal  13-valent conjugate (PCV13) vaccine.** / Consult your health care provider.  Pneumococcal polysaccharide (PPSV23) vaccine.** / 1 to 2 doses if you smoke cigarettes or if you have certain conditions.  Meningococcal vaccine.** / 1 dose if you are age 68 to 8 years and a Market researcher living in a residence hall, or have one of several medical conditions, you need to get vaccinated against meningococcal disease. You may also need additional booster doses.  Hepatitis A vaccine.** / Consult your health care provider.  Hepatitis B vaccine.** / Consult your health care provider.  Haemophilus influenzae type b (Hib) vaccine.** / Consult your health care provider. Ages 7 to 53 years  Blood pressure check.** / Every year.  Lipid and cholesterol check.** / Every 5 years beginning at age 25 years.  Lung cancer screening. / Every year if you are aged 11-80 years and have a 30-pack-year history of smoking and currently smoke or have quit within the past 15 years. Yearly screening is stopped once you have quit smoking for at least 15 years or develop a health problem that would prevent you from having lung cancer treatment.  Clinical breast exam.** / Every year after age 48 years.  BRCA-related cancer risk assessment.** / For women who have family members with a BRCA-related cancer (breast, ovarian, tubal, or peritoneal cancers).  Mammogram.** / Every year beginning at age 41 years and continuing for as long as you are in good health. Consult with your health care provider.  Pap test.** / Every 3 years starting at age 65 years through age 37 or 70 years with a history of 3 consecutive normal Pap tests.  HPV screening.** / Every 3 years from ages 72 years through ages 60 to 40 years with a history of 3 consecutive normal Pap tests.  Fecal occult blood test (FOBT) of stool. / Every year beginning at age 21 years and continuing until age 5 years. You may not need to do this test if you get  a colonoscopy every 10 years.  Flexible sigmoidoscopy or colonoscopy.** / Every 5 years for a flexible sigmoidoscopy or every 10 years for a colonoscopy beginning at age 35 years and continuing until age 48 years.  Hepatitis C blood test.** / For all people born from [birth year] through 1965 and any individual with known risks for hepatitis C.  Skin self-exam. / Monthly.  Influenza vaccine. / Every year.  Tetanus, diphtheria, and acellular pertussis (Tdap/Td) vaccine.** / Consult your health care provider. Pregnant women should receive 1 dose of Tdap vaccine during each pregnancy. 1 dose of Td every 10 years.  Varicella vaccine.** / Consult your health care provider. Pregnant females who do not have evidence of immunity should receive the first dose after pregnancy.  Zoster vaccine.** / 1 dose for adults aged 30 years or older.  Measles, mumps, rubella (MMR) vaccine.** / You need at least 1 dose of MMR if you were born in 1957 or later. You may also need a second dose. For females of childbearing age, rubella immunity should be determined. If there is no evidence of immunity, females who are not pregnant should be vaccinated. If there is no evidence of immunity, females who are pregnant should delay immunization until after pregnancy.  Pneumococcal 13-valent conjugate (PCV13) vaccine.** / Consult your health care provider.  Pneumococcal polysaccharide (PPSV23) vaccine.** / 1 to 2 doses if you smoke cigarettes or if you have certain conditions.  Meningococcal vaccine.** /  Consult your health care provider.  Hepatitis A vaccine.** / Consult your health care provider.  Hepatitis B vaccine.** / Consult your health care provider.  Haemophilus influenzae type b (Hib) vaccine.** / Consult your health care provider. Ages 64 years and over  Blood pressure check.** / Every year.  Lipid and cholesterol check.** / Every 5 years beginning at age 23 years.  Lung cancer screening. / Every year if you  are aged 16-80 years and have a 30-pack-year history of smoking and currently smoke or have quit within the past 15 years. Yearly screening is stopped once you have quit smoking for at least 15 years or develop a health problem that would prevent you from having lung cancer treatment.  Clinical breast exam.** / Every year after age 74 years.  BRCA-related cancer risk assessment.** / For women who have family members with a BRCA-related cancer (breast, ovarian, tubal, or peritoneal cancers).  Mammogram.** / Every year beginning at age 44 years and continuing for as long as you are in good health. Consult with your health care provider.  Pap test.** / Every 3 years starting at age 58 years through age 22 or 39 years with 3 consecutive normal Pap tests. Testing can be stopped between 65 and 70 years with 3 consecutive normal Pap tests and no abnormal Pap or HPV tests in the past 10 years.  HPV screening.** / Every 3 years from ages 64 years through ages 70 or 61 years with a history of 3 consecutive normal Pap tests. Testing can be stopped between 65 and 70 years with 3 consecutive normal Pap tests and no abnormal Pap or HPV tests in the past 10 years.  Fecal occult blood test (FOBT) of stool. / Every year beginning at age 40 years and continuing until age 27 years. You may not need to do this test if you get a colonoscopy every 10 years.  Flexible sigmoidoscopy or colonoscopy.** / Every 5 years for a flexible sigmoidoscopy or every 10 years for a colonoscopy beginning at age 7 years and continuing until age 32 years.  Hepatitis C blood test.** / For all people born from [birth year] through 1966 and any individual with known risks for hepatitis C.  Osteoporosis screening.** / A one-time screening for women ages 30 years and over and women at risk for fractures or osteoporosis.  Skin self-exam. / Monthly.  Influenza vaccine. / Every year.  Tetanus, diphtheria, and acellular pertussis (Tdap/Td)  vaccine.** / 1 dose of Td every 10 years.  Varicella vaccine.** / Consult your health care provider.  Zoster vaccine.** / 1 dose for adults aged 35 years or older.  Pneumococcal 13-valent conjugate (PCV13) vaccine.** / Consult your health care provider.  Pneumococcal polysaccharide (PPSV23) vaccine.** / 1 dose for all adults aged 46 years and older.  Meningococcal vaccine.** / Consult your health care provider.  Hepatitis A vaccine.** / Consult your health care provider.  Hepatitis B vaccine.** / Consult your health care provider.  Haemophilus influenzae type b (Hib) vaccine.** / Consult your health care provider. ** Family history and personal history of risk and conditions may change your health care provider's recommendations.   This information is not intended to replace advice given to you by your health care provider. Make sure you discuss any questions you have with your health care provider.   Document Released: 10/28/2001 Document Revised: 09/22/2014 Document Reviewed: 01/27/2011 Elsevier Interactive Patient Education [employer].

## 2016-07-24 LAB — HEPATITIS C RNA QUANTITATIVE: HCV Quantitative: NOT DETECTED IU/mL (ref <15)

## 2016-07-25 ENCOUNTER — Telehealth: Payer: Self-pay | Admitting: Internal Medicine

## 2016-07-25 NOTE — Telephone Encounter (Signed)
Patient called back.  Gave Hep C results.

## 2016-10-20 ENCOUNTER — Other Ambulatory Visit: Payer: Self-pay | Admitting: Internal Medicine

## 2016-10-21 ENCOUNTER — Encounter: Payer: Self-pay | Admitting: Internal Medicine

## 2016-10-21 ENCOUNTER — Ambulatory Visit (INDEPENDENT_AMBULATORY_CARE_PROVIDER_SITE_OTHER): Payer: Medicare Other | Admitting: Internal Medicine

## 2016-10-21 DIAGNOSIS — R031 Nonspecific low blood-pressure reading: Secondary | ICD-10-CM | POA: Diagnosis not present

## 2016-10-21 DIAGNOSIS — E890 Postprocedural hypothyroidism: Secondary | ICD-10-CM

## 2016-10-21 DIAGNOSIS — M5440 Lumbago with sciatica, unspecified side: Secondary | ICD-10-CM | POA: Diagnosis not present

## 2016-10-21 DIAGNOSIS — E042 Nontoxic multinodular goiter: Secondary | ICD-10-CM | POA: Diagnosis not present

## 2016-10-21 DIAGNOSIS — E034 Atrophy of thyroid (acquired): Secondary | ICD-10-CM

## 2016-10-21 MED ORDER — FLUTICASONE PROPIONATE 50 MCG/ACT NA SUSP
2.0000 | Freq: Every day | NASAL | 11 refills | Status: DC
Start: 2016-10-21 — End: 2020-02-15

## 2016-10-21 MED ORDER — OXYCODONE HCL ER 40 MG PO T12A: 40.0000 mg | EXTENDED_RELEASE_TABLET | Freq: Four times a day (QID) | ORAL | 0 refills | Status: DC

## 2016-10-21 MED ORDER — OXYCODONE HCL ER 80 MG PO T12A: 80.0000 mg | EXTENDED_RELEASE_TABLET | Freq: Four times a day (QID) | ORAL | 0 refills | Status: DC

## 2016-10-21 NOTE — Assessment & Plan Note (Signed)
Oxycod/Oxycont  Potential benefits of a long term opioids use as well as potential risks (i.e. addiction risk, apnea etc) and complications (i.e. Somnolence, constipation and others) were explained to the patient and were aknowledged.

## 2016-10-21 NOTE — Progress Notes (Signed)
Subjective:  Patient ID: Abigail Wilson, female    DOB: October 22, 1960  Age: 56 y.o. MRN: [id number]  CC: No chief complaint on file.   HPI ADREE MCCALEB presents for a LBP, hypothyroidism f/u. C/o URI sx's - better.  Outpatient Medications Prior to Visit  Medication Sig Dispense Refill  . albuterol (PROVENTIL HFA;VENTOLIN HFA) 108 (90 Base) MCG/ACT inhaler Inhale 1-2 puffs into the lungs every 6 (six) hours as needed for wheezing or shortness of breath. 8.5 each 1  . aspirin 81 MG EC tablet Take 81 mg by mouth daily.      . Cholecalciferol 3000 units TABS Take 2 tablets by mouth daily.    [name] COENZYME Q-10 PO Take 200 mg by mouth daily.     . furosemide (LASIX) 40 MG tablet TAKE 1 TABLET (40 MG TOTAL) BY MOUTH DAILY. 90 tablet 3  . levothyroxine (SYNTHROID, LEVOTHROID) 25 MCG tablet TAKE 2 TABLETS (50 MCG TOTAL) BY MOUTH DAILY BEFORE BREAKFAST. 60 tablet 5  . oxyCODONE (OXYCONTIN) 40 mg 12 hr tablet Take 1 tablet (40 mg total) by mouth every 6 (six) hours. Please fill on or after 09/23/16 120 tablet 0  . oxyCODONE (OXYCONTIN) 80 mg 12 hr tablet Take 1 tablet (80 mg total) by mouth every 6 (six) hours. Please fill on or after 09/23/16 120 tablet 0  . potassium chloride SA (KLOR-CON M20) 20 MEQ tablet Take 1 tablet (20 mEq total) by mouth daily. 90 tablet 3  . triamcinolone (NASACORT) 55 MCG/ACT AERO nasal inhaler Place 2 sprays into the nose daily. 1 Inhaler 12   No facility-administered medications prior to visit.     ROS Review of Systems  Constitutional: Negative for activity change, appetite change, chills, fatigue and unexpected weight change.  HENT: Negative for congestion, mouth sores and sinus pressure.   Eyes: Negative for visual disturbance.  Respiratory: Negative for cough and chest tightness.   Gastrointestinal: Negative for abdominal pain and nausea.  Genitourinary: Negative for difficulty urinating, frequency and vaginal pain.  Musculoskeletal: Positive for back pain.  Negative for gait problem.  Skin: Negative for pallor and rash.  Neurological: Negative for dizziness, tremors, weakness, numbness and headaches.  Psychiatric/Behavioral: Negative for confusion and sleep disturbance.    Objective:  BP 130/74   Pulse 75   Temp 98.5 F (36.9 C) (Oral)   Resp 20   Wt 121 lb (54.9 kg)   SpO2 97%   BMI 20.14 kg/m   BP Readings from Last 3 Encounters:  10/21/16 130/74  07/22/16 110/68  04/22/16 120/80    Wt Readings from Last 3 Encounters:  10/21/16 121 lb (54.9 kg)  07/22/16 119 lb (54 kg)  04/22/16 118 lb (53.5 kg)    Physical Exam  Constitutional: She appears well-developed. No distress.  HENT:  Head: Normocephalic.  Right Ear: External ear normal.  Left Ear: External ear normal.  Nose: Nose normal.  Mouth/Throat: Oropharynx is clear and moist.  Eyes: Conjunctivae are normal. Pupils are equal, round, and reactive to light. Right eye exhibits no discharge. Left eye exhibits no discharge.  Neck: Normal range of motion. Neck supple. No JVD present. No tracheal deviation present. No thyromegaly present.  Cardiovascular: Normal rate, regular rhythm and normal heart sounds.   Pulmonary/Chest: No stridor. No respiratory distress. She has no wheezes.  Abdominal: Soft. Bowel sounds are normal. She exhibits no distension and no mass. There is no tenderness. There is no rebound and no guarding.  Musculoskeletal: She exhibits tenderness.  She exhibits no edema.  Lymphadenopathy:    She has no cervical adenopathy.  Neurological: She displays normal reflexes. No cranial nerve deficit. She exhibits normal muscle tone. Coordination normal.  Skin: No rash noted. No erythema.  Psychiatric: She has a normal mood and affect. Her behavior is normal. Judgment and thought content normal.    Lab Results  Component Value Date   WBC 5.5 07/21/2016   HGB 13.6 07/21/2016   HCT 39.7 07/21/2016   PLT 241.0 07/21/2016   GLUCOSE 88 07/21/2016   CHOL 214 (H)  07/21/2016   TRIG 115.0 07/21/2016   HDL 107.40 07/21/2016   LDLCALC 83 07/21/2016   ALT 12 07/21/2016   AST 21 07/21/2016   NA 141 07/21/2016   K 4.6 07/21/2016   CL 103 07/21/2016   CREATININE 0.78 07/21/2016   BUN 18 07/21/2016   CO2 32 07/21/2016   TSH 5.25 (H) 07/21/2016    Mm Screening Breast Tomo Bilateral  Result Date: 04/18/2014 CLINICAL DATA:  Screening. EXAM: DIGITAL SCREENING BILATERAL MAMMOGRAM WITH 3D TOMO WITH CAD COMPARISON:  Previous exam(s). ACR Breast Density Category c: The breast tissue is heterogeneously dense, which may obscure small masses. FINDINGS: There are no findings suspicious for malignancy. Images were processed with CAD. IMPRESSION: No mammographic evidence of malignancy. A result letter of this screening mammogram will be mailed directly to the patient. RECOMMENDATION: Screening mammogram in one year. (Code:SM-B-01Y) BI-RADS CATEGORY  1: Negative. Electronically Signed   By: Abelardo Diesel M.D.   On: 04/18/2014 14:16    Assessment & Plan:   There are no diagnoses linked to this encounter. I am having Ms. Rhett maintain her aspirin, COENZYME Q-10 PO, triamcinolone, albuterol, Cholecalciferol, furosemide, potassium chloride SA, oxyCODONE, oxyCODONE, and levothyroxine.  No orders of the defined types were placed in this encounter.    Follow-up: No Follow-up on file.  Walker Kehr, MD

## 2016-10-21 NOTE — Progress Notes (Signed)
Pre visit review using our clinic review tool, if applicable. No additional management support is needed unless otherwise documented below in the visit note. 

## 2016-10-21 NOTE — Assessment & Plan Note (Signed)
Levothroid po 

## 2016-10-21 NOTE — Assessment & Plan Note (Signed)
OK BP at home

## 2016-10-21 NOTE — Assessment & Plan Note (Signed)
labs

## 2016-12-22 ENCOUNTER — Telehealth: Payer: Self-pay | Admitting: *Deleted

## 2016-12-22 NOTE — Telephone Encounter (Signed)
Added ICD-10 code to med pert [name] will have to attach when giving pt refill due to the two different pain med & pt size...Abigail Wilson

## 2016-12-22 NOTE — Telephone Encounter (Signed)
Abigail Wilson left msg on triage stating needing to get ICD-10 code for medications Oxycodone 40 mg & Oxycodone 80 mg. Per chart called pharmacy back w/ information below  Code: M54.5 Noted: 06/04/2007 Share w/ Pt: [x]  Change Dx Resolve      Overview Edited: Walker Kehr, MD 01/02/2015 Chronic Oxycodone/Oxycontin Rx - long term Potential benefits of a long term opioids use as well as potential risks (i.e. addiction risk, apnea etc) and complications (i.e. Somnolence, constipation and others) were explained to the patient and were aknowledged.

## 2017-01-20 ENCOUNTER — Encounter: Payer: Self-pay | Admitting: Internal Medicine

## 2017-01-20 ENCOUNTER — Ambulatory Visit (INDEPENDENT_AMBULATORY_CARE_PROVIDER_SITE_OTHER): Payer: Medicare Other | Admitting: Internal Medicine

## 2017-01-20 DIAGNOSIS — E034 Atrophy of thyroid (acquired): Secondary | ICD-10-CM

## 2017-01-20 MED ORDER — OXYCODONE HCL ER 40 MG PO T12A: 40.0000 mg | EXTENDED_RELEASE_TABLET | Freq: Four times a day (QID) | ORAL | 0 refills | Status: DC

## 2017-01-20 MED ORDER — OXYCODONE HCL ER 80 MG PO T12A: 80.0000 mg | EXTENDED_RELEASE_TABLET | Freq: Four times a day (QID) | ORAL | 0 refills | Status: DC

## 2017-01-20 MED ORDER — ZOSTER VAC RECOMB ADJUVANTED 50 MCG/0.5ML IM SUSR: 0.5000 mL | Freq: Once | INTRAMUSCULAR | 1 refills | Status: AC

## 2017-01-20 MED ORDER — FUROSEMIDE 40 MG PO TABS: 40.0000 mg | ORAL_TABLET | Freq: Every day | ORAL | 3 refills | Status: DC

## 2017-01-20 NOTE — Progress Notes (Signed)
Pre visit review using our clinic review tool, if applicable. No additional management support is needed unless otherwise documented below in the visit note. 

## 2017-01-20 NOTE — Progress Notes (Signed)
Subjective:  Patient ID: Abigail Wilson, female    DOB: 06-23-1961  Age: 56 y.o. MRN: [id number]  CC: No chief complaint on file.   HPI LIBRA GATZ presents for   Outpatient Medications Prior to Visit  Medication Sig Dispense Refill  . albuterol (PROVENTIL HFA;VENTOLIN HFA) 108 (90 Base) MCG/ACT inhaler Inhale 1-2 puffs into the lungs every 6 (six) hours as needed for wheezing or shortness of breath. 8.5 each 1  . aspirin 81 MG EC tablet Take 81 mg by mouth daily.      . Cholecalciferol 3000 units TABS Take 2 tablets by mouth daily.    [name] COENZYME Q-10 PO Take 200 mg by mouth daily.     . fluticasone (FLONASE) 50 MCG/ACT nasal spray Place 2 sprays into both nostrils daily. 16 g 11  . furosemide (LASIX) 40 MG tablet TAKE 1 TABLET (40 MG TOTAL) BY MOUTH DAILY. 90 tablet 3  . levothyroxine (SYNTHROID, LEVOTHROID) 25 MCG tablet TAKE 2 TABLETS (50 MCG TOTAL) BY MOUTH DAILY BEFORE BREAKFAST. 60 tablet 5  . oxyCODONE (OXYCONTIN) 40 mg 12 hr tablet Take 1 tablet (40 mg total) by mouth every 6 (six) hours. Please fill on or after 12/22/16 120 tablet 0  . oxyCODONE (OXYCONTIN) 80 mg 12 hr tablet Take 1 tablet (80 mg total) by mouth every 6 (six) hours. Please fill on or after 12/22/16 120 tablet 0  . potassium chloride SA (KLOR-CON M20) 20 MEQ tablet Take 1 tablet (20 mEq total) by mouth daily. 90 tablet 3   No facility-administered medications prior to visit.     ROS Review of Systems  Constitutional: Negative for activity change, appetite change, chills, fatigue and unexpected weight change.  HENT: Negative for congestion, mouth sores and sinus pressure.   Eyes: Negative for visual disturbance.  Respiratory: Negative for cough and chest tightness.   Gastrointestinal: Negative for abdominal pain and nausea.  Genitourinary: Negative for difficulty urinating, frequency and vaginal pain.  Musculoskeletal: Positive for back pain. Negative for gait problem.  Skin: Positive for rash.  Negative for pallor.  Neurological: Negative for dizziness, tremors, weakness, numbness and headaches.  Psychiatric/Behavioral: Negative for confusion, sleep disturbance and suicidal ideas.    Objective:  BP 116/64 (BP Location: Left Arm, Patient Position: Sitting, Cuff Size: Normal)   Pulse 82   Temp 98.6 F (37 C) (Oral)   Ht 5\' 5"  (1.651 m)   Wt 116 lb 0.6 oz (52.6 kg)   SpO2 99%   BMI 19.31 kg/m   BP Readings from Last 3 Encounters:  01/20/17 116/64  10/21/16 130/74  07/22/16 110/68    Wt Readings from Last 3 Encounters:  01/20/17 116 lb 0.6 oz (52.6 kg)  10/21/16 121 lb (54.9 kg)  07/22/16 119 lb (54 kg)    Physical Exam  Constitutional: She appears well-developed. No distress.  HENT:  Head: Normocephalic.  Right Ear: External ear normal.  Left Ear: External ear normal.  Nose: Nose normal.  Mouth/Throat: Oropharynx is clear and moist.  Eyes: Conjunctivae are normal. Pupils are equal, round, and reactive to light. Right eye exhibits no discharge. Left eye exhibits no discharge.  Neck: Normal range of motion. Neck supple. No JVD present. No tracheal deviation present. No thyromegaly present.  Cardiovascular: Normal rate, regular rhythm and normal heart sounds.   Pulmonary/Chest: No stridor. No respiratory distress. She has no wheezes.  Abdominal: Soft. Bowel sounds are normal. She exhibits no distension and no mass. There is no tenderness. There  is no rebound and no guarding.  Musculoskeletal: She exhibits tenderness. She exhibits no edema.  Lymphadenopathy:    She has no cervical adenopathy.  Neurological: She displays normal reflexes. No cranial nerve deficit. She exhibits normal muscle tone. Coordination normal.  Skin: Rash noted. No erythema.  Psychiatric: She has a normal mood and affect. Her behavior is normal. Judgment and thought content normal.  LS tender Rash on R forearm, and L shoulder  Lab Results  Component Value Date   WBC 5.5 07/21/2016   HGB  13.6 07/21/2016   HCT 39.7 07/21/2016   PLT 241.0 07/21/2016   GLUCOSE 88 07/21/2016   CHOL 214 (H) 07/21/2016   TRIG 115.0 07/21/2016   HDL 107.40 07/21/2016   LDLCALC 83 07/21/2016   ALT 12 07/21/2016   AST 21 07/21/2016   NA 141 07/21/2016   K 4.6 07/21/2016   CL 103 07/21/2016   CREATININE 0.78 07/21/2016   BUN 18 07/21/2016   CO2 32 07/21/2016   TSH 5.25 (H) 07/21/2016    Mm Screening Breast Tomo Bilateral  Result Date: 04/18/2014 CLINICAL DATA:  Screening. EXAM: DIGITAL SCREENING BILATERAL MAMMOGRAM WITH 3D TOMO WITH CAD COMPARISON:  Previous exam(s). ACR Breast Density Category c: The breast tissue is heterogeneously dense, which may obscure small masses. FINDINGS: There are no findings suspicious for malignancy. Images were processed with CAD. IMPRESSION: No mammographic evidence of malignancy. A result letter of this screening mammogram will be mailed directly to the patient. RECOMMENDATION: Screening mammogram in one year. (Code:SM-B-01Y) BI-RADS CATEGORY  1: Negative. Electronically Signed   By: Abelardo Diesel M.D.   On: 04/18/2014 14:16    Assessment & Plan:   Diagnoses and all orders for this visit:  Hypothyroidism due to acquired atrophy of thyroid -     oxyCODONE (OXYCONTIN) 80 mg 12 hr tablet; Take 1 tablet (80 mg total) by mouth every 6 (six) hours. Please fill on or after 12/22/16 -     oxyCODONE (OXYCONTIN) 40 mg 12 hr tablet; Take 1 tablet (40 mg total) by mouth every 6 (six) hours. Please fill on or after 12/22/16  Other orders -     furosemide (LASIX) 40 MG tablet; Take 1 tablet (40 mg total) by mouth daily.   I am having Ms. Eichhorn maintain her aspirin, COENZYME Q-10 PO, albuterol, Cholecalciferol, furosemide, potassium chloride SA, levothyroxine, fluticasone, oxyCODONE, and oxyCODONE.  No orders of the defined types were placed in this encounter.    Follow-up: No Follow-up on file.  Walker Kehr, MD

## 2017-01-22 ENCOUNTER — Encounter: Payer: Self-pay | Admitting: Gastroenterology

## 2017-01-28 ENCOUNTER — Encounter: Payer: Self-pay | Admitting: Gynecology

## 2017-03-13 ENCOUNTER — Ambulatory Visit (AMBULATORY_SURGERY_CENTER): Payer: Self-pay

## 2017-03-13 VITALS — Ht 65.5 in | Wt 118.2 lb

## 2017-03-13 DIAGNOSIS — Z860100 Personal history of colon polyps, unspecified: Secondary | ICD-10-CM

## 2017-03-13 MED ORDER — SUPREP BOWEL PREP KIT 17.5-3.13-1.6 GM/177ML PO SOLN: 1.0000 | Freq: Once | ORAL | 0 refills | Status: AC

## 2017-03-13 NOTE — Progress Notes (Signed)
No allergies to eggs or soy No past problems with anesthesia No diet meds No home oxygen  Declined emmi 

## 2017-03-27 ENCOUNTER — Ambulatory Visit (AMBULATORY_SURGERY_CENTER): Payer: Medicare Other | Admitting: Gastroenterology

## 2017-03-27 ENCOUNTER — Encounter: Payer: Self-pay | Admitting: Gastroenterology

## 2017-03-27 VITALS — BP 108/51 | HR 60 | Temp 98.8°F | Resp 19 | Ht 65.0 in | Wt 118.0 lb

## 2017-03-27 DIAGNOSIS — Z86010 Personal history of colonic polyps: Secondary | ICD-10-CM | POA: Diagnosis not present

## 2017-03-27 DIAGNOSIS — D120 Benign neoplasm of cecum: Secondary | ICD-10-CM

## 2017-03-27 DIAGNOSIS — D123 Benign neoplasm of transverse colon: Secondary | ICD-10-CM

## 2017-03-27 DIAGNOSIS — K635 Polyp of colon: Secondary | ICD-10-CM

## 2017-03-27 MED ORDER — SODIUM CHLORIDE 0.9 % IV SOLN: 500.0000 mL | INTRAVENOUS | Status: AC

## 2017-03-27 NOTE — Progress Notes (Signed)
Called to room to assist during endoscopic procedure.  Patient ID and intended procedure confirmed with present staff. Received instructions for my participation in the procedure from the performing physician.  

## 2017-03-27 NOTE — Patient Instructions (Signed)
Impression/Recommendations:  Polyp handout given to patient. Hemorrhoid handout given to patient.  Repeat colonoscopy in 5 years for surveillance.  YOU HAD AN ENDOSCOPIC PROCEDURE TODAY AT [facility]:   Refer to the procedure report that was given to you for any specific questions about what was found during the examination.  If the procedure report does not answer your questions, please call your gastroenterologist to clarify.  If you requested that your care partner not be given the details of your procedure findings, then the procedure report has been included in a sealed envelope for you to review at your convenience later.  YOU SHOULD EXPECT: Some feelings of bloating in the abdomen. Passage of more gas than usual.  Walking can help get rid of the air that was put into your GI tract during the procedure and reduce the bloating. If you had a lower endoscopy (such as a colonoscopy or flexible sigmoidoscopy) you may notice spotting of blood in your stool or on the toilet paper. If you underwent a bowel prep for your procedure, you may not have a normal bowel movement for a few days.  Please Note:  You might notice some irritation and congestion in your nose or some drainage.  This is from the oxygen used during your procedure.  There is no need for concern and it should clear up in a day or so.  SYMPTOMS TO REPORT IMMEDIATELY:   Following lower endoscopy (colonoscopy or flexible sigmoidoscopy):  Excessive amounts of blood in the stool  Significant tenderness or worsening of abdominal pains  Swelling of the abdomen that is new, acute  Fever of 100F or higher For urgent or emergent issues, a gastroenterologist can be reached at any hour by calling [phone].   DIET:  We do recommend a small meal at first, but then you may proceed to your regular diet.  Drink plenty of fluids but you should avoid alcoholic beverages for 24 hours.  ACTIVITY:  You should plan to take it  easy for the rest of today and you should NOT DRIVE or use heavy machinery until tomorrow (because of the sedation medicines used during the test).    FOLLOW UP: Our staff will call the number listed on your records the next business day following your procedure to check on you and address any questions or concerns that you may have regarding the information given to you following your procedure. If we do not reach you, we will leave a message.  However, if you are feeling well and you are not experiencing any problems, there is no need to return our call.  We will assume that you have returned to your regular daily activities without incident.  If any biopsies were taken you will be contacted by phone or by letter within the next 1-3 weeks.  Please call us at [phone] if you have not heard about the biopsies in 3 weeks.    SIGNATURES/CONFIDENTIALITY: You and/or your care partner have signed paperwork which will be entered into your electronic medical record.  These signatures attest to the fact that that the information above on your After Visit Summary has been reviewed and is understood.  Full responsibility of the confidentiality of this discharge information lies with you and/or your care-partner.

## 2017-03-27 NOTE — Progress Notes (Signed)
To PACU VSS Report to RN 

## 2017-03-27 NOTE — Op Note (Signed)
[facility] Patient Name: Abigail Wilson Procedure Date: 03/27/2017 1:28 PM MRN: [id number] Endoscopist: Ladene Artist , MD Age: 56 Referring MD:  Date of Birth: 16-May-1961 Gender: Female Account #: [id number] Procedure:                Colonoscopy Indications:              Surveillance: Personal history of adenomatous                            polyps on last colonoscopy > 5 years ago Medicines:                Monitored Anesthesia Care Procedure:                Pre-Anesthesia Assessment:                           - Prior to the procedure, a History and Physical                            was performed, and patient medications and                            allergies were reviewed. The patient's tolerance of                            previous anesthesia was also reviewed. The risks                            and benefits of the procedure and the sedation                            options and risks were discussed with the patient.                            All questions were answered, and informed consent                            was obtained. Prior Anticoagulants: The patient has                            taken no previous anticoagulant or antiplatelet                            agents. ASA Grade Assessment: II - A patient with                            mild systemic disease. After reviewing the risks                            and benefits, the patient was deemed in                            satisfactory condition to undergo the procedure.  After obtaining informed consent, the colonoscope                            was passed under direct vision. Throughout the                            procedure, the patient's blood pressure, pulse, and                            oxygen saturations were monitored continuously. The                            Colonoscope was introduced through the anus and                            advanced to the the  cecum, identified by                            appendiceal orifice and ileocecal valve. The                            ileocecal valve, appendiceal orifice, and rectum                            were photographed. The quality of the bowel                            preparation was adequate. The colonoscopy was                            performed without difficulty. The patient tolerated                            the procedure well. Scope In: 1:41:50 PM Scope Out: 1:59:46 PM Scope Withdrawal Time: 0 hours 14 minutes 38 seconds  Total Procedure Duration: 0 hours 17 minutes 56 seconds  Findings:                 The perianal and digital rectal examinations were                            normal.                           A 4 mm polyp was found in the ileocecal valve. The                            polyp was sessile. The polyp was removed with a                            cold biopsy forceps. Resection and retrieval were                            complete.  Two sessile polyps were found in the transverse                            colon. The polyps were 6 to 7 mm in size. These                            polyps were removed with a cold snare. Resection                            and retrieval were complete.                           A diffuse area of mild melanosis was found in the                            entire colon.                           Internal and external hemorrhoids were found during                            retroflexion. The hemorrhoids were small and Grade                            I (internal hemorrhoids that do not prolapse).                           The exam was otherwise without abnormality on                            direct and retroflexion views. Complications:            No immediate complications. Estimated blood loss:                            None. Estimated Blood Loss:     Estimated blood loss: none. Impression:               -  One 4 mm polyp at the ileocecal valve, removed                            with a cold biopsy forceps. Resected and retrieved.                           - Two 6 to 7 mm polyps in the transverse colon,                            removed with a cold snare. Resected and retrieved.                           - Melanosis in the colon.                           - Internal and external hemorrhoids.                           -  The examination was otherwise normal on direct                            and retroflexion views. Recommendation:           - Repeat colonoscopy in 5 years for surveillance.                           - Patient has a contact number available for                            emergencies. The signs and symptoms of potential                            delayed complications were discussed with the                            patient. Return to normal activities tomorrow.                            Written discharge instructions were provided to the                            patient.                           - Resume previous diet.                           - Continue present medications.                           - Await pathology results. Ladene Artist, MD 03/27/2017 2:02:58 PM This report has been signed electronically.

## 2017-03-30 ENCOUNTER — Telehealth: Payer: Self-pay

## 2017-03-30 NOTE — Telephone Encounter (Signed)
  Follow up Call-  Call back number 03/27/2017  Post procedure Call Back phone  # [phone]  Permission to leave phone message Yes  Some recent data might be hidden     Patient questions:  Do you have a fever, pain , or abdominal swelling? No. Pain Score  0 *  Have you tolerated food without any problems? Yes.    Have you been able to return to your normal activities? Yes.    Do you have any questions about your discharge instructions: Diet   No. Medications  No. Follow up visit  No.  Do you have questions or concerns about your Care? No.  Actions: * If pain score is 4 or above: No action needed, pain <4.

## 2017-04-06 ENCOUNTER — Encounter: Payer: Self-pay | Admitting: Gastroenterology

## 2017-04-15 ENCOUNTER — Other Ambulatory Visit: Payer: Self-pay | Admitting: Internal Medicine

## 2017-04-22 ENCOUNTER — Ambulatory Visit (INDEPENDENT_AMBULATORY_CARE_PROVIDER_SITE_OTHER): Payer: Medicare Other | Admitting: Internal Medicine

## 2017-04-22 ENCOUNTER — Encounter: Payer: Self-pay | Admitting: Internal Medicine

## 2017-04-22 DIAGNOSIS — E890 Postprocedural hypothyroidism: Secondary | ICD-10-CM | POA: Diagnosis not present

## 2017-04-22 DIAGNOSIS — M5440 Lumbago with sciatica, unspecified side: Secondary | ICD-10-CM

## 2017-04-22 DIAGNOSIS — R634 Abnormal weight loss: Secondary | ICD-10-CM | POA: Diagnosis not present

## 2017-04-22 DIAGNOSIS — R600 Localized edema: Secondary | ICD-10-CM

## 2017-04-22 DIAGNOSIS — R609 Edema, unspecified: Secondary | ICD-10-CM | POA: Insufficient documentation

## 2017-04-22 DIAGNOSIS — E034 Atrophy of thyroid (acquired): Secondary | ICD-10-CM | POA: Diagnosis not present

## 2017-04-22 DIAGNOSIS — G8929 Other chronic pain: Secondary | ICD-10-CM | POA: Diagnosis not present

## 2017-04-22 MED ORDER — OXYCODONE HCL ER 80 MG PO T12A: 80.0000 mg | EXTENDED_RELEASE_TABLET | Freq: Four times a day (QID) | ORAL | 0 refills | Status: DC

## 2017-04-22 MED ORDER — FUROSEMIDE 40 MG PO TABS: 40.0000 mg | ORAL_TABLET | Freq: Every day | ORAL | 3 refills | Status: DC

## 2017-04-22 MED ORDER — LEVOTHYROXINE SODIUM 25 MCG PO TABS: 50.0000 ug | ORAL_TABLET | Freq: Every day | ORAL | 3 refills | Status: DC

## 2017-04-22 MED ORDER — OXYCODONE HCL ER 40 MG PO T12A: 40.0000 mg | EXTENDED_RELEASE_TABLET | Freq: Four times a day (QID) | ORAL | 0 refills | Status: DC

## 2017-04-22 NOTE — Assessment & Plan Note (Signed)
Wt Readings from Last 3 Encounters:  04/22/17 118 lb (53.5 kg)  03/27/17 118 lb (53.5 kg)  03/13/17 118 lb 3.2 oz (53.6 kg)

## 2017-04-22 NOTE — Assessment & Plan Note (Signed)
Levothroid 

## 2017-04-22 NOTE — Assessment & Plan Note (Signed)
Lasix, KCl Labs

## 2017-04-22 NOTE — Assessment & Plan Note (Signed)
Oxycodone/Oxycontin Rx - long term  Potential benefits of a long term opioids use as well as potential risks (i.e. addiction risk, apnea etc) and complications (i.e. Somnolence, constipation and others) were explained to the patient and were aknowledged. 

## 2017-05-06 ENCOUNTER — Other Ambulatory Visit (INDEPENDENT_AMBULATORY_CARE_PROVIDER_SITE_OTHER): Payer: Medicare Other

## 2017-05-06 ENCOUNTER — Telehealth: Payer: Self-pay | Admitting: Internal Medicine

## 2017-05-06 DIAGNOSIS — R600 Localized edema: Secondary | ICD-10-CM | POA: Diagnosis not present

## 2017-05-06 DIAGNOSIS — E034 Atrophy of thyroid (acquired): Secondary | ICD-10-CM

## 2017-05-06 DIAGNOSIS — E890 Postprocedural hypothyroidism: Secondary | ICD-10-CM | POA: Diagnosis not present

## 2017-05-06 LAB — BASIC METABOLIC PANEL
BUN: 17 mg/dL (ref 6–23)
CHLORIDE: 103 meq/L (ref 96–112)
CO2: 32 mEq/L (ref 19–32)
Calcium: 9.5 mg/dL (ref 8.4–10.5)
Creatinine, Ser: 0.78 mg/dL (ref 0.40–1.20)
GFR: 80.78 mL/min (ref >60.00)
Glucose, Bld: 93 mg/dL (ref 70–99)
POTASSIUM: 3.9 meq/L (ref 3.5–5.1)
SODIUM: 138 meq/L (ref 135–145)

## 2017-05-06 MED ORDER — OXYCODONE HCL ER 40 MG PO T12A: 40.0000 mg | EXTENDED_RELEASE_TABLET | Freq: Four times a day (QID) | ORAL | 0 refills | Status: DC

## 2017-05-06 MED ORDER — OXYCODONE HCL ER 80 MG PO T12A: 80.0000 mg | EXTENDED_RELEASE_TABLET | Freq: Four times a day (QID) | ORAL | 0 refills | Status: DC

## 2017-05-06 NOTE — Telephone Encounter (Signed)
Needs Rx for Oct

## 2017-05-07 LAB — TSH: TSH: 5.7 u[IU]/mL — AB (ref 0.35–4.50)

## 2017-05-13 ENCOUNTER — Other Ambulatory Visit: Payer: Self-pay | Admitting: Internal Medicine

## 2017-05-13 MED ORDER — LEVOTHYROXINE SODIUM 50 MCG PO TABS: 50.0000 ug | ORAL_TABLET | Freq: Every day | ORAL | 11 refills | Status: DC

## 2017-05-14 ENCOUNTER — Telehealth: Payer: Self-pay

## 2017-05-14 DIAGNOSIS — E890 Postprocedural hypothyroidism: Secondary | ICD-10-CM

## 2017-05-14 NOTE — Telephone Encounter (Signed)
Called pt with lab results and ordered a TSH

## 2017-05-16 ENCOUNTER — Other Ambulatory Visit: Payer: Self-pay | Admitting: Internal Medicine

## 2017-05-20 NOTE — Progress Notes (Signed)
Subjective:  Patient ID: Abigail Wilson, female    DOB: 1961/07/07  Age: 56 y.o. MRN: [id number]  CC: No chief complaint on file.   HPI Abigail Wilson presents for LBP, hypothyroidism, swelling f/u  Outpatient Medications Prior to Visit  Medication Sig Dispense Refill  . albuterol (PROVENTIL HFA;VENTOLIN HFA) 108 (90 Base) MCG/ACT inhaler Inhale 1-2 puffs into the lungs every 6 (six) hours as needed for wheezing or shortness of breath. 8.5 each 1  . aspirin 81 MG EC tablet Take 81 mg by mouth daily.      . Cholecalciferol 3000 units TABS Take 2 tablets by mouth daily.    [name] COENZYME Q-10 PO Take 200 mg by mouth daily.     . fluticasone (FLONASE) 50 MCG/ACT nasal spray Place 2 sprays into both nostrils daily. 16 g 11  . potassium chloride SA (KLOR-CON M20) 20 MEQ tablet Take 1 tablet (20 mEq total) by mouth daily. 90 tablet 3  . furosemide (LASIX) 40 MG tablet Take 1 tablet (40 mg total) by mouth daily. 90 tablet 3  . levothyroxine (SYNTHROID, LEVOTHROID) 25 MCG tablet TAKE 2 TABLETS (50 MCG TOTAL) BY MOUTH DAILY BEFORE BREAKFAST. 60 tablet 0  . oxyCODONE (OXYCONTIN) 40 mg 12 hr tablet Take 1 tablet (40 mg total) by mouth every 6 (six) hours. Please fill on or after 03/23/17 120 tablet 0  . oxyCODONE (OXYCONTIN) 80 mg 12 hr tablet Take 1 tablet (80 mg total) by mouth every 6 (six) hours. Please fill on or after 03/23/17 120 tablet 0   Facility-Administered Medications Prior to Visit  Medication Dose Route Frequency Provider Last Rate Last Dose  . 0.9 %  sodium chloride infusion  500 mL Intravenous Continuous Ladene Artist, MD        ROS Review of Systems  Constitutional: Negative for activity change, appetite change, chills, fatigue and unexpected weight change.  HENT: Negative for congestion, mouth sores and sinus pressure.   Eyes: Negative for visual disturbance.  Respiratory: Negative for cough and chest tightness.   Gastrointestinal: Negative for abdominal pain and nausea.    Genitourinary: Negative for difficulty urinating, frequency and vaginal pain.  Musculoskeletal: Positive for back pain. Negative for gait problem.  Skin: Negative for pallor and rash.  Neurological: Negative for dizziness, tremors, weakness, numbness and headaches.  Psychiatric/Behavioral: Negative for confusion and sleep disturbance.    Objective:  BP 112/64 (BP Location: Left Arm, Patient Position: Sitting, Cuff Size: Normal)   Pulse 73   Temp 98.9 F (37.2 C) (Oral)   Ht 5\' 5"  (1.651 m)   Wt 118 lb (53.5 kg)   SpO2 98%   BMI 19.64 kg/m   BP Readings from Last 3 Encounters:  04/22/17 112/64  03/27/17 (!) 108/51  01/20/17 116/64    Wt Readings from Last 3 Encounters:  04/22/17 118 lb (53.5 kg)  03/27/17 118 lb (53.5 kg)  03/13/17 118 lb 3.2 oz (53.6 kg)    Physical Exam  Constitutional: She appears well-developed. No distress.  HENT:  Head: Normocephalic.  Right Ear: External ear normal.  Left Ear: External ear normal.  Nose: Nose normal.  Mouth/Throat: Oropharynx is clear and moist.  Eyes: Pupils are equal, round, and reactive to light. Conjunctivae are normal. Right eye exhibits no discharge. Left eye exhibits no discharge.  Neck: Normal range of motion. Neck supple. No JVD present. No tracheal deviation present. No thyromegaly present.  Cardiovascular: Normal rate, regular rhythm and normal heart sounds.  Pulmonary/Chest: No stridor. No respiratory distress. She has no wheezes.  Abdominal: Soft. Bowel sounds are normal. She exhibits no distension and no mass. There is no tenderness. There is no rebound and no guarding.  Musculoskeletal: She exhibits tenderness. She exhibits no edema.  Lymphadenopathy:    She has no cervical adenopathy.  Neurological: She displays normal reflexes. No cranial nerve deficit. She exhibits normal muscle tone. Coordination normal.  Skin: No rash noted. No erythema.  Psychiatric: She has a normal mood and affect. Her behavior is  normal. Judgment and thought content normal.  LS tender  Lab Results  Component Value Date   WBC 5.5 07/21/2016   HGB 13.6 07/21/2016   HCT 39.7 07/21/2016   PLT 241.0 07/21/2016   GLUCOSE 93 05/06/2017   CHOL 214 (H) 07/21/2016   TRIG 115.0 07/21/2016   HDL 107.40 07/21/2016   LDLCALC 83 07/21/2016   ALT 12 07/21/2016   AST 21 07/21/2016   NA 138 05/06/2017   K 3.9 05/06/2017   CL 103 05/06/2017   CREATININE 0.78 05/06/2017   BUN 17 05/06/2017   CO2 32 05/06/2017   TSH 5.70 (H) 05/06/2017    Mm Screening Breast Tomo Bilateral  Result Date: 04/18/2014 CLINICAL DATA:  Screening. EXAM: DIGITAL SCREENING BILATERAL MAMMOGRAM WITH 3D TOMO WITH CAD COMPARISON:  Previous exam(s). ACR Breast Density Category c: The breast tissue is heterogeneously dense, which may obscure small masses. FINDINGS: There are no findings suspicious for malignancy. Images were processed with CAD. IMPRESSION: No mammographic evidence of malignancy. A result letter of this screening mammogram will be mailed directly to the patient. RECOMMENDATION: Screening mammogram in one year. (Code:SM-B-01Y) BI-RADS CATEGORY  1: Negative. Electronically Signed   By: Abelardo Diesel M.D.   On: 04/18/2014 14:16    Assessment & Plan:   Diagnoses and all orders for this visit:  Postoperative hypothyroidism -     Basic metabolic panel; Future -     TSH; Future  Loss of weight  Chronic right-sided low back pain with sciatica, sciatica laterality unspecified  Localized edema -     Basic metabolic panel; Future  Hypothyroidism due to acquired atrophy of thyroid -     Discontinue: oxyCODONE (OXYCONTIN) 40 mg 12 hr tablet; Take 1 tablet (40 mg total) by mouth every 6 (six) hours. Please fill on or after 04/23/17 -     Discontinue: oxyCODONE (OXYCONTIN) 80 mg 12 hr tablet; Take 1 tablet (80 mg total) by mouth every 6 (six) hours. Please fill on or after 04/23/17 -     Discontinue: oxyCODONE (OXYCONTIN) 80 mg 12 hr tablet; Take 1  tablet (80 mg total) by mouth every 6 (six) hours. Please fill on or after 05/24/17 -     Discontinue: oxyCODONE (OXYCONTIN) 40 mg 12 hr tablet; Take 1 tablet (40 mg total) by mouth every 6 (six) hours. Please fill on or after 05/24/17  Other orders -     furosemide (LASIX) 40 MG tablet; Take 1 tablet (40 mg total) by mouth daily. -     Discontinue: levothyroxine (SYNTHROID, LEVOTHROID) 25 MCG tablet; Take 2 tablets (50 mcg total) by mouth daily before breakfast.   I have discontinued Ms. Swaney's oxyCODONE, oxyCODONE, levothyroxine, oxyCODONE, and oxyCODONE. I am also having her maintain her aspirin, COENZYME Q-10 PO, albuterol, Cholecalciferol, potassium chloride SA, fluticasone, and furosemide. We will continue to administer sodium chloride.  Meds ordered this encounter  Medications  . furosemide (LASIX) 40 MG tablet    Sig:  Take 1 tablet (40 mg total) by mouth daily.    Dispense:  90 tablet    Refill:  3  . DISCONTD: levothyroxine (SYNTHROID, LEVOTHROID) 25 MCG tablet    Sig: Take 2 tablets (50 mcg total) by mouth daily before breakfast.    Dispense:  180 tablet    Refill:  3  . DISCONTD: oxyCODONE (OXYCONTIN) 40 mg 12 hr tablet    Sig: Take 1 tablet (40 mg total) by mouth every 6 (six) hours. Please fill on or after 04/23/17    Dispense:  120 tablet    Refill:  0  . DISCONTD: oxyCODONE (OXYCONTIN) 80 mg 12 hr tablet    Sig: Take 1 tablet (80 mg total) by mouth every 6 (six) hours. Please fill on or after 04/23/17    Dispense:  120 tablet    Refill:  0  . DISCONTD: oxyCODONE (OXYCONTIN) 80 mg 12 hr tablet    Sig: Take 1 tablet (80 mg total) by mouth every 6 (six) hours. Please fill on or after 05/24/17    Dispense:  120 tablet    Refill:  0  . DISCONTD: oxyCODONE (OXYCONTIN) 40 mg 12 hr tablet    Sig: Take 1 tablet (40 mg total) by mouth every 6 (six) hours. Please fill on or after 05/24/17    Dispense:  120 tablet    Refill:  0     Follow-up: Return in about 3 months (around  07/23/2017) for a follow-up visit.  Walker Kehr, MD

## 2017-06-23 ENCOUNTER — Telehealth: Payer: Self-pay

## 2017-06-23 ENCOUNTER — Ambulatory Visit (INDEPENDENT_AMBULATORY_CARE_PROVIDER_SITE_OTHER): Payer: Medicare Other

## 2017-06-23 DIAGNOSIS — Z23 Encounter for immunization: Secondary | ICD-10-CM

## 2017-06-23 NOTE — Telephone Encounter (Signed)
Patient has requested high dose flu vaccine, ok to give per dr plotnikov---patient advised that she may encounter out of pocket cost from her insurance co---high dose is typically for 57 years old and older--patient understands and still wants high dose

## 2017-07-22 ENCOUNTER — Ambulatory Visit: Payer: Medicare Other | Admitting: Internal Medicine

## 2017-07-22 ENCOUNTER — Encounter: Payer: Self-pay | Admitting: Internal Medicine

## 2017-07-22 ENCOUNTER — Ambulatory Visit (INDEPENDENT_AMBULATORY_CARE_PROVIDER_SITE_OTHER): Payer: Medicare Other | Admitting: Internal Medicine

## 2017-07-22 DIAGNOSIS — E034 Atrophy of thyroid (acquired): Secondary | ICD-10-CM

## 2017-07-22 DIAGNOSIS — M5440 Lumbago with sciatica, unspecified side: Secondary | ICD-10-CM

## 2017-07-22 DIAGNOSIS — R634 Abnormal weight loss: Secondary | ICD-10-CM | POA: Diagnosis not present

## 2017-07-22 DIAGNOSIS — G8929 Other chronic pain: Secondary | ICD-10-CM

## 2017-07-22 MED ORDER — OXYCODONE HCL ER 80 MG PO T12A: 80.0000 mg | EXTENDED_RELEASE_TABLET | Freq: Four times a day (QID) | ORAL | 0 refills | Status: DC

## 2017-07-22 MED ORDER — OXYCODONE HCL ER 40 MG PO T12A: 40.0000 mg | EXTENDED_RELEASE_TABLET | Freq: Four times a day (QID) | ORAL | 0 refills | Status: DC

## 2017-07-22 MED ORDER — LEVOTHYROXINE SODIUM 75 MCG PO TABS: 75.0000 ug | ORAL_TABLET | Freq: Every day | ORAL | 11 refills | Status: DC

## 2017-07-22 NOTE — Assessment & Plan Note (Signed)
Wt Readings from Last 3 Encounters:  07/22/17 121 lb (54.9 kg)  04/22/17 118 lb (53.5 kg)  03/27/17 118 lb (53.5 kg)

## 2017-07-22 NOTE — Assessment & Plan Note (Signed)
Oxycodone/Oxycontin Rx - long term  Potential benefits of a long term opioids use as well as potential risks (i.e. addiction risk, apnea etc) and complications (i.e. Somnolence, constipation and others) were explained to the patient and were aknowledged. 

## 2017-07-22 NOTE — Progress Notes (Signed)
Subjective:  Patient ID: Abigail Wilson, female    DOB: Feb 24, 1961  Age: 56 y.o. MRN: [id number]  CC: No chief complaint on file.   HPI MADGE THERRIEN presents for hypothyroidism and chronic pain, allergies f/u   Outpatient Medications Prior to Visit  Medication Sig Dispense Refill  . albuterol (PROVENTIL HFA;VENTOLIN HFA) 108 (90 Base) MCG/ACT inhaler Inhale 1-2 puffs into the lungs every 6 (six) hours as needed for wheezing or shortness of breath. 8.5 each 1  . aspirin 81 MG EC tablet Take 81 mg by mouth daily.      . Cholecalciferol 3000 units TABS Take 2 tablets by mouth daily.    [name] COENZYME Q-10 PO Take 200 mg by mouth daily.     . fluticasone (FLONASE) 50 MCG/ACT nasal spray Place 2 sprays into both nostrils daily. 16 g 11  . furosemide (LASIX) 40 MG tablet Take 1 tablet (40 mg total) by mouth daily. 90 tablet 3  . levothyroxine (SYNTHROID, LEVOTHROID) 50 MCG tablet Take 1 tablet (50 mcg total) by mouth daily. 30 tablet 11  . oxyCODONE (OXYCONTIN) 40 mg 12 hr tablet Take 1 tablet (40 mg total) by mouth every 6 (six) hours. Please fill on or after 06/23/17 120 tablet 0  . oxyCODONE (OXYCONTIN) 80 mg 12 hr tablet Take 1 tablet (80 mg total) by mouth every 6 (six) hours. Please fill on or after 06/23/17 120 tablet 0  . potassium chloride SA (KLOR-CON M20) 20 MEQ tablet Take 1 tablet (20 mEq total) by mouth daily. 90 tablet 3   Facility-Administered Medications Prior to Visit  Medication Dose Route Frequency Provider Last Rate Last Dose  . 0.9 %  sodium chloride infusion  500 mL Intravenous Continuous Ladene Artist, MD        ROS Review of Systems  Constitutional: Negative for activity change, appetite change, chills, fatigue and unexpected weight change.  HENT: Negative for congestion, mouth sores and sinus pressure.   Eyes: Negative for visual disturbance.  Respiratory: Negative for cough and chest tightness.   Gastrointestinal: Negative for abdominal pain and nausea.    Genitourinary: Negative for difficulty urinating, frequency and vaginal pain.  Musculoskeletal: Positive for back pain. Negative for gait problem.  Skin: Negative for pallor and rash.  Neurological: Negative for dizziness, tremors, weakness, numbness and headaches.  Psychiatric/Behavioral: Negative for confusion and sleep disturbance.    Objective:  BP 112/66 (BP Location: Left Arm, Patient Position: Sitting, Cuff Size: Normal)   Pulse 68   Temp 98.7 F (37.1 C) (Oral)   Ht 5\' 5"  (1.651 m)   Wt 121 lb (54.9 kg)   SpO2 98%   BMI 20.14 kg/m   BP Readings from Last 3 Encounters:  07/22/17 112/66  04/22/17 112/64  03/27/17 (!) 108/51    Wt Readings from Last 3 Encounters:  07/22/17 121 lb (54.9 kg)  04/22/17 118 lb (53.5 kg)  03/27/17 118 lb (53.5 kg)    Physical Exam  Constitutional: She appears well-developed. No distress.  HENT:  Head: Normocephalic.  Right Ear: External ear normal.  Left Ear: External ear normal.  Nose: Nose normal.  Mouth/Throat: Oropharynx is clear and moist.  Eyes: Conjunctivae are normal. Pupils are equal, round, and reactive to light. Right eye exhibits no discharge. Left eye exhibits no discharge.  Neck: Normal range of motion. Neck supple. No JVD present. No tracheal deviation present. No thyromegaly present.  Cardiovascular: Normal rate, regular rhythm and normal heart sounds.  Pulmonary/Chest: No  stridor. No respiratory distress. She has no wheezes.  Abdominal: Soft. Bowel sounds are normal. She exhibits no distension and no mass. There is no tenderness. There is no rebound and no guarding.  Musculoskeletal: She exhibits tenderness. She exhibits no edema.  Lymphadenopathy:    She has no cervical adenopathy.  Neurological: She displays normal reflexes. No cranial nerve deficit. She exhibits normal muscle tone. Coordination normal.  Skin: No rash noted. No erythema.  Psychiatric: She has a normal mood and affect. Her behavior is normal.  Judgment and thought content normal.    Lab Results  Component Value Date   WBC 5.5 07/21/2016   HGB 13.6 07/21/2016   HCT 39.7 07/21/2016   PLT 241.0 07/21/2016   GLUCOSE 93 05/06/2017   CHOL 214 (H) 07/21/2016   TRIG 115.0 07/21/2016   HDL 107.40 07/21/2016   LDLCALC 83 07/21/2016   ALT 12 07/21/2016   AST 21 07/21/2016   NA 138 05/06/2017   K 3.9 05/06/2017   CL 103 05/06/2017   CREATININE 0.78 05/06/2017   BUN 17 05/06/2017   CO2 32 05/06/2017   TSH 5.70 (H) 05/06/2017    Mm Screening Breast Tomo Bilateral  Result Date: 04/18/2014 CLINICAL DATA:  Screening. EXAM: DIGITAL SCREENING BILATERAL MAMMOGRAM WITH 3D TOMO WITH CAD COMPARISON:  Previous exam(s). ACR Breast Density Category c: The breast tissue is heterogeneously dense, which may obscure small masses. FINDINGS: There are no findings suspicious for malignancy. Images were processed with CAD. IMPRESSION: No mammographic evidence of malignancy. A result letter of this screening mammogram will be mailed directly to the patient. RECOMMENDATION: Screening mammogram in one year. (Code:SM-B-01Y) BI-RADS CATEGORY  1: Negative. Electronically Signed   By: Abelardo Diesel M.D.   On: 04/18/2014 14:16    Assessment & Plan:   There are no diagnoses linked to this encounter. I am having Abigail Wilson maintain her aspirin, COENZYME Q-10 PO, albuterol, Cholecalciferol, potassium chloride SA, fluticasone, furosemide, oxyCODONE, oxyCODONE, and levothyroxine. We will continue to administer sodium chloride.  No orders of the defined types were placed in this encounter.    Follow-up: No Follow-up on file.  Walker Kehr, MD

## 2017-07-22 NOTE — Assessment & Plan Note (Signed)
On Levothroid - increase the dose Labs in 2 mo

## 2017-08-06 DIAGNOSIS — L255 Unspecified contact dermatitis due to plants, except food: Secondary | ICD-10-CM | POA: Diagnosis not present

## 2017-09-18 ENCOUNTER — Other Ambulatory Visit: Payer: Self-pay | Admitting: Internal Medicine

## 2017-10-13 ENCOUNTER — Encounter: Payer: Self-pay | Admitting: Internal Medicine

## 2017-10-13 ENCOUNTER — Ambulatory Visit (INDEPENDENT_AMBULATORY_CARE_PROVIDER_SITE_OTHER): Payer: Medicare Other | Admitting: Internal Medicine

## 2017-10-13 VITALS — BP 116/72 | HR 61 | Temp 98.3°F | Ht 65.0 in | Wt 120.0 lb

## 2017-10-13 DIAGNOSIS — M5440 Lumbago with sciatica, unspecified side: Secondary | ICD-10-CM

## 2017-10-13 DIAGNOSIS — E034 Atrophy of thyroid (acquired): Secondary | ICD-10-CM

## 2017-10-13 DIAGNOSIS — J069 Acute upper respiratory infection, unspecified: Secondary | ICD-10-CM | POA: Diagnosis not present

## 2017-10-13 DIAGNOSIS — G8929 Other chronic pain: Secondary | ICD-10-CM | POA: Diagnosis not present

## 2017-10-13 MED ORDER — OXYCODONE HCL ER 80 MG PO T12A: 80.0000 mg | EXTENDED_RELEASE_TABLET | Freq: Four times a day (QID) | ORAL | 0 refills | Status: DC

## 2017-10-13 MED ORDER — AZITHROMYCIN 250 MG PO TABS: ORAL_TABLET | ORAL | 0 refills | Status: DC

## 2017-10-13 MED ORDER — OXYCODONE HCL ER 40 MG PO T12A
40.0000 mg | EXTENDED_RELEASE_TABLET | Freq: Four times a day (QID) | ORAL | 0 refills | Status: DC
Start: 2017-10-13 — End: 2017-11-13

## 2017-10-13 NOTE — Progress Notes (Signed)
Subjective:  Patient ID: Abigail Wilson, female    DOB: [birth year]  Age: 57 y.o. MRN: [id number]  CC: No chief complaint on file.   HPI Abigail Wilson presents for URI sx's x several days. Yellow mucus F/u LBP, hypothyroidism  Outpatient Medications Prior to Visit  Medication Sig Dispense Refill  . albuterol (PROVENTIL HFA;VENTOLIN HFA) 108 (90 Base) MCG/ACT inhaler Inhale 1-2 puffs into the lungs every 6 (six) hours as needed for wheezing or shortness of breath. 8.5 each 1  . aspirin 81 MG EC tablet Take 81 mg by mouth daily.      . Cholecalciferol 3000 units TABS Take 2 tablets by mouth daily.    [name] COENZYME Q-10 PO Take 200 mg by mouth daily.     . fluticasone (FLONASE) 50 MCG/ACT nasal spray Place 2 sprays into both nostrils daily. 16 g 11  . furosemide (LASIX) 40 MG tablet Take 1 tablet (40 mg total) by mouth daily. 90 tablet 3  . KLOR-CON M20 20 MEQ tablet TAKE 1 TABLET BY MOUTH EVERY DAY 90 tablet 3  . levothyroxine (SYNTHROID, LEVOTHROID) 75 MCG tablet Take 1 tablet (75 mcg total) daily by mouth. 30 tablet 11  . oxyCODONE (OXYCONTIN) 40 mg 12 hr tablet Take 1 tablet (40 mg total) every 6 (six) hours by mouth. Please fill on or after 09/23/17 120 tablet 0  . oxyCODONE (OXYCONTIN) 80 mg 12 hr tablet Take 1 tablet (80 mg total) every 6 (six) hours by mouth. Please fill on or after 09/23/17 120 tablet 0   Facility-Administered Medications Prior to Visit  Medication Dose Route Frequency Provider Last Rate Last Dose  . 0.9 %  sodium chloride infusion  500 mL Intravenous Continuous Ladene Artist, MD        ROS Review of Systems  Constitutional: Positive for chills. Negative for activity change, appetite change, fatigue and unexpected weight change.  HENT: Positive for congestion, dental problem, ear pain, sore throat and voice change. Negative for mouth sores and sinus pressure.   Eyes: Negative for visual disturbance.  Respiratory: Positive for cough. Negative for chest  tightness.   Gastrointestinal: Negative for abdominal pain and nausea.  Genitourinary: Negative for difficulty urinating, frequency and vaginal pain.  Musculoskeletal: Positive for back pain. Negative for gait problem.  Skin: Negative for pallor and rash.  Neurological: Negative for dizziness, tremors, weakness, numbness and headaches.  Psychiatric/Behavioral: Negative for confusion and sleep disturbance.    Objective:  BP 116/72 (BP Location: Left Arm, Patient Position: Sitting, Cuff Size: Normal)   Pulse 61   Temp 98.3 F (36.8 C) (Oral)   Ht 5\' 5"  (1.651 m)   Wt 120 lb (54.4 kg)   SpO2 98%   BMI 19.97 kg/m   BP Readings from Last 3 Encounters:  10/13/17 116/72  07/22/17 112/66  04/22/17 112/64    Wt Readings from Last 3 Encounters:  10/13/17 120 lb (54.4 kg)  07/22/17 121 lb (54.9 kg)  04/22/17 118 lb (53.5 kg)    Physical Exam  Constitutional: She appears well-developed. No distress.  HENT:  Head: Normocephalic.  Right Ear: External ear normal.  Left Ear: External ear normal.  Nose: Nose normal.  Mouth/Throat: Oropharynx is clear and moist.  Eyes: Conjunctivae are normal. Pupils are equal, round, and reactive to light. Right eye exhibits no discharge. Left eye exhibits no discharge.  Neck: Normal range of motion. Neck supple. No JVD present. No tracheal deviation present. No thyromegaly present.  Cardiovascular: Normal  rate, regular rhythm and normal heart sounds.  Pulmonary/Chest: No stridor. No respiratory distress. She has no wheezes.  Abdominal: Soft. Bowel sounds are normal. She exhibits no distension and no mass. There is no tenderness. There is no rebound and no guarding.  Musculoskeletal: She exhibits tenderness. She exhibits no edema.  Lymphadenopathy:    She has no cervical adenopathy.  Neurological: She displays normal reflexes. No cranial nerve deficit. She exhibits normal muscle tone. Coordination normal.  Skin: No rash noted. No erythema.    Psychiatric: She has a normal mood and affect. Her behavior is normal. Judgment and thought content normal.  eryth throat LS tender  Lab Results  Component Value Date   WBC 5.5 07/21/2016   HGB 13.6 07/21/2016   HCT 39.7 07/21/2016   PLT 241.0 07/21/2016   GLUCOSE 93 05/06/2017   CHOL 214 (H) 07/21/2016   TRIG 115.0 07/21/2016   HDL 107.40 07/21/2016   LDLCALC 83 07/21/2016   ALT 12 07/21/2016   AST 21 07/21/2016   NA 138 05/06/2017   K 3.9 05/06/2017   CL 103 05/06/2017   CREATININE 0.78 05/06/2017   BUN 17 05/06/2017   CO2 32 05/06/2017   TSH 5.70 (H) 05/06/2017    Mm Screening Breast Tomo Bilateral  Result Date: 04/18/2014 CLINICAL DATA:  Screening. EXAM: DIGITAL SCREENING BILATERAL MAMMOGRAM WITH 3D TOMO WITH CAD COMPARISON:  Previous exam(s). ACR Breast Density Category c: The breast tissue is heterogeneously dense, which may obscure small masses. FINDINGS: There are no findings suspicious for malignancy. Images were processed with CAD. IMPRESSION: No mammographic evidence of malignancy. A result letter of this screening mammogram will be mailed directly to the patient. RECOMMENDATION: Screening mammogram in one year. (Code:SM-B-01Y) BI-RADS CATEGORY  1: Negative. Electronically Signed   By: Abelardo Diesel M.D.   On: 04/18/2014 14:16    Assessment & Plan:   There are no diagnoses linked to this encounter. I am having Abigail Wilson maintain her aspirin, COENZYME Q-10 PO, albuterol, Cholecalciferol, fluticasone, furosemide, levothyroxine, oxyCODONE, oxyCODONE, and KLOR-CON M20. We will continue to administer sodium chloride.  No orders of the defined types were placed in this encounter.    Follow-up: No Follow-up on file.  Walker Kehr, MD

## 2017-10-13 NOTE — Patient Instructions (Signed)
You can use over-the-counter  "cold" medicines  such as "Tylenol cold" , "Advil cold",  "Mucinex" or" Mucinex D"  for cough and congestion.   Avoid decongestants if you have high blood pressure and use "Afrin" nasal spray for nasal congestion as directed. Use " Delsym" or" Robitussin" cough syrup varietis for cough.  You can use plain "Tylenol" or "Advil" for fever, chills and achyness. Use Halls or Ricola cough drops.   Please, make an appointment if you are not better or if you're worse.  

## 2017-10-18 NOTE — Assessment & Plan Note (Signed)
Oxycodone/OxyContin prescription renewed

## 2017-10-18 NOTE — Assessment & Plan Note (Signed)
On Levothroid 

## 2017-10-18 NOTE — Assessment & Plan Note (Signed)
Z-Pak

## 2017-10-21 ENCOUNTER — Ambulatory Visit: Payer: Medicare Other | Admitting: Internal Medicine

## 2017-11-13 ENCOUNTER — Encounter: Payer: Self-pay | Admitting: Internal Medicine

## 2017-11-13 ENCOUNTER — Other Ambulatory Visit (INDEPENDENT_AMBULATORY_CARE_PROVIDER_SITE_OTHER): Payer: Medicare Other

## 2017-11-13 ENCOUNTER — Ambulatory Visit (INDEPENDENT_AMBULATORY_CARE_PROVIDER_SITE_OTHER): Payer: Medicare Other | Admitting: Internal Medicine

## 2017-11-13 DIAGNOSIS — E034 Atrophy of thyroid (acquired): Secondary | ICD-10-CM

## 2017-11-13 DIAGNOSIS — G8929 Other chronic pain: Secondary | ICD-10-CM

## 2017-11-13 DIAGNOSIS — R634 Abnormal weight loss: Secondary | ICD-10-CM

## 2017-11-13 DIAGNOSIS — M5440 Lumbago with sciatica, unspecified side: Principal | ICD-10-CM

## 2017-11-13 DIAGNOSIS — E890 Postprocedural hypothyroidism: Secondary | ICD-10-CM | POA: Diagnosis not present

## 2017-11-13 LAB — TSH: TSH: 4.76 u[IU]/mL — AB (ref 0.35–4.50)

## 2017-11-13 MED ORDER — OXYCODONE HCL ER 80 MG PO T12A: 80.0000 mg | EXTENDED_RELEASE_TABLET | Freq: Four times a day (QID) | ORAL | 0 refills | Status: DC

## 2017-11-13 MED ORDER — OXYCODONE HCL ER 40 MG PO T12A: 40.0000 mg | EXTENDED_RELEASE_TABLET | Freq: Four times a day (QID) | ORAL | 0 refills | Status: DC

## 2017-11-13 NOTE — Progress Notes (Signed)
Subjective:  Patient ID: Abigail Wilson, female    DOB: Feb 11, 1961  Age: 57 y.o. MRN: [id number]  CC: No chief complaint on file.   HPI KHILA PAPP presents for LBP, hypothyroidism f/u  Outpatient Medications Prior to Visit  Medication Sig Dispense Refill  . albuterol (PROVENTIL HFA;VENTOLIN HFA) 108 (90 Base) MCG/ACT inhaler Inhale 1-2 puffs into the lungs every 6 (six) hours as needed for wheezing or shortness of breath. 8.5 each 1  . aspirin 81 MG EC tablet Take 81 mg by mouth daily.      . Cholecalciferol 3000 units TABS Take 2 tablets by mouth daily.    [name] COENZYME Q-10 PO Take 200 mg by mouth daily.     . fluticasone (FLONASE) 50 MCG/ACT nasal spray Place 2 sprays into both nostrils daily. 16 g 11  . furosemide (LASIX) 40 MG tablet Take 1 tablet (40 mg total) by mouth daily. 90 tablet 3  . KLOR-CON M20 20 MEQ tablet TAKE 1 TABLET BY MOUTH EVERY DAY 90 tablet 3  . levothyroxine (SYNTHROID, LEVOTHROID) 75 MCG tablet Take 1 tablet (75 mcg total) daily by mouth. 30 tablet 11  . oxyCODONE (OXYCONTIN) 40 mg 12 hr tablet Take 1 tablet (40 mg total) by mouth every 6 (six) hours. Please fill on or after 10/24/17 120 tablet 0  . oxyCODONE (OXYCONTIN) 80 mg 12 hr tablet Take 1 tablet (80 mg total) by mouth every 6 (six) hours. Please fill on or after 10/24/17 120 tablet 0  . azithromycin (ZITHROMAX Z-PAK) 250 MG tablet As directed 6 tablet 0   Facility-Administered Medications Prior to Visit  Medication Dose Route Frequency Provider Last Rate Last Dose  . 0.9 %  sodium chloride infusion  500 mL Intravenous Continuous Ladene Artist, MD        ROS Review of Systems  Constitutional: Negative for activity change, appetite change, chills, fatigue and unexpected weight change.  HENT: Negative for congestion, mouth sores and sinus pressure.   Eyes: Negative for visual disturbance.  Respiratory: Negative for cough and chest tightness.   Gastrointestinal: Negative for abdominal pain and  nausea.  Genitourinary: Negative for difficulty urinating, frequency and vaginal pain.  Musculoskeletal: Positive for back pain. Negative for gait problem.  Skin: Negative for pallor and rash.  Neurological: Negative for dizziness, tremors, weakness, numbness and headaches.  Psychiatric/Behavioral: Negative for confusion, sleep disturbance and suicidal ideas.    Objective:  BP 116/68 (BP Location: Left Arm, Patient Position: Sitting, Cuff Size: Normal)   Pulse 65   Temp 98.4 F (36.9 C) (Oral)   Ht 5\' 5"  (1.651 m)   Wt 124 lb (56.2 kg)   SpO2 98%   BMI 20.63 kg/m   BP Readings from Last 3 Encounters:  11/13/17 116/68  10/13/17 116/72  07/22/17 112/66    Wt Readings from Last 3 Encounters:  11/13/17 124 lb (56.2 kg)  10/13/17 120 lb (54.4 kg)  07/22/17 121 lb (54.9 kg)    Physical Exam  Constitutional: She appears well-developed. No distress.  HENT:  Head: Normocephalic.  Right Ear: External ear normal.  Left Ear: External ear normal.  Nose: Nose normal.  Mouth/Throat: Oropharynx is clear and moist.  Eyes: Conjunctivae are normal. Pupils are equal, round, and reactive to light. Right eye exhibits no discharge. Left eye exhibits no discharge.  Neck: Normal range of motion. Neck supple. No JVD present. No tracheal deviation present. No thyromegaly present.  Cardiovascular: Normal rate, regular rhythm and normal heart  sounds.  Pulmonary/Chest: No stridor. No respiratory distress. She has no wheezes.  Abdominal: Soft. Bowel sounds are normal. She exhibits no distension and no mass. There is no tenderness. There is no rebound and no guarding.  Musculoskeletal: She exhibits no edema or tenderness.  Lymphadenopathy:    She has no cervical adenopathy.  Neurological: She displays normal reflexes. No cranial nerve deficit. She exhibits normal muscle tone. Coordination normal.  Skin: No rash noted. No erythema.  Psychiatric: She has a normal mood and affect. Her behavior is  normal. Judgment and thought content normal.    Lab Results  Component Value Date   WBC 5.5 07/21/2016   HGB 13.6 07/21/2016   HCT 39.7 07/21/2016   PLT 241.0 07/21/2016   GLUCOSE 93 05/06/2017   CHOL 214 (H) 07/21/2016   TRIG 115.0 07/21/2016   HDL 107.40 07/21/2016   LDLCALC 83 07/21/2016   ALT 12 07/21/2016   AST 21 07/21/2016   NA 138 05/06/2017   K 3.9 05/06/2017   CL 103 05/06/2017   CREATININE 0.78 05/06/2017   BUN 17 05/06/2017   CO2 32 05/06/2017   TSH 5.70 (H) 05/06/2017    Mm Screening Breast Tomo Bilateral  Result Date: 04/18/2014 CLINICAL DATA:  Screening. EXAM: DIGITAL SCREENING BILATERAL MAMMOGRAM WITH 3D TOMO WITH CAD COMPARISON:  Previous exam(s). ACR Breast Density Category c: The breast tissue is heterogeneously dense, which may obscure small masses. FINDINGS: There are no findings suspicious for malignancy. Images were processed with CAD. IMPRESSION: No mammographic evidence of malignancy. A result letter of this screening mammogram will be mailed directly to the patient. RECOMMENDATION: Screening mammogram in one year. (Code:SM-B-01Y) BI-RADS CATEGORY  1: Negative. Electronically Signed   By: Abelardo Diesel M.D.   On: 04/18/2014 14:16    Assessment & Plan:   There are no diagnoses linked to this encounter. I have discontinued Harini Dearmond. Mcmannis's azithromycin. I am also having her maintain her aspirin, COENZYME Q-10 PO, albuterol, Cholecalciferol, fluticasone, furosemide, levothyroxine, KLOR-CON M20, oxyCODONE, and oxyCODONE. We will continue to administer sodium chloride.  No orders of the defined types were placed in this encounter.    Follow-up: No Follow-up on file.  Walker Kehr, MD

## 2017-11-13 NOTE — Assessment & Plan Note (Signed)
TSH 

## 2017-11-13 NOTE — Assessment & Plan Note (Signed)
Wt Readings from Last 3 Encounters:  11/13/17 124 lb (56.2 kg)  10/13/17 120 lb (54.4 kg)  07/22/17 121 lb (54.9 kg)

## 2017-11-13 NOTE — Assessment & Plan Note (Signed)
Oxycod/Oxycont UDS

## 2017-11-14 LAB — PMP SCREEN PROFILE (10S), URINE
Amphetamine Scrn, Ur: NEGATIVE ng/mL
BARBITURATE SCREEN URINE: NEGATIVE ng/mL
BENZODIAZEPINE SCREEN, URINE: NEGATIVE ng/mL
CANNABINOIDS UR QL SCN: NEGATIVE ng/mL
CREATININE(CRT), U: 98.7 mg/dL (ref 20.0–300.0)
Cocaine (Metab) Scrn, Ur: NEGATIVE ng/mL
METHADONE SCREEN, URINE: NEGATIVE ng/mL
OPIATE SCREEN URINE: POSITIVE ng/mL — AB
OXYCODONE+OXYMORPHONE UR QL SCN: POSITIVE ng/mL — AB
PH UR, DRUG SCRN: 6.3 (ref 4.5–8.9)
Phencyclidine Qn, Ur: NEGATIVE ng/mL
Propoxyphene Scrn, Ur: NEGATIVE ng/mL

## 2017-11-15 ENCOUNTER — Other Ambulatory Visit: Payer: Self-pay | Admitting: Internal Medicine

## 2017-11-15 MED ORDER — LEVOTHYROXINE SODIUM 88 MCG PO TABS
88.0000 ug | ORAL_TABLET | Freq: Every day | ORAL | 3 refills | Status: DC
Start: 2017-11-15 — End: 2018-11-07

## 2017-12-07 DIAGNOSIS — H04123 Dry eye syndrome of bilateral lacrimal glands: Secondary | ICD-10-CM | POA: Diagnosis not present

## 2017-12-07 DIAGNOSIS — H25813 Combined forms of age-related cataract, bilateral: Secondary | ICD-10-CM | POA: Diagnosis not present

## 2018-01-25 ENCOUNTER — Ambulatory Visit (INDEPENDENT_AMBULATORY_CARE_PROVIDER_SITE_OTHER): Payer: Medicare Other | Admitting: Obstetrics & Gynecology

## 2018-01-25 ENCOUNTER — Encounter: Payer: Self-pay | Admitting: Obstetrics & Gynecology

## 2018-01-25 VITALS — BP 114/72 | Ht 65.0 in | Wt 121.0 lb

## 2018-01-25 DIAGNOSIS — Z780 Asymptomatic menopausal state: Secondary | ICD-10-CM

## 2018-01-25 DIAGNOSIS — Z13820 Encounter for screening for osteoporosis: Secondary | ICD-10-CM

## 2018-01-25 DIAGNOSIS — Z124 Encounter for screening for malignant neoplasm of cervix: Secondary | ICD-10-CM | POA: Diagnosis not present

## 2018-01-25 DIAGNOSIS — Z01419 Encounter for gynecological examination (general) (routine) without abnormal findings: Secondary | ICD-10-CM

## 2018-01-25 DIAGNOSIS — M8580 Other specified disorders of bone density and structure, unspecified site: Secondary | ICD-10-CM

## 2018-01-25 NOTE — Progress Notes (Signed)
Abigail Wilson [birth year] [id number]   History:    57 y.o. G4P2A2L2  Single.  Retired Marine scientist.  [employer].  RP:  New, >3 yrs ago, patient presenting for annual gyn exam   HPI: Menopause, well without hormone replacement therapy.  No postmenopausal bleeding.  No pelvic pain.  Abstinent for the last 3 years.  Normal vaginal secretions.  Urine and bowel movements normal.  Breasts normal.  Walks regularly and goes to the gym twice a week.  Body mass index 20.14.  Fasting health labs with family physician.  Colonoscopy 2018.  Past medical history,surgical history, family history and social history were all reviewed and documented in the EPIC chart.  Gynecologic History No LMP recorded. Patient is postmenopausal. Contraception: abstinence and post menopausal status Last Pap: 03/2014. Results were: Negative/HPV HR neg Last mammogram: 04/2014. Results were: Negative Bone Density: Osteopenia 2006, will schedule here Colonoscopy: 2018  Obstetric History OB History  Gravida Para Term Preterm AB Living  4 2     2 2   SAB TAB Ectopic Multiple Live Births               # Outcome Date GA Lbr Len/2nd Weight Sex Delivery Anes PTL Lv  4 AB           3 AB           2 Para           1 Para              ROS: A ROS was performed and pertinent positives and negatives are included in the history.  GENERAL: No fevers or chills. HEENT: No change in vision, no earache, sore throat or sinus congestion. NECK: No pain or stiffness. CARDIOVASCULAR: No chest pain or pressure. No palpitations. PULMONARY: No shortness of breath, cough or wheeze. GASTROINTESTINAL: No abdominal pain, nausea, vomiting or diarrhea, melena or bright red blood per rectum. GENITOURINARY: No urinary frequency, urgency, hesitancy or dysuria. MUSCULOSKELETAL: No joint or muscle pain, no back pain, no recent trauma. DERMATOLOGIC: No rash, no itching, no lesions. ENDOCRINE: No polyuria, polydipsia, no heat or cold  intolerance. No recent change in weight. HEMATOLOGICAL: No anemia or easy bruising or bleeding. NEUROLOGIC: No headache, seizures, numbness, tingling or weakness. PSYCHIATRIC: No depression, no loss of interest in normal activity or change in sleep pattern.     Exam:   BP 114/72   Ht 5\' 5"  (1.651 m)   Wt 121 lb (54.9 kg)   BMI 20.14 kg/m   Body mass index is 20.14 kg/m.  General appearance : Well developed well nourished female. No acute distress HEENT: Eyes: no retinal hemorrhage or exudates,  Neck supple, trachea midline, no carotid bruits, no thyroidmegaly Lungs: Clear to auscultation, no rhonchi or wheezes, or rib retractions  Heart: Regular rate and rhythm, no murmurs or gallops Breast:Examined in sitting and supine position were symmetrical in appearance, no palpable masses or tenderness,  no skin retraction, no nipple inversion, no nipple discharge, no skin discoloration, no axillary or supraclavicular lymphadenopathy Abdomen: no palpable masses or tenderness, no rebound or guarding Extremities: no edema or skin discoloration or tenderness  Pelvic: Vulva: Normal             Vagina: No gross lesions or discharge  Cervix: No gross lesions or discharge.  Pap reflex done today  Uterus  AV, normal size, shape and consistency, non-tender and mobile  Adnexa  Without masses or tenderness  Anus: Normal  Assessment/Plan:  57 y.o. female for annual exam   1. Encounter for routine gynecological examination with Papanicolaou smear of cervix Normal gynecologic exam.  Pap reflex done today.  Breast exam normal.  Will schedule screening mammogram now.  Colonoscopy 2018.  Body mass index 20.14.  Continue with regular physical activity, recommend aerobic activities 5 times a week and weightlifting every 2 days.  2. Menopause present Well on no hormone replacement therapy.  No postmenopausal bleeding.  3. Osteopenia, unspecified location Recommend vitamin D supplements, calcium rich  nutrition and regular weightbearing physical activity.  Schedule bone density here now.  4. Screening for osteoporosis As above. - DG Bone Density; Future  Counseling on above issues and coordination of care more than 50% for 10 minutes.  Princess Bruins MD, 3:22 PM 01/25/2018

## 2018-01-26 LAB — PAP IG W/ RFLX HPV ASCU

## 2018-01-27 ENCOUNTER — Encounter: Payer: Self-pay | Admitting: Obstetrics & Gynecology

## 2018-01-27 NOTE — Patient Instructions (Signed)
1. Encounter for routine gynecological examination with Papanicolaou smear of cervix Normal gynecologic exam.  Pap reflex done today.  Breast exam normal.  Will schedule screening mammogram now.  Colonoscopy 2018.  Body mass index 20.14.  Continue with regular physical activity, recommend aerobic activities 5 times a week and weightlifting every 2 days.  2. Menopause present Well on no hormone replacement therapy.  No postmenopausal bleeding.  3. Osteopenia, unspecified location Recommend vitamin D supplements, calcium rich nutrition and regular weightbearing physical activity.  Schedule bone density here now.  4. Screening for osteoporosis As above. - DG Bone Density; Future  Abigail Wilson, it was a pleasure meeting you today!  I will inform you of your bone density results when they are available.   Bone Health Bones protect organs, store calcium, and anchor muscles. Good health habits, such as eating nutritious foods and exercising regularly, are important for maintaining healthy bones. They can also help to prevent a condition that causes bones to lose density and become weak and brittle (osteoporosis). Why is bone mass important? Bone mass refers to the amount of bone tissue that you have. The higher your bone mass, the stronger your bones. An important step toward having healthy bones throughout life is to have strong and dense bones during childhood. A young adult who has a high bone mass is more likely to have a high bone mass later in life. Bone mass at its greatest it is called peak bone mass. A large decline in bone mass occurs in older adults. In women, it occurs about the time of menopause. During this time, it is important to practice good health habits, because if more bone is lost than what is replaced, the bones will become less healthy and more likely to break (fracture). If you find that you have a low bone mass, you may be able to prevent osteoporosis or further bone loss by  changing your diet and lifestyle. How can I find out if my bone mass is low? Bone mass can be measured with an X-ray test that is called a bone mineral density (BMD) test. This test is recommended for all women who are age 46 or older. It may also be recommended for men who are age 44 or older, or for people who are more likely to develop osteoporosis due to:  Having bones that break easily.  Having a long-term disease that weakens bones, such as kidney disease or rheumatoid arthritis.  Having menopause earlier than normal.  Taking medicine that weakens bones, such as steroids, thyroid hormones, or hormone treatment for breast cancer or prostate cancer.  Smoking.  Drinking three or more alcoholic drinks each day.  What are the nutritional recommendations for healthy bones? To have healthy bones, you need to get enough of the right minerals and vitamins. Most nutrition experts recommend getting these nutrients from the foods that you eat. Nutritional recommendations vary from person to person. Ask your health care provider what is healthy for you. Here are some general guidelines. Calcium Recommendations Calcium is the most important (essential) mineral for bone health. Most people can get enough calcium from their diet, but supplements may be recommended for people who are at risk for osteoporosis. Good sources of calcium include:  Dairy products, such as low-fat or nonfat milk, cheese, and yogurt.  Dark green leafy vegetables, such as bok choy and broccoli.  Calcium-fortified foods, such as orange juice, cereal, bread, soy beverages, and tofu products.  Nuts, such as almonds.  Follow these  recommended amounts for daily calcium intake:  Children, age 108?3: 700 mg.  Children, age 9?8: 1,000 mg.  Children, age 81?13: 1,300 mg.  Teens, age 55?18: 1,300 mg.  Adults, age 84?50: 1,000 mg.  Adults, age 59?70: ? Men: 1,000 mg. ? Women: 1,200 mg.  Adults, age 79 or older: 1,200  mg.  Pregnant and breastfeeding females: ? Teens: 1,300 mg. ? Adults: 1,000 mg.  Vitamin D Recommendations Vitamin D is the most essential vitamin for bone health. It helps the body to absorb calcium. Sunlight stimulates the skin to make vitamin D, so be sure to get enough sunlight. If you live in a cold climate or you do not get outside often, your health care provider may recommend that you take vitamin D supplements. Good sources of vitamin D in your diet include:  Egg yolks.  Saltwater fish.  Milk and cereal fortified with vitamin D.  Follow these recommended amounts for daily vitamin D intake:  Children and teens, age 28?18: 27 international units.  Adults, age 63 or younger: 400-800 international units.  Adults, age 58 or older: 800-1,000 international units.  Other Nutrients Other nutrients for bone health include:  Phosphorus. This mineral is found in meat, poultry, dairy foods, nuts, and legumes. The recommended daily intake for adult men and adult women is 700 mg.  Magnesium. This mineral is found in seeds, nuts, dark green vegetables, and legumes. The recommended daily intake for adult men is 400?420 mg. For adult women, it is 310?320 mg.  Vitamin K. This vitamin is found in green leafy vegetables. The recommended daily intake is 120 mg for adult men and 90 mg for adult women.  What type of physical activity is best for building and maintaining healthy bones? Weight-bearing and strength-building activities are important for building and maintaining peak bone mass. Weight-bearing activities cause muscles and bones to work against gravity. Strength-building activities increases muscle strength that supports bones. Weight-bearing and muscle-building activities include:  Walking and hiking.  Jogging and running.  Dancing.  Gym exercises.  Lifting weights.  Tennis and racquetball.  Climbing stairs.  Aerobics.  Adults should get at least 30 minutes of moderate  physical activity on most days. Children should get at least 60 minutes of moderate physical activity on most days. Ask your health care provide what type of exercise is best for you. Where can I find more information? For more information, check out the following websites:  [place]: [employer].se  [employer] of Health: http://www.niams.AnonymousEar.fr.asp  This information is not intended to replace advice given to you by your health care provider. Make sure you discuss any questions you have with your health care provider. Document Released: 11/22/2003 Document Revised: 03/21/2016 Document Reviewed: 09/06/2014 Elsevier Interactive Patient Education  [employer].

## 2018-01-27 NOTE — Addendum Note (Signed)
Addended by: Alen Blew on: 01/27/2018 12:09 PM   Modules accepted: Level of Service

## 2018-02-05 ENCOUNTER — Ambulatory Visit (INDEPENDENT_AMBULATORY_CARE_PROVIDER_SITE_OTHER): Payer: Medicare Other | Admitting: Internal Medicine

## 2018-02-05 ENCOUNTER — Encounter: Payer: Self-pay | Admitting: Internal Medicine

## 2018-02-05 DIAGNOSIS — M5440 Lumbago with sciatica, unspecified side: Secondary | ICD-10-CM | POA: Diagnosis not present

## 2018-02-05 DIAGNOSIS — G8929 Other chronic pain: Secondary | ICD-10-CM | POA: Diagnosis not present

## 2018-02-05 DIAGNOSIS — E034 Atrophy of thyroid (acquired): Secondary | ICD-10-CM | POA: Diagnosis not present

## 2018-02-05 MED ORDER — OXYCODONE HCL ER 80 MG PO T12A: 80.0000 mg | EXTENDED_RELEASE_TABLET | Freq: Four times a day (QID) | ORAL | 0 refills | Status: DC

## 2018-02-05 MED ORDER — OXYCODONE HCL ER 40 MG PO T12A: 40.0000 mg | EXTENDED_RELEASE_TABLET | Freq: Four times a day (QID) | ORAL | 0 refills | Status: DC

## 2018-02-05 NOTE — Progress Notes (Signed)
Subjective:  Patient ID: Abigail Wilson, female    DOB: 01-14-1961  Age: 57 y.o. MRN: [id number]  CC: No chief complaint on file.   HPI ZIA NAJERA presents for LBP, hypothyroidism  Outpatient Medications Prior to Visit  Medication Sig Dispense Refill  . albuterol (PROVENTIL HFA;VENTOLIN HFA) 108 (90 Base) MCG/ACT inhaler Inhale 1-2 puffs into the lungs every 6 (six) hours as needed for wheezing or shortness of breath. 8.5 each 1  . aspirin 81 MG EC tablet Take 81 mg by mouth daily.      . Cholecalciferol 3000 units TABS Take 2 tablets by mouth daily.    [name] COENZYME Q-10 PO Take 200 mg by mouth daily.     . fluticasone (FLONASE) 50 MCG/ACT nasal spray Place 2 sprays into both nostrils daily. 16 g 11  . furosemide (LASIX) 40 MG tablet Take 1 tablet (40 mg total) by mouth daily. 90 tablet 3  . KLOR-CON M20 20 MEQ tablet TAKE 1 TABLET BY MOUTH EVERY DAY 90 tablet 3  . levothyroxine (SYNTHROID, LEVOTHROID) 88 MCG tablet Take 1 tablet (88 mcg total) by mouth daily. 90 tablet 3  . oxyCODONE (OXYCONTIN) 40 mg 12 hr tablet Take 1 tablet (40 mg total) by mouth every 6 (six) hours. Please fill on or after 01/21/18 120 tablet 0  . oxyCODONE (OXYCONTIN) 80 mg 12 hr tablet Take 1 tablet (80 mg total) by mouth every 6 (six) hours. Please fill on or after 01/21/18 120 tablet 0   Facility-Administered Medications Prior to Visit  Medication Dose Route Frequency Provider Last Rate Last Dose  . 0.9 %  sodium chloride infusion  500 mL Intravenous Continuous Ladene Artist, MD        ROS: Review of Systems  Constitutional: Positive for fatigue. Negative for activity change, appetite change, chills and unexpected weight change.  HENT: Negative for congestion, mouth sores and sinus pressure.   Eyes: Negative for visual disturbance.  Respiratory: Negative for cough and chest tightness.   Gastrointestinal: Negative for abdominal pain and nausea.  Genitourinary: Negative for difficulty urinating,  frequency and vaginal pain.  Musculoskeletal: Positive for back pain. Negative for gait problem.  Skin: Negative for pallor and rash.  Neurological: Negative for dizziness, tremors, weakness, numbness and headaches.  Psychiatric/Behavioral: Negative for confusion, sleep disturbance and suicidal ideas.    Objective:  BP 130/70 (BP Location: Right Arm, Patient Position: Sitting, Cuff Size: Normal)   Pulse 68   Ht 5\' 5"  (1.651 m)   Wt 121 lb (54.9 kg)   SpO2 98%   BMI 20.14 kg/m   BP Readings from Last 3 Encounters:  02/05/18 130/70  01/25/18 114/72  11/13/17 116/68    Wt Readings from Last 3 Encounters:  02/05/18 121 lb (54.9 kg)  01/25/18 121 lb (54.9 kg)  11/13/17 124 lb (56.2 kg)    Physical Exam  Constitutional: She appears well-developed. No distress.  HENT:  Head: Normocephalic.  Right Ear: External ear normal.  Left Ear: External ear normal.  Nose: Nose normal.  Mouth/Throat: Oropharynx is clear and moist.  Eyes: Pupils are equal, round, and reactive to light. Conjunctivae are normal. Right eye exhibits no discharge. Left eye exhibits no discharge.  Neck: Normal range of motion. Neck supple. No JVD present. No tracheal deviation present. No thyromegaly present.  Cardiovascular: Normal rate, regular rhythm and normal heart sounds.  Pulmonary/Chest: No stridor. No respiratory distress. She has no wheezes.  Abdominal: Soft. Bowel sounds are normal. She  exhibits no distension and no mass. There is no tenderness. There is no rebound and no guarding.  Musculoskeletal: She exhibits tenderness. She exhibits no edema.  Lymphadenopathy:    She has no cervical adenopathy.  Neurological: She displays normal reflexes. No cranial nerve deficit. She exhibits normal muscle tone. Coordination normal.  Skin: No rash noted. No erythema.  Psychiatric: She has a normal mood and affect. Her behavior is normal. Judgment and thought content normal.    Lab Results  Component Value Date     WBC 5.5 07/21/2016   HGB 13.6 07/21/2016   HCT 39.7 07/21/2016   PLT 241.0 07/21/2016   GLUCOSE 93 05/06/2017   CHOL 214 (H) 07/21/2016   TRIG 115.0 07/21/2016   HDL 107.40 07/21/2016   LDLCALC 83 07/21/2016   ALT 12 07/21/2016   AST 21 07/21/2016   NA 138 05/06/2017   K 3.9 05/06/2017   CL 103 05/06/2017   CREATININE 0.78 05/06/2017   BUN 17 05/06/2017   CO2 32 05/06/2017   TSH 4.76 (H) 11/13/2017    Mm Screening Breast Tomo Bilateral  Result Date: 04/18/2014 CLINICAL DATA:  Screening. EXAM: DIGITAL SCREENING BILATERAL MAMMOGRAM WITH 3D TOMO WITH CAD COMPARISON:  Previous exam(s). ACR Breast Density Category c: The breast tissue is heterogeneously dense, which may obscure small masses. FINDINGS: There are no findings suspicious for malignancy. Images were processed with CAD. IMPRESSION: No mammographic evidence of malignancy. A result letter of this screening mammogram will be mailed directly to the patient. RECOMMENDATION: Screening mammogram in one year. (Code:SM-B-01Y) BI-RADS CATEGORY  1: Negative. Electronically Signed   By: Abelardo Diesel M.D.   On: 04/18/2014 14:16    Assessment & Plan:   Diagnoses and all orders for this visit:  Hypothyroidism due to acquired atrophy of thyroid     No orders of the defined types were placed in this encounter.    Follow-up: No follow-ups on file.  Walker Kehr, MD

## 2018-02-05 NOTE — Assessment & Plan Note (Signed)
Chronic Oxycodone/Oxycontin Rx - long term  Potential benefits of a long term opioids use as well as potential risks (i.e. addiction risk, apnea etc) and complications (i.e. Somnolence, constipation and others) were explained to the patient and were aknowledged. 

## 2018-02-05 NOTE — Assessment & Plan Note (Signed)
On Levothroid 

## 2018-02-11 ENCOUNTER — Ambulatory Visit: Payer: Self-pay | Admitting: Hematology

## 2018-02-11 NOTE — Telephone Encounter (Signed)
Patient states she found the ticks on her today.  Patient states the area was originally small, but is now spreading to dime sized area, is red in color, and has discharge.  Patient requested and afternoon appointment around 2.  Appointment made for tomorrow 02-12-18 at 1:40 with Jodi Mourning Reason for Disposition . [1] Red or very tender (to touch) area AND [2] started over 24 hours after the bite  Answer Assessment - Initial Assessment Questions 1. TYPE of TICK: "Is it a wood tick or a deer tick?" If unsure, ask: "What size was the tick?" "Did it look more like a watermelon seed or a poppy seed?"      Brown in color with white spot on back, about the size of the tip end of an eraser 2. LOCATION: "Where is the tick bite located?"   right upper rib cage, lower left side 3. ONSET: "How long do you think the tick was attached before you removed it?" (Hours or days)    12hours.   4. TETANUS: "When was the last tetanus booster?"     Within the last 5 years  5. PREGNANCY: "Is there any chance you are pregnant?" "When was your [address] st menstrual period?"     N/A  Protocols used: TICK BITE-A-AH

## 2018-02-12 ENCOUNTER — Ambulatory Visit (INDEPENDENT_AMBULATORY_CARE_PROVIDER_SITE_OTHER): Payer: Medicare Other | Admitting: Family

## 2018-02-12 ENCOUNTER — Encounter: Payer: Self-pay | Admitting: Family

## 2018-02-12 VITALS — BP 110/68 | HR 66 | Temp 97.9°F | Ht 65.0 in | Wt 120.0 lb

## 2018-02-12 DIAGNOSIS — L309 Dermatitis, unspecified: Secondary | ICD-10-CM | POA: Diagnosis not present

## 2018-02-12 DIAGNOSIS — W57XXXA Bitten or stung by nonvenomous insect and other nonvenomous arthropods, initial encounter: Secondary | ICD-10-CM | POA: Diagnosis not present

## 2018-02-12 MED ORDER — DOXYCYCLINE HYCLATE 100 MG PO TABS: 100.0000 mg | ORAL_TABLET | Freq: Two times a day (BID) | ORAL | 0 refills | Status: DC

## 2018-02-12 NOTE — Progress Notes (Signed)
Abigail Wilson is a 57 y.o. female with the following history as recorded in [employer]:  Patient Active Problem List   Diagnosis Date Noted  . Edema 04/22/2017  . History of hepatitis C 07/22/2016  . Maxillary sinusitis, acute 01/04/2016  . Loss of weight 01/02/2015  . Benign lipomatous neoplasm of skin and subcutaneous tissue of left leg 10/02/2014  . Osteopenia 03/31/2014  . Mass of foot or toe 11/28/2013  . Well adult exam 12/09/2011  . History of hepatitis B 12/09/2011  . Low blood pressure, not hypotension 04/21/2011  . BLEPHARITIS 10/07/2010  . Acute upper respiratory infection 08/13/2010  . GOITER, MULTINODULAR 03/19/2010  . Hypothyroidism 03/19/2010  . ACUTE LYMPHADENITIS 03/15/2010  . TOBACCO USE, QUIT 07/25/2009  . 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 ROSACEA 03/14/2009  . 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  . ABDOMINAL PAIN 10/31/2008  . PNEUMONIA, ORGANISM UNSPECIFIED 05/29/2008  . BRONCHITIS, ACUTE 01/11/2008  . Depression 06/04/2007  . OSTEOARTHRITIS 06/04/2007  . LOW BACK PAIN 06/04/2007  . OSTEOPOROSIS 03/27/2007    Current Outpatient Medications  Medication Sig Dispense Refill  . albuterol (PROVENTIL HFA;VENTOLIN HFA) 108 (90 Base) MCG/ACT inhaler Inhale 1-2 puffs into the lungs every 6 (six) hours as needed for wheezing or shortness of breath. 8.5 each 1  . aspirin 81 MG EC tablet Take 81 mg by mouth daily.      . Cholecalciferol 3000 units TABS Take 2 tablets by mouth daily.    [name] COENZYME Q-10 PO Take 200 mg by mouth daily.     . fluticasone (FLONASE) 50 MCG/ACT nasal spray Place 2 sprays into both nostrils daily. 16 g 11  . furosemide (LASIX) 40 MG tablet Take 1 tablet (40 mg total) by mouth daily. 90 tablet 3  . KLOR-CON M20 20 MEQ tablet TAKE 1 TABLET BY MOUTH EVERY DAY 90 tablet 3  . levothyroxine (SYNTHROID, LEVOTHROID) 88 MCG tablet Take 1 tablet (88 mcg total) by mouth daily. 90 tablet 3  . oxyCODONE (OXYCONTIN) 40 mg 12 hr tablet Take 1 tablet (40 mg total) by  mouth every 6 (six) hours. Please fill on or after 04/23/18 120 tablet 0  . oxyCODONE (OXYCONTIN) 80 mg 12 hr tablet Take 1 tablet (80 mg total) by mouth every 6 (six) hours. Please fill on or after 04/23/18 120 tablet 0  . doxycycline (VIBRA-TABS) 100 MG tablet Take 1 tablet (100 mg total) by mouth 2 (two) times daily. 20 tablet 0   Current Facility-Administered Medications  Medication Dose Route Frequency Provider Last Rate Last Dose  . 0.9 %  sodium chloride infusion  500 mL Intravenous Continuous Ladene Artist, MD        Allergies: Nsaids and Trazodone hcl  Past Medical History:  Diagnosis Date  . Asthmatic bronchitis    Mild  . Cataract   . Depression   . Hepatitis C   . Hyperthyroidism    s/p 131Iodine Rx 2007  . Hypothyroidism   . LBP (low back pain)   . Menopause   . OA (osteoarthritis of spine)   . OA (osteoarthritis)   . Osteoporosis     Past Surgical History:  Procedure Laterality Date  . CESAREAN SECTION    . COLONOSCOPY W/ POLYPECTOMY      Family History  Problem Relation Age of Onset  . Heart disease Father   . Hyperlipidemia Father   . Hypertension Father   . Diabetes Father   . Cancer Father   . Hypertension Other   .  Colon cancer Neg Hx   . Pancreatic cancer Neg Hx   . Stomach cancer Neg Hx   . Esophageal cancer Neg Hx     Social History   Tobacco Use  . Smoking status: Former Smoker    Packs/day: 0.25    Types: Cigarettes    Last attempt to quit: 03/31/2013    Years since quitting: 4.8  . Smokeless tobacco: Never Used  Substance Use Topics  . Alcohol use: Yes    Comment: OCC     Subjective:  Patient presents with concerns for tick exposure; pulled 2 ticks off herself yesterday- one under right upper arm and one on left upper back; thinks that tick might have been attached for up to 2 days; denies any fever or body aches or unexplained rashes; is having some nausea today; admits she has pulled numerous ticks off herself in the past few weeks.    Objective:  Vitals:   02/12/18 1338  BP: 110/68  Pulse: 66  Temp: 97.9 F (36.6 C)  TempSrc: Oral  SpO2: 96%  Weight: 120 lb (54.4 kg)  Height: 5\' 5"  (1.651 m)    General: Well developed, well nourished, in no acute distress  Skin : Warm and dry. Areas c/w insect bite noted on right upper trunk/ left lower flank; Head: Normocephalic and atraumatic  Lungs: Respirations unlabored; clear to auscultation bilaterally without wheeze, rales, rhonchi  Neurologic: Alert and oriented; speech intact; face symmetrical; moves all extremities well; CNII-XII intact without focal deficit   Assessment:  1. Dermatitis   2. Tick bite, initial encounter     Plan:  Will treat for localized reaction; Rx for Doxycycline 100 mg bid x 10 days; discussed concerning symptoms to call back if she develops rash, fever, unexplained joint aches and will extend treatment.  No follow-ups on file.  No orders of the defined types were placed in this encounter.   Requested Prescriptions   Signed Prescriptions Disp Refills  . doxycycline (VIBRA-TABS) 100 MG tablet 20 tablet 0    Sig: Take 1 tablet (100 mg total) by mouth 2 (two) times daily.

## 2018-02-16 ENCOUNTER — Ambulatory Visit: Payer: Medicare Other | Admitting: Internal Medicine

## 2018-02-19 ENCOUNTER — Telehealth: Payer: Self-pay

## 2018-02-19 ENCOUNTER — Other Ambulatory Visit: Payer: Self-pay | Admitting: Family

## 2018-02-19 MED ORDER — DOXYCYCLINE HYCLATE 100 MG PO TABS: 100.0000 mg | ORAL_TABLET | Freq: Two times a day (BID) | ORAL | 0 refills | Status: DC

## 2018-02-19 NOTE — Telephone Encounter (Signed)
I will send in 4 more days for her; if symptoms still persisting, needs to be seen.

## 2018-02-19 NOTE — Addendum Note (Signed)
Addended by: Sherlene Shams on: 02/19/2018 05:45 PM   Modules accepted: Orders

## 2018-02-19 NOTE — Telephone Encounter (Signed)
Copied from [place] [id number]. Topic: Inquiry >> Feb 19, 2018 12:30 PM Pricilla Handler wrote: Reason for CRM: Patient saw Jodi Mourning last Friday for Tick Bites. Patient stated that she was given doxycycline (VIBRA-TABS) 100 MG tablets. Patient also states that Mickel Baas told her to call in if she needed more doxycycline. Patient would like another supply of the medication sent to her preferred pharmacy: [id number] - [place], [place] - [address] [phone] (Phone)  [phone] (Fax).       Thank You!!!

## 2018-02-22 NOTE — Telephone Encounter (Signed)
Spoke with patient and info given 

## 2018-04-14 ENCOUNTER — Other Ambulatory Visit: Payer: Self-pay | Admitting: Internal Medicine

## 2018-04-23 ENCOUNTER — Telehealth: Payer: Self-pay | Admitting: Internal Medicine

## 2018-04-23 NOTE — Telephone Encounter (Signed)
OSTEOARTHRITIS ICD Code given to pharmacy staff, [name]

## 2018-04-23 NOTE — Telephone Encounter (Signed)
Copied from [place] [id number]. Topic: Quick Communication - See Telephone Encounter >> Apr 23, 2018 10:39 AM Neva Seat wrote: [facility] is stating that [facility] are starting [facility] new program needing new documentation for pt's medication oxyCODONE (OXYCONTIN) 40 mg 12 hr tablet and oxyCODONE (OXYCONTIN) 80 mg 12 hr tablet.  Needing diagnosis code and approval. Please call  [phone]  - [place] or [place].  Pt is needing a refill at this time.

## 2018-04-24 NOTE — Telephone Encounter (Signed)
LBP M54.5  Chronic pain , on same meds >10 years

## 2018-05-07 ENCOUNTER — Encounter: Payer: Self-pay | Admitting: Internal Medicine

## 2018-05-07 ENCOUNTER — Ambulatory Visit (INDEPENDENT_AMBULATORY_CARE_PROVIDER_SITE_OTHER): Payer: Medicare Other | Admitting: Internal Medicine

## 2018-05-07 VITALS — BP 116/72 | HR 65 | Temp 98.1°F | Ht 65.0 in | Wt 120.0 lb

## 2018-05-07 DIAGNOSIS — E890 Postprocedural hypothyroidism: Secondary | ICD-10-CM | POA: Diagnosis not present

## 2018-05-07 DIAGNOSIS — E034 Atrophy of thyroid (acquired): Secondary | ICD-10-CM | POA: Diagnosis not present

## 2018-05-07 DIAGNOSIS — M5440 Lumbago with sciatica, unspecified side: Secondary | ICD-10-CM | POA: Diagnosis not present

## 2018-05-07 DIAGNOSIS — G8929 Other chronic pain: Secondary | ICD-10-CM | POA: Diagnosis not present

## 2018-05-07 DIAGNOSIS — G894 Chronic pain syndrome: Secondary | ICD-10-CM | POA: Diagnosis not present

## 2018-05-07 MED ORDER — OXYCODONE HCL ER 80 MG PO T12A: 80.0000 mg | EXTENDED_RELEASE_TABLET | Freq: Four times a day (QID) | ORAL | 0 refills | Status: DC

## 2018-05-07 MED ORDER — OXYCODONE HCL ER 40 MG PO T12A: 40.0000 mg | EXTENDED_RELEASE_TABLET | Freq: Four times a day (QID) | ORAL | 0 refills | Status: DC

## 2018-05-07 NOTE — Assessment & Plan Note (Signed)
Levothroid 

## 2018-05-07 NOTE — Assessment & Plan Note (Signed)
Oxycodone/Oxycontin Rx - long term  Potential benefits of a long term opioids use as well as potential risks (i.e. addiction risk, apnea etc) and complications (i.e. Somnolence, constipation and others) were explained to the patient and were aknowledged. 

## 2018-05-07 NOTE — Progress Notes (Signed)
Subjective:  Patient ID: Abigail Wilson, female    DOB: 11/03/1960  Age: 57 y.o. MRN: [id number]  CC: No chief complaint on file.   HPI Abigail Wilson presents for LBP, hypothyroidism f/u  Outpatient Medications Prior to Visit  Medication Sig Dispense Refill  . albuterol (PROVENTIL HFA;VENTOLIN HFA) 108 (90 Base) MCG/ACT inhaler Inhale 1-2 puffs into the lungs every 6 (six) hours as needed for wheezing or shortness of breath. 8.5 each 1  . aspirin 81 MG EC tablet Take 81 mg by mouth daily.      . Cholecalciferol 3000 units TABS Take 2 tablets by mouth daily.    [name] COENZYME Q-10 PO Take 200 mg by mouth daily.     . fluticasone (FLONASE) 50 MCG/ACT nasal spray Place 2 sprays into both nostrils daily. 16 g 11  . furosemide (LASIX) 40 MG tablet TAKE 1 TABLET BY MOUTH EVERY DAY 90 tablet 3  . KLOR-CON M20 20 MEQ tablet TAKE 1 TABLET BY MOUTH EVERY DAY 90 tablet 3  . levothyroxine (SYNTHROID, LEVOTHROID) 88 MCG tablet Take 1 tablet (88 mcg total) by mouth daily. 90 tablet 3  . oxyCODONE (OXYCONTIN) 40 mg 12 hr tablet Take 1 tablet (40 mg total) by mouth every 6 (six) hours. Please fill on or after 04/23/18 120 tablet 0  . oxyCODONE (OXYCONTIN) 80 mg 12 hr tablet Take 1 tablet (80 mg total) by mouth every 6 (six) hours. Please fill on or after 04/23/18 120 tablet 0  . doxycycline (VIBRA-TABS) 100 MG tablet Take 1 tablet (100 mg total) by mouth 2 (two) times daily. (Patient not taking: Reported on 05/07/2018) 20 tablet 0  . doxycycline (VIBRA-TABS) 100 MG tablet Take 1 tablet (100 mg total) by mouth 2 (two) times daily. (Patient not taking: Reported on 05/07/2018) 8 tablet 0   No facility-administered medications prior to visit.     ROS: Review of Systems  Constitutional: Negative for activity change, appetite change, chills, fatigue and unexpected weight change.  HENT: Negative for congestion, mouth sores and sinus pressure.   Eyes: Negative for visual disturbance.  Respiratory: Negative  for cough and chest tightness.   Gastrointestinal: Negative for abdominal pain and nausea.  Genitourinary: Negative for difficulty urinating, frequency and vaginal pain.  Musculoskeletal: Positive for back pain. Negative for gait problem.  Skin: Negative for pallor and rash.  Neurological: Negative for dizziness, tremors, weakness, numbness and headaches.  Psychiatric/Behavioral: Negative for confusion and sleep disturbance.    Objective:  BP 116/72 (BP Location: Left Arm, Patient Position: Sitting, Cuff Size: Normal)   Pulse 65   Temp 98.1 F (36.7 C) (Oral)   Ht 5\' 5"  (1.651 m)   Wt 120 lb (54.4 kg)   SpO2 96%   BMI 19.97 kg/m   BP Readings from Last 3 Encounters:  05/07/18 116/72  02/12/18 110/68  02/05/18 130/70    Wt Readings from Last 3 Encounters:  05/07/18 120 lb (54.4 kg)  02/12/18 120 lb (54.4 kg)  02/05/18 121 lb (54.9 kg)    Physical Exam  Constitutional: She appears well-developed. No distress.  HENT:  Head: Normocephalic.  Right Ear: External ear normal.  Left Ear: External ear normal.  Nose: Nose normal.  Mouth/Throat: Oropharynx is clear and moist.  Eyes: Pupils are equal, round, and reactive to light. Conjunctivae are normal. Right eye exhibits no discharge. Left eye exhibits no discharge.  Neck: Normal range of motion. Neck supple. No JVD present. No tracheal deviation present. No thyromegaly  present.  Cardiovascular: Normal rate, regular rhythm and normal heart sounds.  Pulmonary/Chest: No stridor. No respiratory distress. She has no wheezes.  Abdominal: Soft. Bowel sounds are normal. She exhibits no distension and no mass. There is no tenderness. There is no rebound and no guarding.  Musculoskeletal: She exhibits tenderness. She exhibits no edema.  Lymphadenopathy:    She has no cervical adenopathy.  Neurological: She displays normal reflexes. No cranial nerve deficit. She exhibits normal muscle tone. Coordination normal.  Skin: No rash noted. No  erythema.  Psychiatric: She has a normal mood and affect. Her behavior is normal. Judgment and thought content normal.  LS tender  Lab Results  Component Value Date   WBC 5.5 07/21/2016   HGB 13.6 07/21/2016   HCT 39.7 07/21/2016   PLT 241.0 07/21/2016   GLUCOSE 93 05/06/2017   CHOL 214 (H) 07/21/2016   TRIG 115.0 07/21/2016   HDL 107.40 07/21/2016   LDLCALC 83 07/21/2016   ALT 12 07/21/2016   AST 21 07/21/2016   NA 138 05/06/2017   K 3.9 05/06/2017   CL 103 05/06/2017   CREATININE 0.78 05/06/2017   BUN 17 05/06/2017   CO2 32 05/06/2017   TSH 4.76 (H) 11/13/2017    Mm Screening Breast Tomo Bilateral  Result Date: 04/18/2014 CLINICAL DATA:  Screening. EXAM: DIGITAL SCREENING BILATERAL MAMMOGRAM WITH 3D TOMO WITH CAD COMPARISON:  Previous exam(s). ACR Breast Density Category c: The breast tissue is heterogeneously dense, which may obscure small masses. FINDINGS: There are no findings suspicious for malignancy. Images were processed with CAD. IMPRESSION: No mammographic evidence of malignancy. A result letter of this screening mammogram will be mailed directly to the patient. RECOMMENDATION: Screening mammogram in one year. (Code:SM-B-01Y) BI-RADS CATEGORY  1: Negative. Electronically Signed   By: Abelardo Diesel M.D.   On: 04/18/2014 14:16    Assessment & Plan:   There are no diagnoses linked to this encounter.   No orders of the defined types were placed in this encounter.    Follow-up: No follow-ups on file.  Walker Kehr, MD

## 2018-05-07 NOTE — Assessment & Plan Note (Signed)
On meds

## 2018-05-25 ENCOUNTER — Ambulatory Visit: Payer: Self-pay | Admitting: Internal Medicine

## 2018-05-25 NOTE — Telephone Encounter (Signed)
Pt states that she was bit by a dog on 9/5 and cannot get any health information about dog (no records of vaccines, ect) and is requesting to speak with triage nurse to get advice as to what she needs to do. Pt states that she is having no symptoms. Please advise.      Patient states she has a small bite on her elbow that she got on Thursday- she has been keeping it clean and it is healing without sing of infection. Her main concern is she is not sure that the dog is vaccinated because the neighbor has not produced records- which leads her to believe that the dog is not up to date. She wants to know what she needs to do now.  Patient states she feels an ED visit may be to late at this point since bite was 9/5 and is healing fine. Discussed possible need for immune globulin series and she wants PCP input.   Reason for Disposition . [1] Any break in skin from BITE (e.g., cut, puncture or scratch) AND[2] PET animial (e.g., dog, cat, or ferret) at risk for RABIES (e.g., sick, stray, unprovoked bite, developing country)  Answer Assessment - Initial Assessment Questions 1. ANIMAL: "What type of animal caused the bite?" "Is the injury from a bite or a claw?" If the animal is a dog or a cat, ask: "Was it a pet or a stray?" "Was it acting ill or behaving strangely?"     Dog bite, dog was startled, pet of neighbor, not acting ill or strangely 2. LOCATION: "Where is the bite located?"      R outer elbow 3. SIZE: "How big is the bite?" "What does it look like?"      Spread out over 1 inch- 4 puncher wounds- healing well at this time 4. ONSET: "When did the bite happen?" (Minutes or hours ago)      Thursday evening 5. CIRCUMSTANCES: "Tell me how this happened."      Patient states she walked up on neighbors porch while dog was eating- she did not notice the dog and startled the dog- she states it was a pinch and grab bite- she states it was as much her fault as the dogs fault.  6. TETANUS: "When was the last  tetanus booster?"     Yes- up to date 7. PREGNANCY: "Is there any chance you are pregnant?" "When was your last menstrual period?"     n/a  Protocols used: ANIMAL BITE-A-AH

## 2018-05-25 NOTE — Telephone Encounter (Signed)
Please advise 

## 2018-05-26 ENCOUNTER — Telehealth: Payer: Self-pay | Admitting: Internal Medicine

## 2018-05-26 NOTE — Telephone Encounter (Signed)
Other TE was sent to Dr. Alain Marion, waiting for reply

## 2018-05-26 NOTE — Telephone Encounter (Signed)
Copied from [place] [id number]. Topic: Quick Communication - See Telephone Encounter >> May 26, 2018 11:15 AM Rutherford Nail, NT wrote: CRM for notification. See Telephone encounter for: 05/26/18. Patient calling in regards to Triage encounter on 05/25/18. Would like to know what Dr Alain Marion would like for her to do?

## 2018-07-05 ENCOUNTER — Ambulatory Visit (INDEPENDENT_AMBULATORY_CARE_PROVIDER_SITE_OTHER): Payer: Medicare Other

## 2018-07-05 DIAGNOSIS — Z23 Encounter for immunization: Secondary | ICD-10-CM | POA: Diagnosis not present

## 2018-07-05 NOTE — Progress Notes (Signed)
Per pt request high dose. Has received due to hx for 2 years.

## 2018-08-10 ENCOUNTER — Encounter: Payer: Self-pay | Admitting: Internal Medicine

## 2018-08-10 ENCOUNTER — Ambulatory Visit (INDEPENDENT_AMBULATORY_CARE_PROVIDER_SITE_OTHER): Payer: Medicare Other | Admitting: Internal Medicine

## 2018-08-10 DIAGNOSIS — M5440 Lumbago with sciatica, unspecified side: Secondary | ICD-10-CM

## 2018-08-10 DIAGNOSIS — G894 Chronic pain syndrome: Secondary | ICD-10-CM | POA: Diagnosis not present

## 2018-08-10 DIAGNOSIS — E890 Postprocedural hypothyroidism: Secondary | ICD-10-CM | POA: Diagnosis not present

## 2018-08-10 DIAGNOSIS — E034 Atrophy of thyroid (acquired): Secondary | ICD-10-CM | POA: Diagnosis not present

## 2018-08-10 DIAGNOSIS — G8929 Other chronic pain: Secondary | ICD-10-CM | POA: Diagnosis not present

## 2018-08-10 MED ORDER — OXYCODONE HCL ER 80 MG PO T12A: 80.0000 mg | EXTENDED_RELEASE_TABLET | Freq: Four times a day (QID) | ORAL | 0 refills | Status: DC

## 2018-08-10 MED ORDER — OXYCODONE HCL ER 40 MG PO T12A: 40.0000 mg | EXTENDED_RELEASE_TABLET | Freq: Four times a day (QID) | ORAL | 0 refills | Status: DC

## 2018-08-10 NOTE — Progress Notes (Signed)
Subjective:  Patient ID: Abigail Wilson, female    DOB: 03-18-1961  Age: 57 y.o. MRN: [id number]  CC: No chief complaint on file.   HPI TAMARI REDWINE presents for chronic pain, hypothyroidism f/u  Outpatient Medications Prior to Visit  Medication Sig Dispense Refill  . albuterol (PROVENTIL HFA;VENTOLIN HFA) 108 (90 Base) MCG/ACT inhaler Inhale 1-2 puffs into the lungs every 6 (six) hours as needed for wheezing or shortness of breath. 8.5 each 1  . aspirin 81 MG EC tablet Take 81 mg by mouth daily.      . Cholecalciferol 3000 units TABS Take 2 tablets by mouth daily.    [name] COENZYME Q-10 PO Take 200 mg by mouth daily.     . fluticasone (FLONASE) 50 MCG/ACT nasal spray Place 2 sprays into both nostrils daily. 16 g 11  . furosemide (LASIX) 40 MG tablet TAKE 1 TABLET BY MOUTH EVERY DAY 90 tablet 3  . KLOR-CON M20 20 MEQ tablet TAKE 1 TABLET BY MOUTH EVERY DAY 90 tablet 3  . levothyroxine (SYNTHROID, LEVOTHROID) 88 MCG tablet Take 1 tablet (88 mcg total) by mouth daily. 90 tablet 3  . oxyCODONE (OXYCONTIN) 40 mg 12 hr tablet Take 1 tablet (40 mg total) by mouth every 6 (six) hours. Please fill on or after 07/24/18. Dx: M54.5 and G89.4 120 tablet 0  . oxyCODONE (OXYCONTIN) 80 mg 12 hr tablet Take 1 tablet (80 mg total) by mouth every 6 (six) hours. Please fill on or after 07/24/18. Dx: M54.5 and G89.4 120 tablet 0   No facility-administered medications prior to visit.     ROS: Review of Systems  Constitutional: Negative for activity change, appetite change, chills, fatigue and unexpected weight change.  HENT: Negative for congestion, mouth sores and sinus pressure.   Eyes: Negative for visual disturbance.  Respiratory: Negative for cough and chest tightness.   Gastrointestinal: Negative for abdominal pain and nausea.  Genitourinary: Negative for difficulty urinating, frequency and vaginal pain.  Musculoskeletal: Positive for back pain. Negative for gait problem.  Skin: Negative for  pallor and rash.  Neurological: Negative for dizziness, tremors, weakness, numbness and headaches.  Psychiatric/Behavioral: Negative for confusion, sleep disturbance and suicidal ideas.    Objective:  BP 118/70 (BP Location: Left Arm, Patient Position: Sitting, Cuff Size: Normal)   Pulse 68   Temp 98.2 F (36.8 C) (Oral)   Ht 5\' 5"  (1.651 m)   Wt 119 lb (54 kg)   SpO2 96%   BMI 19.80 kg/m   BP Readings from Last 3 Encounters:  08/10/18 118/70  05/07/18 116/72  02/12/18 110/68    Wt Readings from Last 3 Encounters:  08/10/18 119 lb (54 kg)  05/07/18 120 lb (54.4 kg)  02/12/18 120 lb (54.4 kg)    Physical Exam  Constitutional: She appears well-developed. No distress.  HENT:  Head: Normocephalic.  Right Ear: External ear normal.  Left Ear: External ear normal.  Nose: Nose normal.  Mouth/Throat: Oropharynx is clear and moist.  Eyes: Pupils are equal, round, and reactive to light. Conjunctivae are normal. Right eye exhibits no discharge. Left eye exhibits no discharge.  Neck: Normal range of motion. Neck supple. No JVD present. No tracheal deviation present. No thyromegaly present.  Cardiovascular: Normal rate, regular rhythm and normal heart sounds.  Pulmonary/Chest: No stridor. No respiratory distress. She has no wheezes.  Abdominal: Soft. Bowel sounds are normal. She exhibits no distension and no mass. There is no tenderness. There is no rebound and  no guarding.  Musculoskeletal: She exhibits tenderness. She exhibits no edema.  Lymphadenopathy:    She has no cervical adenopathy.  Neurological: She displays normal reflexes. No cranial nerve deficit. She exhibits normal muscle tone. Coordination normal.  Skin: No rash noted. No erythema.  Psychiatric: She has a normal mood and affect. Her behavior is normal. Judgment and thought content normal.  LS tender  Lab Results  Component Value Date   WBC 5.5 07/21/2016   HGB 13.6 07/21/2016   HCT 39.7 07/21/2016   PLT 241.0  07/21/2016   GLUCOSE 93 05/06/2017   CHOL 214 (H) 07/21/2016   TRIG 115.0 07/21/2016   HDL 107.40 07/21/2016   LDLCALC 83 07/21/2016   ALT 12 07/21/2016   AST 21 07/21/2016   NA 138 05/06/2017   K 3.9 05/06/2017   CL 103 05/06/2017   CREATININE 0.78 05/06/2017   BUN 17 05/06/2017   CO2 32 05/06/2017   TSH 4.76 (H) 11/13/2017    Mm Screening Breast Tomo Bilateral  Result Date: 04/18/2014 CLINICAL DATA:  Screening. EXAM: DIGITAL SCREENING BILATERAL MAMMOGRAM WITH 3D TOMO WITH CAD COMPARISON:  Previous exam(s). ACR Breast Density Category c: The breast tissue is heterogeneously dense, which may obscure small masses. FINDINGS: There are no findings suspicious for malignancy. Images were processed with CAD. IMPRESSION: No mammographic evidence of malignancy. A result letter of this screening mammogram will be mailed directly to the patient. RECOMMENDATION: Screening mammogram in one year. (Code:SM-B-01Y) BI-RADS CATEGORY  1: Negative. Electronically Signed   By: Abelardo Diesel M.D.   On: 04/18/2014 14:16    Assessment & Plan:   There are no diagnoses linked to this encounter.   No orders of the defined types were placed in this encounter.    Follow-up: No follow-ups on file.  Walker Kehr, MD

## 2018-08-10 NOTE — Assessment & Plan Note (Signed)
On Levothroid 

## 2018-08-10 NOTE — Assessment & Plan Note (Signed)
Chronic Oxycodone/Oxycontin Rx - long term  Potential benefits of a long term opioids use as well as potential risks (i.e. addiction risk, apnea etc) and complications (i.e. Somnolence, constipation and others) were explained to the patient and were aknowledged. 

## 2018-08-10 NOTE — Patient Instructions (Signed)

## 2018-08-10 NOTE — Assessment & Plan Note (Signed)
On Rx 

## 2018-09-21 ENCOUNTER — Telehealth: Payer: Self-pay | Admitting: Internal Medicine

## 2018-09-21 DIAGNOSIS — Z720 Tobacco use: Secondary | ICD-10-CM

## 2018-09-21 NOTE — Telephone Encounter (Signed)
Copied from [place] [id number]. Topic: Quick Communication - See Telephone Encounter >> Sep 21, 2018  2:43 PM Margot Ables wrote: CRM for notification. See Telephone encounter for: 09/21/18.  Pt called and states she and Dr. Alain Marion discussed Cardiac CT calcium scoring test $150. She would like to proceed and asking what she needs to do. Please advise.

## 2018-09-24 NOTE — Telephone Encounter (Signed)
CT ordered, LM notifying pt

## 2018-10-19 ENCOUNTER — Ambulatory Visit (INDEPENDENT_AMBULATORY_CARE_PROVIDER_SITE_OTHER)
Admission: RE | Admit: 2018-10-19 | Discharge: 2018-10-19 | Disposition: A | Discharge status: Home / Self Care | Payer: Self-pay | Source: Ambulatory Visit | Attending: Internal Medicine | Admitting: Internal Medicine

## 2018-10-19 DIAGNOSIS — Z720 Tobacco use: Secondary | ICD-10-CM

## 2018-10-27 ENCOUNTER — Encounter: Payer: Self-pay | Admitting: Emergency Medicine

## 2018-11-07 ENCOUNTER — Other Ambulatory Visit: Payer: Self-pay | Admitting: Internal Medicine

## 2018-11-11 ENCOUNTER — Ambulatory Visit (INDEPENDENT_AMBULATORY_CARE_PROVIDER_SITE_OTHER): Payer: Medicare Other | Admitting: Internal Medicine

## 2018-11-11 ENCOUNTER — Encounter: Payer: Self-pay | Admitting: Internal Medicine

## 2018-11-11 VITALS — BP 118/76 | HR 66 | Temp 98.5°F | Ht 65.0 in | Wt 127.0 lb

## 2018-11-11 DIAGNOSIS — M8580 Other specified disorders of bone density and structure, unspecified site: Secondary | ICD-10-CM

## 2018-11-11 DIAGNOSIS — G8929 Other chronic pain: Secondary | ICD-10-CM | POA: Diagnosis not present

## 2018-11-11 DIAGNOSIS — E890 Postprocedural hypothyroidism: Secondary | ICD-10-CM

## 2018-11-11 DIAGNOSIS — I2583 Coronary atherosclerosis due to lipid rich plaque: Secondary | ICD-10-CM | POA: Diagnosis not present

## 2018-11-11 DIAGNOSIS — E034 Atrophy of thyroid (acquired): Secondary | ICD-10-CM | POA: Diagnosis not present

## 2018-11-11 DIAGNOSIS — I2510 Atherosclerotic heart disease of native coronary artery without angina pectoris: Secondary | ICD-10-CM | POA: Diagnosis not present

## 2018-11-11 DIAGNOSIS — M5440 Lumbago with sciatica, unspecified side: Secondary | ICD-10-CM | POA: Diagnosis not present

## 2018-11-11 DIAGNOSIS — R634 Abnormal weight loss: Secondary | ICD-10-CM | POA: Diagnosis not present

## 2018-11-11 MED ORDER — RED YEAST RICE 600 MG PO CAPS: ORAL_CAPSULE | ORAL | 3 refills | Status: DC

## 2018-11-11 MED ORDER — OXYCODONE HCL ER 80 MG PO T12A: 80.0000 mg | EXTENDED_RELEASE_TABLET | Freq: Four times a day (QID) | ORAL | 0 refills | Status: DC

## 2018-11-11 MED ORDER — OXYCODONE HCL ER 40 MG PO T12A: 40.0000 mg | EXTENDED_RELEASE_TABLET | Freq: Four times a day (QID) | ORAL | 0 refills | Status: DC

## 2018-11-11 NOTE — Assessment & Plan Note (Signed)
Chronic Oxycodone/Oxycontin Rx - long term  Potential benefits of a long term opioids use as well as potential risks (i.e. addiction risk, apnea etc) and complications (i.e. Somnolence, constipation and others) were explained to the patient and were aknowledged. 

## 2018-11-11 NOTE — Assessment & Plan Note (Signed)
Levothroid 

## 2018-11-11 NOTE — Assessment & Plan Note (Signed)
Vit D 

## 2018-11-11 NOTE — Patient Instructions (Signed)
If you have medicare related insurance (such as traditional Medicare, Blue Cross Medicare, United HealthCare Medicare, or similar), Please make an appointment at the scheduling desk with Jill, the Wellness Health Coach, for your Wellness visit in this office, which is a benefit with your insurance.  

## 2018-11-11 NOTE — Progress Notes (Signed)
Subjective:  Patient ID: Abigail Wilson, female    DOB: 1961-05-06  Age: 58 y.o. MRN: [id number]  CC: No chief complaint on file.   HPI TYRONDA VIZCARRONDO presents for chronic pain, hypothyroidism, abn CT  Outpatient Medications Prior to Visit  Medication Sig Dispense Refill  . albuterol (PROVENTIL HFA;VENTOLIN HFA) 108 (90 Base) MCG/ACT inhaler Inhale 1-2 puffs into the lungs every 6 (six) hours as needed for wheezing or shortness of breath. 8.5 each 1  . aspirin 81 MG EC tablet Take 81 mg by mouth daily.      . Cholecalciferol 3000 units TABS Take 2 tablets by mouth daily.    [name] COENZYME Q-10 PO Take 200 mg by mouth daily.     . fluticasone (FLONASE) 50 MCG/ACT nasal spray Place 2 sprays into both nostrils daily. 16 g 11  . furosemide (LASIX) 40 MG tablet TAKE 1 TABLET BY MOUTH EVERY DAY 90 tablet 3  . KLOR-CON M20 20 MEQ tablet TAKE 1 TABLET BY MOUTH EVERY DAY 90 tablet 3  . levothyroxine (SYNTHROID, LEVOTHROID) 88 MCG tablet TAKE 1 TABLET BY MOUTH EVERY DAY 90 tablet 3  . oxyCODONE (OXYCONTIN) 40 mg 12 hr tablet Take 1 tablet (40 mg total) by mouth every 6 (six) hours. Please fill on or after 10/24/18. Dx: M54.5 and G89.4 120 tablet 0  . oxyCODONE (OXYCONTIN) 80 mg 12 hr tablet Take 1 tablet (80 mg total) by mouth every 6 (six) hours. Please fill on or after 10/24/18. Dx: M54.5 and G89.4 120 tablet 0   No facility-administered medications prior to visit.     ROS: Review of Systems  Constitutional: Negative for activity change, appetite change, chills, fatigue and unexpected weight change.  HENT: Negative for congestion, mouth sores and sinus pressure.   Eyes: Negative for visual disturbance.  Respiratory: Negative for cough and chest tightness.   Gastrointestinal: Negative for abdominal pain and nausea.  Genitourinary: Negative for difficulty urinating, frequency and vaginal pain.  Musculoskeletal: Positive for back pain. Negative for gait problem.  Skin: Negative for pallor and  rash.  Neurological: Negative for dizziness, tremors, weakness, numbness and headaches.  Psychiatric/Behavioral: Negative for confusion, sleep disturbance and suicidal ideas.    Objective:  BP 118/76 (BP Location: Left Arm, Patient Position: Sitting, Cuff Size: Normal)   Pulse 66   Temp 98.5 F (36.9 C) (Oral)   Ht 5\' 5"  (1.651 m)   Wt 127 lb (57.6 kg)   SpO2 99%   BMI 21.13 kg/m   BP Readings from Last 3 Encounters:  11/11/18 118/76  08/10/18 118/70  05/07/18 116/72    Wt Readings from Last 3 Encounters:  11/11/18 127 lb (57.6 kg)  08/10/18 119 lb (54 kg)  05/07/18 120 lb (54.4 kg)    Physical Exam Constitutional:      General: She is not in acute distress.    Appearance: She is well-developed.  HENT:     Head: Normocephalic.     Right Ear: External ear normal.     Left Ear: External ear normal.     Nose: Nose normal.  Eyes:     General:        Right eye: No discharge.        Left eye: No discharge.     Conjunctiva/sclera: Conjunctivae normal.     Pupils: Pupils are equal, round, and reactive to light.  Neck:     Musculoskeletal: Normal range of motion and neck supple.     Thyroid:  No thyromegaly.     Vascular: No JVD.     Trachea: No tracheal deviation.  Cardiovascular:     Rate and Rhythm: Normal rate and regular rhythm.     Heart sounds: Normal heart sounds.  Pulmonary:     Effort: No respiratory distress.     Breath sounds: No stridor. No wheezing.  Abdominal:     General: Bowel sounds are normal. There is no distension.     Palpations: Abdomen is soft. There is no mass.     Tenderness: There is no abdominal tenderness. There is no guarding or rebound.  Musculoskeletal:        General: Tenderness present.  Lymphadenopathy:     Cervical: No cervical adenopathy.  Skin:    Findings: No erythema or rash.  Neurological:     Cranial Nerves: No cranial nerve deficit.     Motor: No abnormal muscle tone.     Coordination: Coordination normal.     Deep  Tendon Reflexes: Reflexes normal.  Psychiatric:        Behavior: Behavior normal.        Thought Content: Thought content normal.        Judgment: Judgment normal.    Pain LS spine w/ROM   Lab Results  Component Value Date   WBC 5.5 07/21/2016   HGB 13.6 07/21/2016   HCT 39.7 07/21/2016   PLT 241.0 07/21/2016   GLUCOSE 93 05/06/2017   CHOL 214 (H) 07/21/2016   TRIG 115.0 07/21/2016   HDL 107.40 07/21/2016   LDLCALC 83 07/21/2016   ALT 12 07/21/2016   AST 21 07/21/2016   NA 138 05/06/2017   K 3.9 05/06/2017   CL 103 05/06/2017   CREATININE 0.78 05/06/2017   BUN 17 05/06/2017   CO2 32 05/06/2017   TSH 4.76 (H) 11/13/2017    Ct Cardiac Scoring  Addendum Date: 10/19/2018   ADDENDUM REPORT: 10/19/2018 16:45 CLINICAL DATA:  Risk stratification EXAM: Coronary Calcium Score TECHNIQUE: The patient was scanned on a [employer] scanner. Axial non-contrast 3 mm slices were carried out through the heart. The data set was analyzed on a dedicated work station and scored using the [place]. FINDINGS: Non-cardiac: See separate report from [facility]. Ascending Aorta: Normal size, no calcifications. Pericardium: Normal. Coronary arteries: Normal origin. IMPRESSION: Coronary calcium score of 3. This was 84 percentile for age and sex matched control. Electronically Signed   By: Ena Dawley   On: 10/19/2018 16:45   Result Date: 10/19/2018 EXAM: OVER-READ INTERPRETATION  CT CHEST The following report is an over-read performed by radiologist Dr. Rolm Baptise of [facility], [place] on 10/19/2018. This over-read does not include interpretation of cardiac or coronary anatomy or pathology. The coronary calcium score interpretation by the cardiologist is attached. COMPARISON:  PET CT 10/27/2005 FINDINGS: Vascular: Heart is normal size.  Aorta is normal caliber. Mediastinum/Nodes: No adenopathy in the lower mediastinum or hila. Lungs/Pleura: 13 mm nodule in the lingula, increased  slightly in size 2007 when this measured 10 mm. Linear scarring in the lung bases. No effusions. Calcified granuloma in the left lower lobe. Upper Abdomen: Imaging into the upper abdomen shows no acute findings. Musculoskeletal: Chest wall soft tissues are unremarkable. No acute bony abnormality. IMPRESSION: 13 mm nodule in the lingula, slightly increased in size from 10 mm in 2007. This was shown on prior PET CT to be most compatible with a benign nodule. No acute extra cardiac abnormality. Electronically Signed: By: Rolm Baptise M.D.  On: 10/19/2018 16:10    Assessment & Plan:   Diagnoses and all orders for this visit:  Chronic right-sided low back pain with sciatica, sciatica laterality unspecified -     Urinalysis; Future -     CBC with Differential/Platelet; Future  Coronary atherosclerosis due to lipid rich plaque -     Urinalysis; Future -     CBC with Differential/Platelet; Future  Postoperative hypothyroidism -     TSH; Future -     Urinalysis; Future -     CBC with Differential/Platelet; Future -     Lipid panel; Future -     Hepatic function panel; Future  Osteopenia, unspecified location -     Urinalysis; Future  Loss of weight -     TSH; Future -     Urinalysis; Future -     CBC with Differential/Platelet; Future -     Lipid panel; Future -     Basic metabolic panel; Future -     Hepatic function panel; Future  Hypothyroidism due to acquired atrophy of thyroid -     Discontinue: oxyCODONE (OXYCONTIN) 40 mg 12 hr tablet; Take 1 tablet (40 mg total) by mouth every 6 (six) hours. Please fill on or after 11/22/18. Dx: M54.5 and G89.4 -     Discontinue: oxyCODONE (OXYCONTIN) 80 mg 12 hr tablet; Take 1 tablet (80 mg total) by mouth every 6 (six) hours. Please fill on or after 11/22/18. Dx: M54.5 and G89.4 -     oxyCODONE (OXYCONTIN) 40 mg 12 hr tablet; Take 1 tablet (40 mg total) by mouth every 6 (six) hours. Please fill on or after 01/22/19. Dx: M54.5 and G89.4 -     oxyCODONE  (OXYCONTIN) 80 mg 12 hr tablet; Take 1 tablet (80 mg total) by mouth every 6 (six) hours. Please fill on or after 01/22/19. Dx: M54.5 and G89.4 -     oxyCODONE (OXYCONTIN) 40 mg 12 hr tablet; Take 1 tablet (40 mg total) by mouth every 6 (six) hours. Please fill on or after 12/23/18. Dx: M54.5 and G89.4 -     oxyCODONE (OXYCONTIN) 80 mg 12 hr tablet; Take 1 tablet (80 mg total) by mouth every 6 (six) hours. Please fill on or after 12/23/18. Dx: M54.5 and G89.4 -     TSH; Future -     Urinalysis; Future -     CBC with Differential/Platelet; Future -     Lipid panel; Future -     Basic metabolic panel; Future -     Hepatic function panel; Future  Other orders -     Red Yeast Rice 600 MG CAPS; 1 po qd     Meds ordered this encounter  Medications  . Red Yeast Rice 600 MG CAPS    Sig: 1 po qd    Dispense:  100 capsule    Refill:  3  . DISCONTD: oxyCODONE (OXYCONTIN) 40 mg 12 hr tablet    Sig: Take 1 tablet (40 mg total) by mouth every 6 (six) hours. Please fill on or after 11/22/18. Dx: M54.5 and G89.4    Dispense:  120 tablet    Refill:  0  . DISCONTD: oxyCODONE (OXYCONTIN) 80 mg 12 hr tablet    Sig: Take 1 tablet (80 mg total) by mouth every 6 (six) hours. Please fill on or after 11/22/18. Dx: M54.5 and G89.4    Dispense:  120 tablet    Refill:  0  . oxyCODONE (OXYCONTIN) 40 mg 12  hr tablet    Sig: Take 1 tablet (40 mg total) by mouth every 6 (six) hours. Please fill on or after 01/22/19. Dx: M54.5 and G89.4    Dispense:  120 tablet    Refill:  0  . oxyCODONE (OXYCONTIN) 80 mg 12 hr tablet    Sig: Take 1 tablet (80 mg total) by mouth every 6 (six) hours. Please fill on or after 01/22/19. Dx: M54.5 and G89.4    Dispense:  120 tablet    Refill:  0  . oxyCODONE (OXYCONTIN) 40 mg 12 hr tablet    Sig: Take 1 tablet (40 mg total) by mouth every 6 (six) hours. Please fill on or after 12/23/18. Dx: M54.5 and G89.4    Dispense:  120 tablet    Refill:  0  . oxyCODONE (OXYCONTIN) 80 mg 12 hr tablet     Sig: Take 1 tablet (80 mg total) by mouth every 6 (six) hours. Please fill on or after 12/23/18. Dx: M54.5 and G89.4    Dispense:  120 tablet    Refill:  0     Follow-up: Return in about 3 months (around 02/09/2019) for a follow-up visit.  Walker Kehr, MD

## 2018-11-11 NOTE — Assessment & Plan Note (Signed)
Very mild: Coronary calcium score of 3. This was 49 percentile for age and sex matched control. Discussed statins, ASA

## 2018-11-11 NOTE — Assessment & Plan Note (Signed)
Wt Readings from Last 3 Encounters:  11/11/18 127 lb (57.6 kg)  08/10/18 119 lb (54 kg)  05/07/18 120 lb (54.4 kg)

## 2018-12-17 ENCOUNTER — Other Ambulatory Visit: Payer: Self-pay | Admitting: Internal Medicine

## 2018-12-24 ENCOUNTER — Ambulatory Visit: Payer: Self-pay | Admitting: *Deleted

## 2018-12-24 NOTE — Telephone Encounter (Signed)
Message from [place] sent at 12/24/2018 11:49 AM EDT   Summary: tick bites    Pt was bit by tick 2 days ago. It has a scab. It has redness around it and it is warm to touch Area is about 3 inches by Inches. Pt would like to know what to next.  cb is [phone]        Returned call to pt who states that about 2 days ago she removed a tick about 1/2 the size of an eraser from the bend of her right knee. Pt sates that sine that time the area beneath the site is red, ward to touch and feels hard underneath. Pt states it looks like it may be cellulitis. Pt states that the area of redness goes 3-4 inches from side to side and about 6 inches beneath the area of the bite. Pt does not report other symptoms a this time. Pt advised to seek treatment at Urgent Care for evaluation of area. Pt verbalized understanding.  Reason for Disposition . [1] Red or very tender (to touch) area AND [2] started over 24 hours after the bite  Answer Assessment - Initial Assessment Questions 1. TYPE of TICK: "Is it a wood tick or a deer tick?" If unsure, ask: "What size was the tick?" "Did it look more like a watermelon seed or a poppy seed?"      It was smaller than a watermelon seed, about half the size of pencil eraser 2. LOCATION: "Where is the tick bite located?"      In the bend of the right knee 3. ONSET: "How long do you think the tick was attached before you removed it?" (Hours or days)      A couple of hours, pt states she was working in the yard, came back in the house, examined herself and took a shower. Pt states she did not notice the tick until later on during the night 4. TETANUS: "When was the last tetanus booster?"      It's up to date in the last 2-3 years  Protocols used: TICK BITE-A-AH

## 2018-12-28 NOTE — Telephone Encounter (Signed)
Pt did not see treatment at UC.

## 2019-02-09 ENCOUNTER — Encounter: Payer: Self-pay | Admitting: Internal Medicine

## 2019-02-09 ENCOUNTER — Ambulatory Visit (INDEPENDENT_AMBULATORY_CARE_PROVIDER_SITE_OTHER): Payer: Medicare Other | Admitting: Internal Medicine

## 2019-02-09 DIAGNOSIS — E890 Postprocedural hypothyroidism: Secondary | ICD-10-CM | POA: Diagnosis not present

## 2019-02-09 DIAGNOSIS — E034 Atrophy of thyroid (acquired): Secondary | ICD-10-CM

## 2019-02-09 DIAGNOSIS — M5440 Lumbago with sciatica, unspecified side: Secondary | ICD-10-CM

## 2019-02-09 DIAGNOSIS — I2510 Atherosclerotic heart disease of native coronary artery without angina pectoris: Secondary | ICD-10-CM | POA: Diagnosis not present

## 2019-02-09 DIAGNOSIS — I2583 Coronary atherosclerosis due to lipid rich plaque: Secondary | ICD-10-CM

## 2019-02-09 DIAGNOSIS — G8929 Other chronic pain: Secondary | ICD-10-CM | POA: Diagnosis not present

## 2019-02-09 MED ORDER — OXYCODONE HCL ER 40 MG PO T12A: 40.0000 mg | EXTENDED_RELEASE_TABLET | Freq: Four times a day (QID) | ORAL | 0 refills | Status: DC

## 2019-02-09 MED ORDER — OXYCODONE HCL ER 80 MG PO T12A: 80.0000 mg | EXTENDED_RELEASE_TABLET | Freq: Four times a day (QID) | ORAL | 0 refills | Status: DC

## 2019-02-09 NOTE — Assessment & Plan Note (Signed)
Continue with Levothroid

## 2019-02-09 NOTE — Assessment & Plan Note (Signed)
Aspirin, red rice yeast

## 2019-02-09 NOTE — Assessment & Plan Note (Signed)
Chronic Oxycodone/Oxycontin Rx - long term  Potential benefits of a long term opioids use as well as potential risks (i.e. addiction risk, apnea etc) and complications (i.e. Somnolence, constipation and others) were explained to the patient and were aknowledged. 

## 2019-02-09 NOTE — Progress Notes (Signed)
Virtual Visit via Telephone Note  I connected with Abigail Wilson on 02/09/19 at  4:00 PM EDT by telephone and verified that I am speaking with the correct person using two identifiers.   I discussed the limitations, risks, security and privacy concerns of performing an evaluation and management service by telephone and the availability of in person appointments. I also discussed with the patient that there may be a patient responsible charge related to this service. The patient expressed understanding and agreed to proceed.   History of Present Illness:   Follow-up on chronic low back pain, hypothyroidism Abigail Wilson's husband Abigail Wilson was exposed to a coworker with COVID-19 last week.  He was informed of the contact on Sunday.  Abigail Wilson and Abigail Wilson are asymptomatic.  Abigail Wilson can get tested at the health department.  Call me if symptoms Observations/Objective:  [name] sounds normal on the phone Assessment and Plan:  See plan Follow Up Instructions:    I discussed the assessment and treatment plan with the patient. The patient was provided an opportunity to ask questions and all were answered. The patient agreed with the plan and demonstrated an understanding of the instructions.   The patient was advised to call back or seek an in-person evaluation if the symptoms worsen or if the condition fails to improve as anticipated.  I provided 21 minutes of non-face-to-face time during this encounter.   Walker Kehr, MD

## 2019-03-25 ENCOUNTER — Other Ambulatory Visit: Payer: Self-pay | Admitting: Internal Medicine

## 2019-04-20 ENCOUNTER — Telehealth: Payer: Self-pay | Admitting: Internal Medicine

## 2019-04-20 DIAGNOSIS — E034 Atrophy of thyroid (acquired): Secondary | ICD-10-CM

## 2019-04-20 NOTE — Telephone Encounter (Signed)
Medication Refill - Medication:  oxyCODONE (OXYCONTIN) 80 mg 12 hr tablet  oxyCODONE (OXYCONTIN) 40 mg 12 hr tablet  Has the patient contacted their pharmacy?yes advised to call PCP  Preferred Pharmacy (with phone number or street name):  [id number] - [place], [place] - [address] [phone] (Phone) [phone] (Fax)   Agent: Please be advised that RX refills may take up to 3 business days. We ask that you follow-up with your pharmacy.

## 2019-04-20 NOTE — Telephone Encounter (Signed)
Control database checked last refill: 03/24/2019 120 tabs for both doses  LOV:02/09/2019 XLE:[id number]

## 2019-04-21 MED ORDER — OXYCODONE HCL ER 40 MG PO T12A: 40.0000 mg | EXTENDED_RELEASE_TABLET | Freq: Four times a day (QID) | ORAL | 0 refills | Status: DC

## 2019-04-21 MED ORDER — OXYCODONE HCL ER 80 MG PO T12A: 80.0000 mg | EXTENDED_RELEASE_TABLET | Freq: Four times a day (QID) | ORAL | 0 refills | Status: DC

## 2019-04-21 NOTE — Telephone Encounter (Signed)
Can you make patient an appointment. Please and thank you

## 2019-04-21 NOTE — Telephone Encounter (Signed)
Scheduled

## 2019-04-21 NOTE — Telephone Encounter (Signed)
Done  OV q 3 months Thx

## 2019-05-16 ENCOUNTER — Encounter: Payer: Self-pay | Admitting: Internal Medicine

## 2019-05-16 ENCOUNTER — Ambulatory Visit (INDEPENDENT_AMBULATORY_CARE_PROVIDER_SITE_OTHER): Payer: Medicare Other | Admitting: Internal Medicine

## 2019-05-16 ENCOUNTER — Other Ambulatory Visit: Payer: Self-pay

## 2019-05-16 VITALS — BP 118/70 | HR 79 | Temp 98.6°F | Ht 65.0 in | Wt 127.0 lb

## 2019-05-16 DIAGNOSIS — I2583 Coronary atherosclerosis due to lipid rich plaque: Secondary | ICD-10-CM

## 2019-05-16 DIAGNOSIS — I2510 Atherosclerotic heart disease of native coronary artery without angina pectoris: Secondary | ICD-10-CM | POA: Diagnosis not present

## 2019-05-16 DIAGNOSIS — E034 Atrophy of thyroid (acquired): Secondary | ICD-10-CM

## 2019-05-16 DIAGNOSIS — Z23 Encounter for immunization: Secondary | ICD-10-CM | POA: Diagnosis not present

## 2019-05-16 MED ORDER — OXYCODONE HCL ER 40 MG PO T12A: 40.0000 mg | EXTENDED_RELEASE_TABLET | Freq: Four times a day (QID) | ORAL | 0 refills | Status: DC

## 2019-05-16 MED ORDER — OXYCODONE HCL ER 80 MG PO T12A: 80.0000 mg | EXTENDED_RELEASE_TABLET | Freq: Four times a day (QID) | ORAL | 0 refills | Status: DC

## 2019-05-16 NOTE — Assessment & Plan Note (Signed)
Aspirin, red rice yeast extract

## 2019-05-16 NOTE — Progress Notes (Signed)
Subjective:  Patient ID: Abigail Wilson, female    DOB: Jun 09, 1961  Age: 58 y.o. MRN: [id number]  CC: No chief complaint on file.   HPI Abigail Wilson presents for chronic pain syndrome, hypothyroidism, atherosclerosis  Outpatient Medications Prior to Visit  Medication Sig Dispense Refill  . albuterol (PROVENTIL HFA;VENTOLIN HFA) 108 (90 Base) MCG/ACT inhaler Inhale 1-2 puffs into the lungs every 6 (six) hours as needed for wheezing or shortness of breath. 8.5 each 1  . aspirin 81 MG EC tablet Take 81 mg by mouth daily.      . Cholecalciferol 3000 units TABS Take 2 tablets by mouth daily.    [name] COENZYME Q-10 PO Take 200 mg by mouth daily.     . fluticasone (FLONASE) 50 MCG/ACT nasal spray Place 2 sprays into both nostrils daily. 16 g 11  . furosemide (LASIX) 40 MG tablet TAKE 1 TABLET BY MOUTH EVERY DAY 90 tablet 3  . KLOR-CON M20 20 MEQ tablet TAKE 1 TABLET BY MOUTH EVERY DAY 90 tablet 3  . levothyroxine (SYNTHROID, LEVOTHROID) 88 MCG tablet TAKE 1 TABLET BY MOUTH EVERY DAY 90 tablet 3  . oxyCODONE (OXYCONTIN) 40 mg 12 hr tablet Take 1 tablet (40 mg total) by mouth every 6 (six) hours. 120 tablet 0  . oxyCODONE (OXYCONTIN) 40 mg 12 hr tablet Take 1 tablet (40 mg total) by mouth every 6 (six) hours. 120 tablet 0  . oxyCODONE (OXYCONTIN) 80 mg 12 hr tablet Take 1 tablet (80 mg total) by mouth every 6 (six) hours. 120 tablet 0  . oxyCODONE (OXYCONTIN) 80 mg 12 hr tablet Take 1 tablet (80 mg total) by mouth every 6 (six) hours. 120 tablet 0  . Red Yeast Rice 600 MG CAPS 1 po qd 100 capsule 3   No facility-administered medications prior to visit.     ROS: Review of Systems  Constitutional: Negative for activity change, appetite change, chills, fatigue and unexpected weight change.  HENT: Negative for congestion, mouth sores and sinus pressure.   Eyes: Negative for visual disturbance.  Respiratory: Negative for cough and chest tightness.   Gastrointestinal: Negative for abdominal  pain and nausea.  Genitourinary: Negative for difficulty urinating, frequency and vaginal pain.  Musculoskeletal: Positive for back pain. Negative for gait problem.  Skin: Negative for pallor and rash.  Neurological: Negative for dizziness, tremors, weakness, numbness and headaches.  Psychiatric/Behavioral: Negative for confusion, sleep disturbance and suicidal ideas.    Objective:  BP 118/70 (BP Location: Left Arm, Patient Position: Sitting, Cuff Size: Normal)   Pulse 79   Temp 98.6 F (37 C) (Oral)   Ht 5\' 5"  (1.651 m)   Wt 127 lb (57.6 kg)   SpO2 97%   BMI 21.13 kg/m   BP Readings from Last 3 Encounters:  05/16/19 118/70  11/11/18 118/76  08/10/18 118/70    Wt Readings from Last 3 Encounters:  05/16/19 127 lb (57.6 kg)  11/11/18 127 lb (57.6 kg)  08/10/18 119 lb (54 kg)    Physical Exam Constitutional:      General: She is not in acute distress.    Appearance: She is well-developed.  HENT:     Head: Normocephalic.     Right Ear: External ear normal.     Left Ear: External ear normal.     Nose: Nose normal.  Eyes:     General:        Right eye: No discharge.        Left  eye: No discharge.     Conjunctiva/sclera: Conjunctivae normal.     Pupils: Pupils are equal, round, and reactive to light.  Neck:     Musculoskeletal: Normal range of motion and neck supple.     Thyroid: No thyromegaly.     Vascular: No JVD.     Trachea: No tracheal deviation.  Cardiovascular:     Rate and Rhythm: Normal rate and regular rhythm.     Heart sounds: Normal heart sounds.  Pulmonary:     Effort: No respiratory distress.     Breath sounds: No stridor. No wheezing.  Abdominal:     General: Bowel sounds are normal. There is no distension.     Palpations: Abdomen is soft. There is no mass.     Tenderness: There is no abdominal tenderness. There is no guarding or rebound.  Musculoskeletal:        General: Tenderness present.  Lymphadenopathy:     Cervical: No cervical  adenopathy.  Skin:    Findings: No erythema or rash.  Neurological:     Cranial Nerves: No cranial nerve deficit.     Motor: No abnormal muscle tone.     Coordination: Coordination normal.     Deep Tendon Reflexes: Reflexes normal.  Psychiatric:        Behavior: Behavior normal.        Thought Content: Thought content normal.        Judgment: Judgment normal.    LS tender   Lab Results  Component Value Date   WBC 5.5 07/21/2016   HGB 13.6 07/21/2016   HCT 39.7 07/21/2016   PLT 241.0 07/21/2016   GLUCOSE 93 05/06/2017   CHOL 214 (H) 07/21/2016   TRIG 115.0 07/21/2016   HDL 107.40 07/21/2016   LDLCALC 83 07/21/2016   ALT 12 07/21/2016   AST 21 07/21/2016   NA 138 05/06/2017   K 3.9 05/06/2017   CL 103 05/06/2017   CREATININE 0.78 05/06/2017   BUN 17 05/06/2017   CO2 32 05/06/2017   TSH 4.76 (H) 11/13/2017    Ct Cardiac Scoring  Addendum Date: 10/19/2018   ADDENDUM REPORT: 10/19/2018 16:45 CLINICAL DATA:  Risk stratification EXAM: Coronary Calcium Score TECHNIQUE: The patient was scanned on a [employer] scanner. Axial non-contrast 3 mm slices were carried out through the heart. The data set was analyzed on a dedicated work station and scored using the [place]. FINDINGS: Non-cardiac: See separate report from [facility]. Ascending Aorta: Normal size, no calcifications. Pericardium: Normal. Coronary arteries: Normal origin. IMPRESSION: Coronary calcium score of 3. This was 92 percentile for age and sex matched control. Electronically Signed   By: Ena Dawley   On: 10/19/2018 16:45   Result Date: 10/19/2018 EXAM: OVER-READ INTERPRETATION  CT CHEST The following report is an over-read performed by radiologist Dr. Rolm Baptise of [facility], [place] on 10/19/2018. This over-read does not include interpretation of cardiac or coronary anatomy or pathology. The coronary calcium score interpretation by the cardiologist is attached. COMPARISON:  PET CT 10/27/2005  FINDINGS: Vascular: Heart is normal size.  Aorta is normal caliber. Mediastinum/Nodes: No adenopathy in the lower mediastinum or hila. Lungs/Pleura: 13 mm nodule in the lingula, increased slightly in size 2007 when this measured 10 mm. Linear scarring in the lung bases. No effusions. Calcified granuloma in the left lower lobe. Upper Abdomen: Imaging into the upper abdomen shows no acute findings. Musculoskeletal: Chest wall soft tissues are unremarkable. No acute bony abnormality. IMPRESSION: 13 mm  nodule in the lingula, slightly increased in size from 10 mm in 2007. This was shown on prior PET CT to be most compatible with a benign nodule. No acute extra cardiac abnormality. Electronically Signed: By: Rolm Baptise M.D. On: 10/19/2018 16:10    Assessment & Plan:   Diagnoses and all orders for this visit:  Need for influenza vaccination -     Flu Vaccine QUAD 36+ mos IM  Hypothyroidism due to acquired atrophy of thyroid     No orders of the defined types were placed in this encounter.    Follow-up: No follow-ups on file.  Walker Kehr, MD

## 2019-05-17 MED ORDER — OXYCODONE HCL ER 80 MG PO T12A: 80.0000 mg | EXTENDED_RELEASE_TABLET | Freq: Four times a day (QID) | ORAL | 0 refills | Status: DC

## 2019-05-17 NOTE — Addendum Note (Signed)
Addended by: Cassandria Anger on: 05/17/2019 05:07 PM   Modules accepted: Orders

## 2019-06-22 ENCOUNTER — Telehealth: Payer: Self-pay | Admitting: Internal Medicine

## 2019-06-22 DIAGNOSIS — E034 Atrophy of thyroid (acquired): Secondary | ICD-10-CM

## 2019-06-22 NOTE — Telephone Encounter (Signed)
Pharmacy has not received Rx for oxyCODONE (OXYCONTIN) 40 mg 12 hr tablet They have received 2 Rx for the 80mg  Oxycodone/ please advise

## 2019-06-24 MED ORDER — OXYCODONE HCL ER 40 MG PO T12A: 40.0000 mg | EXTENDED_RELEASE_TABLET | Freq: Four times a day (QID) | ORAL | 0 refills | Status: DC

## 2019-06-24 NOTE — Telephone Encounter (Signed)
rx sent by PCP this AM

## 2019-06-24 NOTE — Telephone Encounter (Signed)
Pt called to f/up on this request.  Pt states she is completely out of medication. Please advise.

## 2019-08-15 ENCOUNTER — Ambulatory Visit (INDEPENDENT_AMBULATORY_CARE_PROVIDER_SITE_OTHER): Payer: Medicare Other | Admitting: Internal Medicine

## 2019-08-15 ENCOUNTER — Other Ambulatory Visit: Payer: Self-pay

## 2019-08-15 ENCOUNTER — Encounter: Payer: Self-pay | Admitting: Internal Medicine

## 2019-08-15 DIAGNOSIS — E034 Atrophy of thyroid (acquired): Secondary | ICD-10-CM | POA: Diagnosis not present

## 2019-08-15 MED ORDER — OXYCODONE HCL ER 40 MG PO T12A: 40.0000 mg | EXTENDED_RELEASE_TABLET | Freq: Four times a day (QID) | ORAL | 0 refills | Status: DC

## 2019-08-15 MED ORDER — OXYCODONE HCL ER 80 MG PO T12A: 80.0000 mg | EXTENDED_RELEASE_TABLET | Freq: Four times a day (QID) | ORAL | 0 refills | Status: DC

## 2019-08-15 NOTE — Assessment & Plan Note (Signed)
Levothroid 

## 2019-08-15 NOTE — Progress Notes (Signed)
Subjective:  Patient ID: Abigail Wilson, female    DOB: 10/11/1960  Age: 58 y.o. MRN: [id number]  CC: No chief complaint on file.   HPI Abigail Wilson presents for LBP, hypothyroidism  Outpatient Medications Prior to Visit  Medication Sig Dispense Refill  . albuterol (PROVENTIL HFA;VENTOLIN HFA) 108 (90 Base) MCG/ACT inhaler Inhale 1-2 puffs into the lungs every 6 (six) hours as needed for wheezing or shortness of breath. 8.5 each 1  . aspirin 81 MG EC tablet Take 81 mg by mouth daily.      . Cholecalciferol 3000 units TABS Take 2 tablets by mouth daily.    [name] COENZYME Q-10 PO Take 200 mg by mouth daily.     . fluticasone (FLONASE) 50 MCG/ACT nasal spray Place 2 sprays into both nostrils daily. 16 g 11  . furosemide (LASIX) 40 MG tablet TAKE 1 TABLET BY MOUTH EVERY DAY 90 tablet 3  . KLOR-CON M20 20 MEQ tablet TAKE 1 TABLET BY MOUTH EVERY DAY 90 tablet 3  . levothyroxine (SYNTHROID, LEVOTHROID) 88 MCG tablet TAKE 1 TABLET BY MOUTH EVERY DAY 90 tablet 3  . oxyCODONE (OXYCONTIN) 40 mg 12 hr tablet Take 1 tablet (40 mg total) by mouth every 6 (six) hours. 120 tablet 0  . oxyCODONE (OXYCONTIN) 40 mg 12 hr tablet Take 1 tablet (40 mg total) by mouth every 6 (six) hours. 120 tablet 0  . oxyCODONE (OXYCONTIN) 80 mg 12 hr tablet Take 1 tablet (80 mg total) by mouth every 6 (six) hours. 120 tablet 0  . oxyCODONE (OXYCONTIN) 80 mg 12 hr tablet Take 1 tablet (80 mg total) by mouth every 6 (six) hours. 120 tablet 0  . oxyCODONE (OXYCONTIN) 80 mg 12 hr tablet Take 1 tablet (80 mg total) by mouth every 6 (six) hours. 120 tablet 0  . Red Yeast Rice 600 MG CAPS 1 po qd 100 capsule 3   No facility-administered medications prior to visit.     ROS: Review of Systems  Constitutional: Negative for activity change, appetite change, chills, fatigue and unexpected weight change.  HENT: Negative for congestion, mouth sores and sinus pressure.   Eyes: Negative for visual disturbance.  Respiratory:  Negative for cough and chest tightness.   Gastrointestinal: Negative for abdominal pain and nausea.  Genitourinary: Negative for difficulty urinating, frequency and vaginal pain.  Musculoskeletal: Positive for back pain. Negative for gait problem.  Skin: Negative for pallor and rash.  Neurological: Negative for dizziness, tremors, weakness, numbness and headaches.  Psychiatric/Behavioral: Negative for confusion and sleep disturbance.    Objective:  BP 116/72 (BP Location: Left Arm, Patient Position: Sitting, Cuff Size: Normal)   Pulse 65   Temp 98 F (36.7 C) (Oral)   Ht 5\' 5"  (1.651 m)   Wt 127 lb (57.6 kg)   SpO2 97%   BMI 21.13 kg/m   BP Readings from Last 3 Encounters:  08/15/19 116/72  05/16/19 118/70  11/11/18 118/76    Wt Readings from Last 3 Encounters:  08/15/19 127 lb (57.6 kg)  05/16/19 127 lb (57.6 kg)  11/11/18 127 lb (57.6 kg)    Physical Exam Constitutional:      General: She is not in acute distress.    Appearance: She is well-developed.  HENT:     Head: Normocephalic.     Right Ear: External ear normal.     Left Ear: External ear normal.     Nose: Nose normal.  Eyes:     General:  Right eye: No discharge.        Left eye: No discharge.     Conjunctiva/sclera: Conjunctivae normal.     Pupils: Pupils are equal, round, and reactive to light.  Neck:     Musculoskeletal: Normal range of motion and neck supple.     Thyroid: No thyromegaly.     Vascular: No JVD.     Trachea: No tracheal deviation.  Cardiovascular:     Rate and Rhythm: Normal rate and regular rhythm.     Heart sounds: Normal heart sounds.  Pulmonary:     Effort: No respiratory distress.     Breath sounds: No stridor. No wheezing.  Abdominal:     General: Bowel sounds are normal. There is no distension.     Palpations: Abdomen is soft. There is no mass.     Tenderness: There is no abdominal tenderness. There is no guarding or rebound.  Musculoskeletal:        General:  Tenderness present.  Lymphadenopathy:     Cervical: No cervical adenopathy.  Skin:    Findings: No erythema or rash.  Neurological:     Mental Status: She is oriented to person, place, and time.     Cranial Nerves: No cranial nerve deficit.     Motor: No abnormal muscle tone.     Coordination: Coordination normal.     Deep Tendon Reflexes: Reflexes normal.  Psychiatric:        Behavior: Behavior normal.        Thought Content: Thought content normal.        Judgment: Judgment normal.    LS tender   Lab Results  Component Value Date   WBC 5.5 07/21/2016   HGB 13.6 07/21/2016   HCT 39.7 07/21/2016   PLT 241.0 07/21/2016   GLUCOSE 93 05/06/2017   CHOL 214 (H) 07/21/2016   TRIG 115.0 07/21/2016   HDL 107.40 07/21/2016   LDLCALC 83 07/21/2016   ALT 12 07/21/2016   AST 21 07/21/2016   NA 138 05/06/2017   K 3.9 05/06/2017   CL 103 05/06/2017   CREATININE 0.78 05/06/2017   BUN 17 05/06/2017   CO2 32 05/06/2017   TSH 4.76 (H) 11/13/2017    Ct Cardiac Scoring  Addendum Date: 10/19/2018   ADDENDUM REPORT: 10/19/2018 16:45 CLINICAL DATA:  Risk stratification EXAM: Coronary Calcium Score TECHNIQUE: The patient was scanned on a [employer] scanner. Axial non-contrast 3 mm slices were carried out through the heart. The data set was analyzed on a dedicated work station and scored using the [place]. FINDINGS: Non-cardiac: See separate report from [facility]. Ascending Aorta: Normal size, no calcifications. Pericardium: Normal. Coronary arteries: Normal origin. IMPRESSION: Coronary calcium score of 3. This was 67 percentile for age and sex matched control. Electronically Signed   By: Ena Dawley   On: 10/19/2018 16:45   Result Date: 10/19/2018 EXAM: OVER-READ INTERPRETATION  CT CHEST The following report is an over-read performed by radiologist Dr. Rolm Baptise of [facility], [place] on 10/19/2018. This over-read does not include interpretation of cardiac or  coronary anatomy or pathology. The coronary calcium score interpretation by the cardiologist is attached. COMPARISON:  PET CT 10/27/2005 FINDINGS: Vascular: Heart is normal size.  Aorta is normal caliber. Mediastinum/Nodes: No adenopathy in the lower mediastinum or hila. Lungs/Pleura: 13 mm nodule in the lingula, increased slightly in size 2007 when this measured 10 mm. Linear scarring in the lung bases. No effusions. Calcified granuloma in the left lower  lobe. Upper Abdomen: Imaging into the upper abdomen shows no acute findings. Musculoskeletal: Chest wall soft tissues are unremarkable. No acute bony abnormality. IMPRESSION: 13 mm nodule in the lingula, slightly increased in size from 10 mm in 2007. This was shown on prior PET CT to be most compatible with a benign nodule. No acute extra cardiac abnormality. Electronically Signed: By: Rolm Baptise M.D. On: 10/19/2018 16:10    Assessment & Plan:   There are no diagnoses linked to this encounter.   No orders of the defined types were placed in this encounter.    Follow-up: No follow-ups on file.  Walker Kehr, MD

## 2019-10-07 ENCOUNTER — Other Ambulatory Visit: Payer: Self-pay | Admitting: Internal Medicine

## 2019-11-15 ENCOUNTER — Other Ambulatory Visit: Payer: Self-pay

## 2019-11-15 ENCOUNTER — Ambulatory Visit (INDEPENDENT_AMBULATORY_CARE_PROVIDER_SITE_OTHER): Payer: Medicare Other | Admitting: Internal Medicine

## 2019-11-15 ENCOUNTER — Encounter: Payer: Self-pay | Admitting: Internal Medicine

## 2019-11-15 DIAGNOSIS — E034 Atrophy of thyroid (acquired): Secondary | ICD-10-CM | POA: Diagnosis not present

## 2019-11-15 DIAGNOSIS — G8929 Other chronic pain: Secondary | ICD-10-CM | POA: Diagnosis not present

## 2019-11-15 DIAGNOSIS — M5440 Lumbago with sciatica, unspecified side: Secondary | ICD-10-CM | POA: Diagnosis not present

## 2019-11-15 DIAGNOSIS — I2583 Coronary atherosclerosis due to lipid rich plaque: Secondary | ICD-10-CM | POA: Diagnosis not present

## 2019-11-15 DIAGNOSIS — I2510 Atherosclerotic heart disease of native coronary artery without angina pectoris: Secondary | ICD-10-CM

## 2019-11-15 MED ORDER — OXYCODONE HCL ER 40 MG PO T12A: 40.0000 mg | EXTENDED_RELEASE_TABLET | Freq: Four times a day (QID) | ORAL | 0 refills | Status: DC

## 2019-11-15 MED ORDER — OXYCODONE HCL ER 80 MG PO T12A: 80.0000 mg | EXTENDED_RELEASE_TABLET | Freq: Four times a day (QID) | ORAL | 0 refills | Status: DC

## 2019-11-15 NOTE — Progress Notes (Signed)
Subjective:  Patient ID: Abigail Wilson, female    DOB: [birth year]  Age: 59 y.o. MRN: [id number]  CC: No chief complaint on file.   HPI RAMLA PEPER presents for LBP, hypothyroidism, dyslipidemia  Outpatient Medications Prior to Visit  Medication Sig Dispense Refill  . albuterol (PROVENTIL HFA;VENTOLIN HFA) 108 (90 Base) MCG/ACT inhaler Inhale 1-2 puffs into the lungs every 6 (six) hours as needed for wheezing or shortness of breath. 8.5 each 1  . aspirin 81 MG EC tablet Take 81 mg by mouth daily.      . Cholecalciferol 3000 units TABS Take 2 tablets by mouth daily.    [name] COENZYME Q-10 PO Take 200 mg by mouth daily.     . fluticasone (FLONASE) 50 MCG/ACT nasal spray Place 2 sprays into both nostrils daily. 16 g 11  . furosemide (LASIX) 40 MG tablet TAKE 1 TABLET BY MOUTH EVERY DAY 90 tablet 3  . KLOR-CON M20 20 MEQ tablet TAKE 1 TABLET BY MOUTH EVERY DAY 90 tablet 3  . levothyroxine (SYNTHROID) 88 MCG tablet TAKE 1 TABLET BY MOUTH EVERY DAY 90 tablet 3  . oxyCODONE (OXYCONTIN) 40 mg 12 hr tablet Take 1 tablet (40 mg total) by mouth every 6 (six) hours. 120 tablet 0  . oxyCODONE (OXYCONTIN) 40 mg 12 hr tablet Take 1 tablet (40 mg total) by mouth every 6 (six) hours. 120 tablet 0  . oxyCODONE (OXYCONTIN) 40 mg 12 hr tablet Take 1 tablet (40 mg total) by mouth every 6 (six) hours. Please fill on or after 10/21/19 120 tablet 0  . oxyCODONE (OXYCONTIN) 80 mg 12 hr tablet Take 1 tablet (80 mg total) by mouth every 6 (six) hours. 120 tablet 0  . oxyCODONE (OXYCONTIN) 80 mg 12 hr tablet Take 1 tablet (80 mg total) by mouth every 6 (six) hours. 120 tablet 0  . oxyCODONE (OXYCONTIN) 80 mg 12 hr tablet Take 1 tablet (80 mg total) by mouth every 6 (six) hours. 120 tablet 0  . Red Yeast Rice 600 MG CAPS 1 po qd 100 capsule 3   No facility-administered medications prior to visit.    ROS: Review of Systems  Constitutional: Positive for unexpected weight change. Negative for activity change,  appetite change, chills and fatigue.  HENT: Negative for congestion, mouth sores and sinus pressure.   Eyes: Negative for visual disturbance.  Respiratory: Negative for cough and chest tightness.   Gastrointestinal: Negative for abdominal pain and nausea.  Genitourinary: Negative for difficulty urinating, frequency and vaginal pain.  Musculoskeletal: Positive for back pain. Negative for gait problem.  Skin: Negative for pallor and rash.  Neurological: Negative for dizziness, tremors, weakness, numbness and headaches.  Psychiatric/Behavioral: Negative for confusion and sleep disturbance.    Objective:  BP 122/76 (BP Location: Left Arm, Patient Position: Sitting, Cuff Size: Normal)   Pulse 62   Temp 98.2 F (36.8 C) (Oral)   Ht 5\' 5"  (1.651 m)   Wt 131 lb (59.4 kg)   SpO2 98%   BMI 21.80 kg/m   BP Readings from Last 3 Encounters:  11/15/19 122/76  08/15/19 116/72  05/16/19 118/70    Wt Readings from Last 3 Encounters:  11/15/19 131 lb (59.4 kg)  08/15/19 127 lb (57.6 kg)  05/16/19 127 lb (57.6 kg)    Physical Exam Constitutional:      General: She is not in acute distress.    Appearance: She is well-developed.  HENT:     Head: Normocephalic.  Right Ear: External ear normal.     Left Ear: External ear normal.     Nose: Nose normal.  Eyes:     General:        Right eye: No discharge.        Left eye: No discharge.     Conjunctiva/sclera: Conjunctivae normal.     Pupils: Pupils are equal, round, and reactive to light.  Neck:     Thyroid: No thyromegaly.     Vascular: No JVD.     Trachea: No tracheal deviation.  Cardiovascular:     Rate and Rhythm: Normal rate and regular rhythm.     Heart sounds: Normal heart sounds.  Pulmonary:     Effort: No respiratory distress.     Breath sounds: No stridor. No wheezing.  Abdominal:     General: Bowel sounds are normal. There is no distension.     Palpations: Abdomen is soft. There is no mass.     Tenderness: There is  no abdominal tenderness. There is no guarding or rebound.  Musculoskeletal:        General: Tenderness present.     Cervical back: Normal range of motion and neck supple.  Lymphadenopathy:     Cervical: No cervical adenopathy.  Skin:    Findings: No erythema or rash.  Neurological:     Cranial Nerves: No cranial nerve deficit.     Motor: No abnormal muscle tone.     Coordination: Coordination normal.     Deep Tendon Reflexes: Reflexes normal.  Psychiatric:        Behavior: Behavior normal.        Thought Content: Thought content normal.        Judgment: Judgment normal.    LS spine w/pain   Lab Results  Component Value Date   WBC 5.5 07/21/2016   HGB 13.6 07/21/2016   HCT 39.7 07/21/2016   PLT 241.0 07/21/2016   GLUCOSE 93 05/06/2017   CHOL 214 (H) 07/21/2016   TRIG 115.0 07/21/2016   HDL 107.40 07/21/2016   LDLCALC 83 07/21/2016   ALT 12 07/21/2016   AST 21 07/21/2016   NA 138 05/06/2017   K 3.9 05/06/2017   CL 103 05/06/2017   CREATININE 0.78 05/06/2017   BUN 17 05/06/2017   CO2 32 05/06/2017   TSH 4.76 (H) 11/13/2017    CT CARDIAC SCORING  Addendum Date: 10/19/2018   ADDENDUM REPORT: 10/19/2018 16:45 CLINICAL DATA:  Risk stratification EXAM: Coronary Calcium Score TECHNIQUE: The patient was scanned on a [employer] scanner. Axial non-contrast 3 mm slices were carried out through the heart. The data set was analyzed on a dedicated work station and scored using the [place]. FINDINGS: Non-cardiac: See separate report from [facility]. Ascending Aorta: Normal size, no calcifications. Pericardium: Normal. Coronary arteries: Normal origin. IMPRESSION: Coronary calcium score of 3. This was 64 percentile for age and sex matched control. Electronically Signed   By: Ena Dawley   On: 10/19/2018 16:45   Result Date: 10/19/2018 EXAM: OVER-READ INTERPRETATION  CT CHEST The following report is an over-read performed by radiologist Dr. Rolm Baptise of  [facility], [place] on 10/19/2018. This over-read does not include interpretation of cardiac or coronary anatomy or pathology. The coronary calcium score interpretation by the cardiologist is attached. COMPARISON:  PET CT 10/27/2005 FINDINGS: Vascular: Heart is normal size.  Aorta is normal caliber. Mediastinum/Nodes: No adenopathy in the lower mediastinum or hila. Lungs/Pleura: 13 mm nodule in the lingula, increased  slightly in size 2007 when this measured 10 mm. Linear scarring in the lung bases. No effusions. Calcified granuloma in the left lower lobe. Upper Abdomen: Imaging into the upper abdomen shows no acute findings. Musculoskeletal: Chest wall soft tissues are unremarkable. No acute bony abnormality. IMPRESSION: 13 mm nodule in the lingula, slightly increased in size from 10 mm in 2007. This was shown on prior PET CT to be most compatible with a benign nodule. No acute extra cardiac abnormality. Electronically Signed: By: Rolm Baptise M.D. On: 10/19/2018 16:10    Assessment & Plan:     Follow-up: No follow-ups on file.  Walker Kehr, MD

## 2019-11-18 ENCOUNTER — Other Ambulatory Visit (INDEPENDENT_AMBULATORY_CARE_PROVIDER_SITE_OTHER): Payer: Medicare Other

## 2019-11-18 ENCOUNTER — Other Ambulatory Visit: Payer: Self-pay

## 2019-11-18 ENCOUNTER — Telehealth: Payer: Self-pay

## 2019-11-18 ENCOUNTER — Telehealth: Payer: Self-pay | Admitting: Internal Medicine

## 2019-11-18 DIAGNOSIS — E034 Atrophy of thyroid (acquired): Secondary | ICD-10-CM | POA: Diagnosis not present

## 2019-11-18 DIAGNOSIS — E785 Hyperlipidemia, unspecified: Secondary | ICD-10-CM

## 2019-11-18 DIAGNOSIS — R031 Nonspecific low blood-pressure reading: Secondary | ICD-10-CM | POA: Diagnosis not present

## 2019-11-18 DIAGNOSIS — Z79899 Other long term (current) drug therapy: Secondary | ICD-10-CM

## 2019-11-18 LAB — LIPID PANEL
Cholesterol: 204 mg/dL — ABNORMAL HIGH (ref 0–200)
HDL: 101.6 mg/dL (ref >39.00)
LDL Cholesterol: 91 mg/dL (ref 0–99)
NonHDL: 102
Total CHOL/HDL Ratio: 2
Triglycerides: 55 mg/dL (ref 0.0–149.0)
VLDL: 11 mg/dL (ref 0.0–40.0)

## 2019-11-18 LAB — CBC WITH DIFFERENTIAL/PLATELET
Basophils Absolute: 0.1 10*3/uL (ref 0.0–0.1)
Basophils Relative: 1 % (ref 0.0–3.0)
Eosinophils Absolute: 0.2 10*3/uL (ref 0.0–0.7)
Eosinophils Relative: 4 % (ref 0.0–5.0)
HCT: 38.7 % (ref 36.0–46.0)
Hemoglobin: 13.5 g/dL (ref 12.0–15.0)
Lymphocytes Relative: 33.5 % (ref 12.0–46.0)
Lymphs Abs: 1.8 10*3/uL (ref 0.7–4.0)
MCHC: 34.8 g/dL (ref 30.0–36.0)
MCV: 101.2 fl — ABNORMAL HIGH (ref 78.0–100.0)
Monocytes Absolute: 0.7 10*3/uL (ref 0.1–1.0)
Monocytes Relative: 12.9 % — ABNORMAL HIGH (ref 3.0–12.0)
Neutro Abs: 2.7 10*3/uL (ref 1.4–7.7)
Neutrophils Relative %: 48.6 % (ref 43.0–77.0)
Platelets: 238 10*3/uL (ref 150.0–400.0)
RBC: 3.83 Mil/uL — ABNORMAL LOW (ref 3.87–5.11)
RDW: 14.4 % (ref 11.5–15.5)
WBC: 5.5 10*3/uL (ref 4.0–10.5)

## 2019-11-18 LAB — BASIC METABOLIC PANEL
BUN: 14 mg/dL (ref 6–23)
CO2: 30 mEq/L (ref 19–32)
Calcium: 9.9 mg/dL (ref 8.4–10.5)
Chloride: 99 mEq/L (ref 96–112)
Creatinine, Ser: 0.83 mg/dL (ref 0.40–1.20)
GFR: 70.13 mL/min (ref >60.00)
Glucose, Bld: 95 mg/dL (ref 70–99)
Potassium: 4.5 mEq/L (ref 3.5–5.1)
Sodium: 136 mEq/L (ref 135–145)

## 2019-11-18 LAB — HEPATIC FUNCTION PANEL
ALT: 15 U/L (ref 0–35)
AST: 28 U/L (ref 0–37)
Albumin: 4.1 g/dL (ref 3.5–5.2)
Alkaline Phosphatase: 52 U/L (ref 39–117)
Bilirubin, Direct: 0.1 mg/dL (ref 0.0–0.3)
Total Bilirubin: 0.4 mg/dL (ref 0.2–1.2)
Total Protein: 6.7 g/dL (ref 6.0–8.3)

## 2019-11-18 LAB — TSH: TSH: 4.85 u[IU]/mL — ABNORMAL HIGH (ref 0.35–4.50)

## 2019-11-18 NOTE — Telephone Encounter (Signed)
New message   The patient calling was advise two medication needs prior authorization  oxyCODONE (OXYCONTIN) 40 mg 12 hr tablet oxyCODONE (OXYCONTIN) 80 mg 12 hr tablet  [facility] [place], [place]

## 2019-11-18 NOTE — Telephone Encounter (Signed)
F/u   [facility] calling checking on the status of prior authorization.   Urgency to speak with someone today

## 2019-11-18 NOTE — Telephone Encounter (Signed)
Error

## 2019-11-18 NOTE — Telephone Encounter (Signed)
New message:   Pt is calling to check on the status of her refill.

## 2019-11-21 ENCOUNTER — Other Ambulatory Visit: Payer: Self-pay | Admitting: Internal Medicine

## 2019-11-21 MED ORDER — LEVOTHYROXINE SODIUM 100 MCG PO TABS: 100.0000 ug | ORAL_TABLET | Freq: Every day | ORAL | 3 refills | Status: DC

## 2019-11-22 NOTE — Telephone Encounter (Signed)
Spoke to pt and she states this was taken care of and approved throughout the remainder of the year

## 2019-11-28 NOTE — Assessment & Plan Note (Signed)
Oxycodone/Oxycontin Rx - long term  Potential benefits of a long term opioids use as well as potential risks (i.e. addiction risk, apnea etc) and complications (i.e. Somnolence, constipation and others) were explained to the patient and were aknowledged.

## 2019-11-28 NOTE — Assessment & Plan Note (Signed)
Levothroid °Labs °

## 2019-12-12 ENCOUNTER — Other Ambulatory Visit: Payer: Self-pay | Admitting: Internal Medicine

## 2019-12-14 ENCOUNTER — Telehealth: Payer: Self-pay | Admitting: Internal Medicine

## 2019-12-14 NOTE — Telephone Encounter (Addendum)
Cathy from [place] called and said that they spoke with the patient saying they would no longer be filling her C2 medications any longer. They are due for a refill in a week and need to be filled at another pharmacy. Please call her back at your earliest convenience

## 2019-12-15 ENCOUNTER — Other Ambulatory Visit: Payer: Self-pay

## 2019-12-15 DIAGNOSIS — E034 Atrophy of thyroid (acquired): Secondary | ICD-10-CM

## 2019-12-15 MED ORDER — OXYCODONE HCL ER 80 MG PO T12A: 80.0000 mg | EXTENDED_RELEASE_TABLET | Freq: Four times a day (QID) | ORAL | 0 refills | Status: DC

## 2019-12-15 MED ORDER — OXYCODONE HCL ER 40 MG PO T12A: 40.0000 mg | EXTENDED_RELEASE_TABLET | Freq: Four times a day (QID) | ORAL | 0 refills | Status: DC

## 2019-12-15 NOTE — Telephone Encounter (Signed)
Spoke with pharmacy and they state they informed pt last month that they were no longer going to be filling her or her husbands controlled medications. States there are several reason why but the main is they think the dosage is to high.  LM for pt to call back

## 2020-01-02 ENCOUNTER — Other Ambulatory Visit: Payer: Self-pay

## 2020-01-03 ENCOUNTER — Ambulatory Visit (INDEPENDENT_AMBULATORY_CARE_PROVIDER_SITE_OTHER): Payer: Medicare Other | Admitting: Obstetrics & Gynecology

## 2020-01-03 ENCOUNTER — Encounter: Payer: Self-pay | Admitting: Obstetrics & Gynecology

## 2020-01-03 VITALS — BP 138/76 | Ht 64.5 in | Wt 125.0 lb

## 2020-01-03 DIAGNOSIS — Z01419 Encounter for gynecological examination (general) (routine) without abnormal findings: Secondary | ICD-10-CM

## 2020-01-03 DIAGNOSIS — M8580 Other specified disorders of bone density and structure, unspecified site: Secondary | ICD-10-CM

## 2020-01-03 DIAGNOSIS — Z9189 Other specified personal risk factors, not elsewhere classified: Secondary | ICD-10-CM

## 2020-01-03 DIAGNOSIS — Z780 Asymptomatic menopausal state: Secondary | ICD-10-CM

## 2020-01-03 NOTE — Progress Notes (Signed)
Abigail Wilson 12-Sep-1961 [id number]   History:    59 y.o. G4P2A2L2  Single.  Retired Marine scientist.  [employer]ages [employer].  RP:  Established patient presenting for annual gyn exam   HPI: Menopause, well without hormone replacement therapy.  No postmenopausal bleeding.  No pelvic pain.  Abstinent for the last 5 years.  Normal vaginal secretions.  Urine and bowel movements normal.  Breasts normal.  Walks regularly and goes to the gym twice a week.  Body mass index 21.12.  Fasting health labs with family physician.  Colonoscopy 2018.   Past medical history,surgical history, family history and social history were all reviewed and documented in the EPIC chart.  Gynecologic History No LMP recorded. Patient is postmenopausal.  Obstetric History OB History  Gravida Para Term Preterm AB Living  4 2     2 2   SAB TAB Ectopic Multiple Live Births               # Outcome Date GA Lbr Len/2nd Weight Sex Delivery Anes PTL Lv  4 AB           3 AB           2 Para           1 Para              ROS: A ROS was performed and pertinent positives and negatives are included in the history.  GENERAL: No fevers or chills. HEENT: No change in vision, no earache, sore throat or sinus congestion. NECK: No pain or stiffness. CARDIOVASCULAR: No chest pain or pressure. No palpitations. PULMONARY: No shortness of breath, cough or wheeze. GASTROINTESTINAL: No abdominal pain, nausea, vomiting or diarrhea, melena or bright red blood per rectum. GENITOURINARY: No urinary frequency, urgency, hesitancy or dysuria. MUSCULOSKELETAL: No joint or muscle pain, no back pain, no recent trauma. DERMATOLOGIC: No rash, no itching, no lesions. ENDOCRINE: No polyuria, polydipsia, no heat or cold intolerance. No recent change in weight. HEMATOLOGICAL: No anemia or easy bruising or bleeding. NEUROLOGIC: No headache, seizures, numbness, tingling or weakness. PSYCHIATRIC: No depression, no loss of interest in normal activity or  change in sleep pattern.     Exam:   BP 138/76   Ht 5' 4.5" (1.638 m)   Wt 125 lb (56.7 kg)   BMI 21.12 kg/m   Body mass index is 21.12 kg/m.  General appearance : Well developed well nourished female. No acute distress HEENT: Eyes: no retinal hemorrhage or exudates,  Neck supple, trachea midline, no carotid bruits, no thyroidmegaly Lungs: Clear to auscultation, no rhonchi or wheezes, or rib retractions  Heart: Regular rate and rhythm, no murmurs or gallops Breast:Examined in sitting and supine position were symmetrical in appearance, no palpable masses or tenderness,  no skin retraction, no nipple inversion, no nipple discharge, no skin discoloration, no axillary or supraclavicular lymphadenopathy Abdomen: no palpable masses or tenderness, no rebound or guarding Extremities: no edema or skin discoloration or tenderness  Pelvic: Vulva: Normal             Vagina: No gross lesions or discharge  Cervix: No gross lesions or discharge  Uterus  AV, normal size, shape and consistency, non-tender and mobile  Adnexa  Without masses or tenderness  Anus: Normal   Assessment/Plan:  59 y.o. female for annual exam   1. Well female exam with routine gynecological exam Normal gynecologic exam in menopause.  Pap test negative in May 2019, no indication to repeat this  year.  Breast exam normal.  Last screening mammogram in 2015, patient will schedule a screening mammogram now.  Colonoscopy in 2018.  Health labs with family physician.  Good body mass index at 21.12.  Continue with fitness and healthy nutrition.  2. Postmenopause Post menopause, well on no hormone replacement therapy.  No postmenopausal bleeding.  3. Osteopenia, unspecified location Osteopenia on remote bone density in 2006.  We will repeat a bone density now.  Recommend vitamin D supplements, calcium intake of 1200 mg daily and regular weightbearing physical activities. - DG Bone Density; Future  Princess Bruins MD, 2:55 PM  01/03/2020

## 2020-01-04 ENCOUNTER — Encounter: Payer: Self-pay | Admitting: Obstetrics & Gynecology

## 2020-01-04 NOTE — Patient Instructions (Addendum)
1. Well female exam with routine gynecological exam Normal gynecologic exam in menopause. Pap test negative in May 2019, no indication to repeat this year.  Breast exam normal.  Last screening mammogram in 2015, patient will schedule a screening mammogram now.  Colonoscopy in 2018.  Health labs with family physician.  Good body mass index at 21.12.  Continue with fitness and healthy nutrition.  2. Postmenopause Post menopause, well on no hormone replacement therapy.  No postmenopausal bleeding.  3. Osteopenia, unspecified location Osteopenia on remote bone density in 2006.  We will repeat a bone density now.  Recommend vitamin D supplements, calcium intake of 1200 mg daily and regular weightbearing physical activities. - DG Bone Density; Future  Abigail Wilson, it was a pleasure seeing you today!

## 2020-01-17 ENCOUNTER — Other Ambulatory Visit: Payer: Self-pay | Admitting: Obstetrics & Gynecology

## 2020-01-17 ENCOUNTER — Ambulatory Visit (INDEPENDENT_AMBULATORY_CARE_PROVIDER_SITE_OTHER): Payer: Medicare Other

## 2020-01-17 ENCOUNTER — Other Ambulatory Visit: Payer: Self-pay

## 2020-01-17 DIAGNOSIS — Z780 Asymptomatic menopausal state: Secondary | ICD-10-CM

## 2020-01-17 DIAGNOSIS — M8589 Other specified disorders of bone density and structure, multiple sites: Secondary | ICD-10-CM | POA: Diagnosis not present

## 2020-01-17 DIAGNOSIS — M8580 Other specified disorders of bone density and structure, unspecified site: Secondary | ICD-10-CM

## 2020-01-17 DIAGNOSIS — E034 Atrophy of thyroid (acquired): Secondary | ICD-10-CM

## 2020-01-17 NOTE — Telephone Encounter (Signed)
1.Medication Requested:oxyCODONE (OXYCONTIN) 40 mg 12 hr tablet oxyCODONE (OXYCONTIN) 40 mg 12 hr tablet oxyCODONE (OXYCONTIN) 40 mg 12 hr tablet oxyCODONE (OXYCONTIN) 80 mg 12 hr tablet oxyCODONE (OXYCONTIN) 80 mg 12 hr tablet oxyCODONE (OXYCONTIN) 80 mg 12 hr tablet  2. Pharmacy (Name, Street, [place]): [place], [place] - [address]  3. On Med List: yES   4. Last Visit with PCP: 3.2.2021   5. Next visit date with PCP: 6.2.2021    Agent: Please be advised that RX refills may take up to 3 business days. We ask that you follow-up with your pharmacy.

## 2020-01-17 NOTE — Telephone Encounter (Signed)
Please send to new pharmacy

## 2020-01-18 ENCOUNTER — Other Ambulatory Visit: Payer: Self-pay | Admitting: Internal Medicine

## 2020-01-18 ENCOUNTER — Telehealth: Payer: Self-pay

## 2020-01-18 DIAGNOSIS — E034 Atrophy of thyroid (acquired): Secondary | ICD-10-CM

## 2020-01-18 MED ORDER — OXYCODONE HCL ER 80 MG PO T12A: 80.0000 mg | EXTENDED_RELEASE_TABLET | Freq: Four times a day (QID) | ORAL | 0 refills | Status: DC

## 2020-01-18 MED ORDER — OXYCODONE HCL ER 40 MG PO T12A: 40.0000 mg | EXTENDED_RELEASE_TABLET | Freq: Four times a day (QID) | ORAL | 0 refills | Status: DC

## 2020-01-18 NOTE — Telephone Encounter (Signed)
   Patient calling to discuss issue with getting the 40mg  Oxycodone. Pharmacy states med on hold until June  Please call

## 2020-01-18 NOTE — Addendum Note (Signed)
Addended by: Karren Cobble on: 01/18/2020 04:48 PM   Modules accepted: Orders

## 2020-01-18 NOTE — Telephone Encounter (Deleted)
Error

## 2020-01-19 NOTE — Telephone Encounter (Signed)
    Patient calling , please resend rx  For 40mg   Oxycodone to pharmacy

## 2020-01-19 NOTE — Telephone Encounter (Signed)
RX sent yesterday and called pharmacy to verify

## 2020-01-25 MED ORDER — OXYCODONE HCL ER 40 MG PO T12A: 40.0000 mg | EXTENDED_RELEASE_TABLET | Freq: Four times a day (QID) | ORAL | 0 refills | Status: DC

## 2020-02-14 ENCOUNTER — Telehealth: Payer: Self-pay | Admitting: Internal Medicine

## 2020-02-14 NOTE — Telephone Encounter (Signed)
Last refilled back in 10/2016. Pls advise if ok to refill.Marland KitchenJohny Chess

## 2020-02-14 NOTE — Telephone Encounter (Signed)
    1.Medication Requested:fluticasone (FLONASE) 50 MCG/ACT nasal spray  2. Pharmacy (Name, Street, City):[facility] [phone] - [place], [place] - [address]  3. On Med List: yes  4. Last Visit with PCP:   5. Next visit date with PCP:02/15/20   Agent: Please be advised that RX refills may take up to 3 business days. We ask that you follow-up with your pharmacy.

## 2020-02-15 ENCOUNTER — Other Ambulatory Visit: Payer: Self-pay

## 2020-02-15 ENCOUNTER — Encounter: Payer: Self-pay | Admitting: Internal Medicine

## 2020-02-15 ENCOUNTER — Ambulatory Visit (INDEPENDENT_AMBULATORY_CARE_PROVIDER_SITE_OTHER): Payer: Medicare Other | Admitting: Internal Medicine

## 2020-02-15 VITALS — BP 120/88 | HR 66 | Temp 98.0°F | Ht 64.5 in | Wt 127.0 lb

## 2020-02-15 DIAGNOSIS — G8929 Other chronic pain: Secondary | ICD-10-CM

## 2020-02-15 DIAGNOSIS — E034 Atrophy of thyroid (acquired): Secondary | ICD-10-CM

## 2020-02-15 DIAGNOSIS — M5440 Lumbago with sciatica, unspecified side: Secondary | ICD-10-CM | POA: Diagnosis not present

## 2020-02-15 DIAGNOSIS — R5383 Other fatigue: Secondary | ICD-10-CM | POA: Diagnosis not present

## 2020-02-15 MED ORDER — OXYCODONE HCL ER 80 MG PO T12A: 80.0000 mg | EXTENDED_RELEASE_TABLET | Freq: Four times a day (QID) | ORAL | 0 refills | Status: DC

## 2020-02-15 MED ORDER — FLUTICASONE PROPIONATE 50 MCG/ACT NA SUSP: 2.0000 | Freq: Every day | NASAL | 11 refills | Status: DC

## 2020-02-15 MED ORDER — OXYCODONE HCL ER 40 MG PO T12A: 40.0000 mg | EXTENDED_RELEASE_TABLET | Freq: Four times a day (QID) | ORAL | 0 refills | Status: DC

## 2020-02-15 NOTE — Telephone Encounter (Signed)
Done. Thanks.

## 2020-02-15 NOTE — Progress Notes (Signed)
Subjective:  Patient ID: Abigail Wilson, female    DOB: 1961-07-18  Age: 59 y.o. MRN: [id number]  CC: No chief complaint on file.   HPI Abigail Wilson presents for chronic LBP, hypothyroidism f/u  Outpatient Medications Prior to Visit  Medication Sig Dispense Refill  . albuterol (PROVENTIL HFA;VENTOLIN HFA) 108 (90 Base) MCG/ACT inhaler Inhale 1-2 puffs into the lungs every 6 (six) hours as needed for wheezing or shortness of breath. 8.5 each 1  . aspirin 81 MG EC tablet Take 81 mg by mouth daily.      . Cholecalciferol 3000 units TABS Take 2 tablets by mouth daily.    [name] COENZYME Q-10 PO Take 200 mg by mouth daily.     . furosemide (LASIX) 40 MG tablet TAKE 1 TABLET BY MOUTH EVERY DAY 90 tablet 3  . KLOR-CON M20 20 MEQ tablet TAKE 1 TABLET BY MOUTH EVERY DAY 90 tablet 3  . levothyroxine (SYNTHROID) 100 MCG tablet Take 1 tablet (100 mcg total) by mouth daily. 90 tablet 3  . oxyCODONE (OXYCONTIN) 40 mg 12 hr tablet Take 1 tablet (40 mg total) by mouth every 6 (six) hours. Please fill on or after 11/16/19 120 tablet 0  . oxyCODONE (OXYCONTIN) 40 mg 12 hr tablet Take 1 tablet (40 mg total) by mouth every 6 (six) hours. 120 tablet 0  . oxyCODONE (OXYCONTIN) 40 mg 12 hr tablet Take 1 tablet (40 mg total) by mouth every 6 (six) hours. 120 tablet 0  . oxyCODONE (OXYCONTIN) 40 mg 12 hr tablet Take 1 tablet (40 mg total) by mouth every 6 (six) hours. 120 tablet 0  . oxyCODONE (OXYCONTIN) 80 mg 12 hr tablet Take 1 tablet (80 mg total) by mouth every 6 (six) hours. 120 tablet 0  . oxyCODONE (OXYCONTIN) 80 mg 12 hr tablet Take 1 tablet (80 mg total) by mouth every 6 (six) hours. 120 tablet 0  . Red Yeast Rice 600 MG CAPS 1 po qd 100 capsule 3  . fluticasone (FLONASE) 50 MCG/ACT nasal spray Place 2 sprays into both nostrils daily. 16 g 11  . oxyCODONE (OXYCONTIN) 40 mg 12 hr tablet Take 1 tablet (40 mg total) by mouth every 6 (six) hours. 120 tablet 0  . oxyCODONE (OXYCONTIN) 80 mg 12 hr tablet  Take 1 tablet (80 mg total) by mouth every 6 (six) hours. 120 tablet 0   No facility-administered medications prior to visit.    ROS: Review of Systems  Constitutional: Negative for activity change, appetite change, chills, fatigue and unexpected weight change.  HENT: Negative for congestion, mouth sores and sinus pressure.   Eyes: Negative for visual disturbance.  Respiratory: Negative for cough and chest tightness.   Gastrointestinal: Negative for abdominal pain and nausea.  Genitourinary: Negative for difficulty urinating, frequency and vaginal pain.  Musculoskeletal: Negative for back pain and gait problem.  Skin: Negative for pallor and rash.  Neurological: Negative for dizziness, tremors, weakness, numbness and headaches.  Psychiatric/Behavioral: Negative for confusion, sleep disturbance and suicidal ideas.    Objective:  BP 120/88 (BP Location: Left Arm, Patient Position: Sitting, Cuff Size: Normal)   Pulse 66   Temp 98 F (36.7 C) (Oral)   Ht 5' 4.5" (1.638 m)   Wt 127 lb (57.6 kg)   SpO2 99%   BMI 21.46 kg/m   BP Readings from Last 3 Encounters:  02/15/20 120/88  01/03/20 138/76  11/15/19 122/76    Wt Readings from Last 3 Encounters:  02/15/20  127 lb (57.6 kg)  01/03/20 125 lb (56.7 kg)  11/15/19 131 lb (59.4 kg)    Physical Exam Constitutional:      General: She is not in acute distress.    Appearance: She is well-developed.  HENT:     Head: Normocephalic.     Right Ear: External ear normal.     Left Ear: External ear normal.     Nose: Nose normal.  Eyes:     General:        Right eye: No discharge.        Left eye: No discharge.     Conjunctiva/sclera: Conjunctivae normal.     Pupils: Pupils are equal, round, and reactive to light.  Neck:     Thyroid: No thyromegaly.     Vascular: No JVD.     Trachea: No tracheal deviation.  Cardiovascular:     Rate and Rhythm: Normal rate and regular rhythm.     Heart sounds: Normal heart sounds.  Pulmonary:      Effort: No respiratory distress.     Breath sounds: No stridor. No wheezing.  Abdominal:     General: Bowel sounds are normal. There is no distension.     Palpations: Abdomen is soft. There is no mass.     Tenderness: There is no abdominal tenderness. There is no guarding or rebound.  Musculoskeletal:        General: No tenderness.     Cervical back: Normal range of motion and neck supple.  Lymphadenopathy:     Cervical: No cervical adenopathy.  Skin:    Findings: No erythema or rash.  Neurological:     Mental Status: She is oriented to person, place, and time.     Cranial Nerves: No cranial nerve deficit.     Motor: No abnormal muscle tone.     Coordination: Coordination normal.     Deep Tendon Reflexes: Reflexes normal.  Psychiatric:        Behavior: Behavior normal.        Thought Content: Thought content normal.        Judgment: Judgment normal.     Lab Results  Component Value Date   WBC 5.5 11/18/2019   HGB 13.5 11/18/2019   HCT 38.7 11/18/2019   PLT 238.0 11/18/2019   GLUCOSE 95 11/18/2019   CHOL 204 (H) 11/18/2019   TRIG 55.0 11/18/2019   HDL 101.60 11/18/2019   LDLCALC 91 11/18/2019   ALT 15 11/18/2019   AST 28 11/18/2019   NA 136 11/18/2019   K 4.5 11/18/2019   CL 99 11/18/2019   CREATININE 0.83 11/18/2019   BUN 14 11/18/2019   CO2 30 11/18/2019   TSH 4.85 (H) 11/18/2019    CT CARDIAC SCORING  Addendum Date: 10/19/2018   ADDENDUM REPORT: 10/19/2018 16:45 CLINICAL DATA:  Risk stratification EXAM: Coronary Calcium Score TECHNIQUE: The patient was scanned on a [employer] scanner. Axial non-contrast 3 mm slices were carried out through the heart. The data set was analyzed on a dedicated work station and scored using the [place]. FINDINGS: Non-cardiac: See separate report from [facility]. Ascending Aorta: Normal size, no calcifications. Pericardium: Normal. Coronary arteries: Normal origin. IMPRESSION: Coronary calcium score of 3. This  was 62 percentile for age and sex matched control. Electronically Signed   By: Ena Dawley   On: 10/19/2018 16:45   Result Date: 10/19/2018 EXAM: OVER-READ INTERPRETATION  CT CHEST The following report is an over-read performed by radiologist Dr. Lennette Bihari  [facility], [place] on 10/19/2018. This over-read does not include interpretation of cardiac or coronary anatomy or pathology. The coronary calcium score interpretation by the cardiologist is attached. COMPARISON:  PET CT 10/27/2005 FINDINGS: Vascular: Heart is normal size.  Aorta is normal caliber. Mediastinum/Nodes: No adenopathy in the lower mediastinum or hila. Lungs/Pleura: 13 mm nodule in the lingula, increased slightly in size 2007 when this measured 10 mm. Linear scarring in the lung bases. No effusions. Calcified granuloma in the left lower lobe. Upper Abdomen: Imaging into the upper abdomen shows no acute findings. Musculoskeletal: Chest wall soft tissues are unremarkable. No acute bony abnormality. IMPRESSION: 13 mm nodule in the lingula, slightly increased in size from 10 mm in 2007. This was shown on prior PET CT to be most compatible with a benign nodule. No acute extra cardiac abnormality. Electronically Signed: By: Rolm Baptise M.D. On: 10/19/2018 16:10    Assessment & Plan:   Diagnoses and all orders for this visit:  Other fatigue -     T4, free; Future -     TSH; Future -     T3, free; Future -     SAR CoV2 Serology (COVID 19)AB(IGG)IA; Future  Hypothyroidism due to acquired atrophy of thyroid -     oxyCODONE (OXYCONTIN) 80 mg 12 hr tablet; Take 1 tablet (80 mg total) by mouth every 6 (six) hours. -     oxyCODONE (OXYCONTIN) 40 mg 12 hr tablet; Take 1 tablet (40 mg total) by mouth every 6 (six) hours. -     T4, free; Future -     TSH; Future -     T3, free; Future  Other orders -     fluticasone (FLONASE) 50 MCG/ACT nasal spray; Place 2 sprays into both nostrils daily. -     oxyCODONE (OXYCONTIN) 40 mg 12 hr  tablet; Take 1 tablet (40 mg total) by mouth every 6 (six) hours. Please fill on or after 04/16/20 -     oxyCODONE (OXYCONTIN) 40 mg 12 hr tablet; Take 1 tablet (40 mg total) by mouth every 6 (six) hours. -     oxyCODONE (OXYCONTIN) 80 mg 12 hr tablet; Take 1 tablet (80 mg total) by mouth every 6 (six) hours. -     oxyCODONE (OXYCONTIN) 80 mg 12 hr tablet; Take 1 tablet (80 mg total) by mouth every 6 (six) hours.     Meds ordered this encounter  Medications  . fluticasone (FLONASE) 50 MCG/ACT nasal spray    Sig: Place 2 sprays into both nostrils daily.    Dispense:  16 g    Refill:  11  . oxyCODONE (OXYCONTIN) 80 mg 12 hr tablet    Sig: Take 1 tablet (80 mg total) by mouth every 6 (six) hours.    Dispense:  120 tablet    Refill:  0    Please fill on or after 03/17/20  . oxyCODONE (OXYCONTIN) 40 mg 12 hr tablet    Sig: Take 1 tablet (40 mg total) by mouth every 6 (six) hours.    Dispense:  120 tablet    Refill:  0    Please fill on or after 02/17/20. 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 oxyCODONE (OXYCONTIN) 40 mg 12 hr tablet    Sig: Take 1 tablet (40 mg total) by mouth every 6 (six) hours. Please fill on or after 04/16/20    Dispense:  120 tablet    Refill:  0  . oxyCODONE (OXYCONTIN) 40 mg  12 hr tablet    Sig: Take 1 tablet (40 mg total) by mouth every 6 (six) hours.    Dispense:  120 tablet    Refill:  0    Please fill on or after 04/16/20  . oxyCODONE (OXYCONTIN) 80 mg 12 hr tablet    Sig: Take 1 tablet (80 mg total) by mouth every 6 (six) hours.    Dispense:  120 tablet    Refill:  0    Please fill on or after 02/17/20  . oxyCODONE (OXYCONTIN) 80 mg 12 hr tablet    Sig: Take 1 tablet (80 mg total) by mouth every 6 (six) hours.    Dispense:  120 tablet    Refill:  0    Please fill on or after 03/17/20     Follow-up: Return in about 3 months (around 05/17/2020) for a follow-up visit.  Walker Kehr, MD

## 2020-02-16 LAB — SARS-COV-2 ANTIBODY(IGG)SPIKE,SEMI-QUANTITATIVE: SARS COV1 AB(IGG)SPIKE,SEMI QN: <1 index (ref <1.00)

## 2020-02-16 LAB — T3, FREE: T3, Free: 2.9 pg/mL (ref 2.3–4.2)

## 2020-02-16 LAB — T4, FREE: Free T4: 0.83 ng/dL (ref 0.60–1.60)

## 2020-02-16 LAB — TSH: TSH: 5.05 u[IU]/mL — ABNORMAL HIGH (ref 0.35–4.50)

## 2020-02-18 NOTE — Assessment & Plan Note (Signed)
Chronic Oxycodone/Oxycontin Rx - long term  Potential benefits of a long term opioids use as well as potential risks (i.e. addiction risk, apnea etc) and complications (i.e. Somnolence, constipation and others) were explained to the patient and were aknowledged. 

## 2020-03-06 ENCOUNTER — Other Ambulatory Visit: Payer: Self-pay | Admitting: *Deleted

## 2020-03-06 DIAGNOSIS — E034 Atrophy of thyroid (acquired): Secondary | ICD-10-CM

## 2020-03-28 ENCOUNTER — Other Ambulatory Visit: Payer: Self-pay | Admitting: Internal Medicine

## 2020-04-18 ENCOUNTER — Other Ambulatory Visit: Payer: Self-pay | Admitting: Internal Medicine

## 2020-04-18 DIAGNOSIS — E034 Atrophy of thyroid (acquired): Secondary | ICD-10-CM

## 2020-04-18 NOTE — Telephone Encounter (Signed)
    [facility] calling, states they were having system issues. Please resend oxyCODONE (OXYCONTIN) 80 mg 12 hr tablet

## 2020-04-18 NOTE — Telephone Encounter (Signed)
[place] Controlled Database Checked Last filled: 03/20/2020 (120) LOV w/you: 02/15/2020 Next appt w/you: 05/17/2020

## 2020-04-19 MED ORDER — OXYCODONE HCL ER 80 MG PO T12A: 80.0000 mg | EXTENDED_RELEASE_TABLET | Freq: Four times a day (QID) | ORAL | 0 refills | Status: DC

## 2020-04-19 NOTE — Telephone Encounter (Signed)
See med refill 04/19/20

## 2020-05-10 ENCOUNTER — Encounter: Payer: Self-pay | Admitting: Internal Medicine

## 2020-05-10 ENCOUNTER — Other Ambulatory Visit: Payer: Self-pay

## 2020-05-10 ENCOUNTER — Ambulatory Visit (INDEPENDENT_AMBULATORY_CARE_PROVIDER_SITE_OTHER): Payer: Medicare Other | Admitting: Internal Medicine

## 2020-05-10 DIAGNOSIS — E034 Atrophy of thyroid (acquired): Secondary | ICD-10-CM | POA: Diagnosis not present

## 2020-05-10 DIAGNOSIS — R634 Abnormal weight loss: Secondary | ICD-10-CM

## 2020-05-10 DIAGNOSIS — I2510 Atherosclerotic heart disease of native coronary artery without angina pectoris: Secondary | ICD-10-CM

## 2020-05-10 DIAGNOSIS — I2583 Coronary atherosclerosis due to lipid rich plaque: Secondary | ICD-10-CM

## 2020-05-10 MED ORDER — OXYCODONE HCL ER 40 MG PO T12A: 40.0000 mg | EXTENDED_RELEASE_TABLET | Freq: Four times a day (QID) | ORAL | 0 refills | Status: DC

## 2020-05-10 MED ORDER — OXYCODONE HCL ER 80 MG PO T12A: 80.0000 mg | EXTENDED_RELEASE_TABLET | Freq: Four times a day (QID) | ORAL | 0 refills | Status: DC

## 2020-05-10 NOTE — Assessment & Plan Note (Signed)
Wt Readings from Last 3 Encounters:  05/10/20 124 lb (56.2 kg)  02/15/20 127 lb (57.6 kg)  01/03/20 125 lb (56.7 kg)

## 2020-05-10 NOTE — Progress Notes (Addendum)
Subjective:  Patient ID: Abigail Wilson, female    DOB: 1961-07-16  Age: 59 y.o. MRN: [id number]  CC: No chief complaint on file.   HPI ZAREA DIESING presents for chronic LBP, hypothyroidism, dyslipidemia  Outpatient Medications Prior to Visit  Medication Sig Dispense Refill  . albuterol (PROVENTIL HFA;VENTOLIN HFA) 108 (90 Base) MCG/ACT inhaler Inhale 1-2 puffs into the lungs every 6 (six) hours as needed for wheezing or shortness of breath. 8.5 each 1  . aspirin 81 MG EC tablet Take 81 mg by mouth daily.      . Cholecalciferol 3000 units TABS Take 2 tablets by mouth daily.    [name] COENZYME Q-10 PO Take 200 mg by mouth daily.     . fluticasone (FLONASE) 50 MCG/ACT nasal spray Place 2 sprays into both nostrils daily. 16 g 11  . furosemide (LASIX) 40 MG tablet TAKE 1 TABLET BY MOUTH EVERY DAY 90 tablet 3  . KLOR-CON M20 20 MEQ tablet TAKE 1 TABLET BY MOUTH EVERY DAY 90 tablet 3  . levothyroxine (SYNTHROID) 100 MCG tablet Take 1 tablet (100 mcg total) by mouth daily. 90 tablet 3  . oxyCODONE (OXYCONTIN) 40 mg 12 hr tablet Take 1 tablet (40 mg total) by mouth every 6 (six) hours. Please fill on or after 11/16/19 120 tablet 0  . oxyCODONE (OXYCONTIN) 40 mg 12 hr tablet Take 1 tablet (40 mg total) by mouth every 6 (six) hours. 120 tablet 0  . oxyCODONE (OXYCONTIN) 40 mg 12 hr tablet Take 1 tablet (40 mg total) by mouth every 6 (six) hours. 120 tablet 0  . oxyCODONE (OXYCONTIN) 40 mg 12 hr tablet Take 1 tablet (40 mg total) by mouth every 6 (six) hours. 120 tablet 0  . oxyCODONE (OXYCONTIN) 80 mg 12 hr tablet Take 1 tablet (80 mg total) by mouth every 6 (six) hours. 120 tablet 0  . oxyCODONE (OXYCONTIN) 80 mg 12 hr tablet Take 1 tablet (80 mg total) by mouth every 6 (six) hours. 120 tablet 0  . oxyCODONE (OXYCONTIN) 80 mg 12 hr tablet Take 1 tablet (80 mg total) by mouth every 6 (six) hours. 120 tablet 0  . oxyCODONE (OXYCONTIN) 80 mg 12 hr tablet Take 1 tablet (80 mg total) by mouth every 6  (six) hours. 120 tablet 0  . oxyCODONE (OXYCONTIN) 80 mg 12 hr tablet Take 1 tablet (80 mg total) by mouth every 6 (six) hours. 120 tablet 0  . Red Yeast Rice 600 MG CAPS 1 po qd 100 capsule 3  . oxyCODONE (OXYCONTIN) 40 mg 12 hr tablet Take 1 tablet (40 mg total) by mouth every 6 (six) hours. 120 tablet 0  . oxyCODONE (OXYCONTIN) 40 mg 12 hr tablet Take 1 tablet (40 mg total) by mouth every 6 (six) hours. 120 tablet 0  . oxyCODONE (OXYCONTIN) 40 mg 12 hr tablet Take 1 tablet (40 mg total) by mouth every 6 (six) hours. Please fill on or after 04/16/20 120 tablet 0   No facility-administered medications prior to visit.    ROS: Review of Systems  Constitutional: Negative for activity change, appetite change, chills, fatigue and unexpected weight change.  HENT: Negative for congestion, mouth sores and sinus pressure.   Eyes: Negative for visual disturbance.  Respiratory: Negative for cough and chest tightness.   Gastrointestinal: Negative for abdominal pain and nausea.  Genitourinary: Negative for difficulty urinating, frequency and vaginal pain.  Musculoskeletal: Positive for back pain. Negative for gait problem.  Skin: Negative for  pallor and rash.  Neurological: Negative for dizziness, tremors, weakness, numbness and headaches.  Psychiatric/Behavioral: Negative for confusion and sleep disturbance.    Objective:  BP 136/76 (BP Location: Right Arm, Patient Position: Sitting, Cuff Size: Normal)   Pulse 71   Temp 98.2 F (36.8 C) (Oral)   Ht 5' 4.5" (1.638 m)   Wt 124 lb (56.2 kg)   SpO2 98%   BMI 20.96 kg/m   BP Readings from Last 3 Encounters:  05/10/20 136/76  02/15/20 120/88  01/03/20 138/76    Wt Readings from Last 3 Encounters:  05/10/20 124 lb (56.2 kg)  02/15/20 127 lb (57.6 kg)  01/03/20 125 lb (56.7 kg)    Physical Exam Constitutional:      General: She is not in acute distress.    Appearance: She is well-developed.  HENT:     Head: Normocephalic.     Right  Ear: External ear normal.     Left Ear: External ear normal.     Nose: Nose normal.  Eyes:     General:        Right eye: No discharge.        Left eye: No discharge.     Conjunctiva/sclera: Conjunctivae normal.     Pupils: Pupils are equal, round, and reactive to light.  Neck:     Thyroid: No thyromegaly.     Vascular: No JVD.     Trachea: No tracheal deviation.  Cardiovascular:     Rate and Rhythm: Normal rate and regular rhythm.     Heart sounds: Normal heart sounds.  Pulmonary:     Effort: No respiratory distress.     Breath sounds: No stridor. No wheezing.  Abdominal:     General: Bowel sounds are normal. There is no distension.     Palpations: Abdomen is soft. There is no mass.     Tenderness: There is no abdominal tenderness. There is no guarding or rebound.  Musculoskeletal:        General: Tenderness present.     Cervical back: Normal range of motion and neck supple.  Lymphadenopathy:     Cervical: No cervical adenopathy.  Skin:    Findings: No erythema or rash.  Neurological:     Cranial Nerves: No cranial nerve deficit.     Motor: No abnormal muscle tone.     Coordination: Coordination normal.     Deep Tendon Reflexes: Reflexes normal.  Psychiatric:        Behavior: Behavior normal.        Thought Content: Thought content normal.        Judgment: Judgment normal.    LS w/pain  Lab Results  Component Value Date   WBC 5.5 11/18/2019   HGB 13.5 11/18/2019   HCT 38.7 11/18/2019   PLT 238.0 11/18/2019   GLUCOSE 95 11/18/2019   CHOL 204 (H) 11/18/2019   TRIG 55.0 11/18/2019   HDL 101.60 11/18/2019   LDLCALC 91 11/18/2019   ALT 15 11/18/2019   AST 28 11/18/2019   NA 136 11/18/2019   K 4.5 11/18/2019   CL 99 11/18/2019   CREATININE 0.83 11/18/2019   BUN 14 11/18/2019   CO2 30 11/18/2019   TSH 5.05 (H) 02/15/2020    CT CARDIAC SCORING  Addendum Date: 10/19/2018   ADDENDUM REPORT: 10/19/2018 16:45 CLINICAL DATA:  Risk stratification EXAM: Coronary  Calcium Score TECHNIQUE: The patient was scanned on a [employer] scanner. Axial non-contrast 3 mm slices were carried out through  the heart. The data set was analyzed on a dedicated work station and scored using the [place]. FINDINGS: Non-cardiac: See separate report from [facility]. Ascending Aorta: Normal size, no calcifications. Pericardium: Normal. Coronary arteries: Normal origin. IMPRESSION: Coronary calcium score of 3. This was 31 percentile for age and sex matched control. Electronically Signed   By: Ena Dawley   On: 10/19/2018 16:45   Result Date: 10/19/2018 EXAM: OVER-READ INTERPRETATION  CT CHEST The following report is an over-read performed by radiologist Dr. Rolm Baptise of [facility], [place] on 10/19/2018. This over-read does not include interpretation of cardiac or coronary anatomy or pathology. The coronary calcium score interpretation by the cardiologist is attached. COMPARISON:  PET CT 10/27/2005 FINDINGS: Vascular: Heart is normal size.  Aorta is normal caliber. Mediastinum/Nodes: No adenopathy in the lower mediastinum or hila. Lungs/Pleura: 13 mm nodule in the lingula, increased slightly in size 2007 when this measured 10 mm. Linear scarring in the lung bases. No effusions. Calcified granuloma in the left lower lobe. Upper Abdomen: Imaging into the upper abdomen shows no acute findings. Musculoskeletal: Chest wall soft tissues are unremarkable. No acute bony abnormality. IMPRESSION: 13 mm nodule in the lingula, slightly increased in size from 10 mm in 2007. This was shown on prior PET CT to be most compatible with a benign nodule. No acute extra cardiac abnormality. Electronically Signed: By: Rolm Baptise M.D. On: 10/19/2018 16:10    Assessment & Plan:   Diagnoses and all orders for this visit:  Hypothyroidism due to acquired atrophy of thyroid -     oxyCODONE (OXYCONTIN) 40 mg 12 hr tablet; Take 1 tablet (40 mg total) by mouth every 6 (six) hours. -      oxyCODONE (OXYCONTIN) 40 mg 12 hr tablet; Take 1 tablet (40 mg total) by mouth every 6 (six) hours.  Other orders -     oxyCODONE (OXYCONTIN) 40 mg 12 hr tablet; Take 1 tablet (40 mg total) by mouth every 6 (six) hours. Please fill on or after 05/16/20     Meds ordered this encounter  Medications  . oxyCODONE (OXYCONTIN) 40 mg 12 hr tablet    Sig: Take 1 tablet (40 mg total) by mouth every 6 (six) hours.    Dispense:  120 tablet    Refill:  0    Please fill on or after 07/15/20. 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 oxyCODONE (OXYCONTIN) 40 mg 12 hr tablet    Sig: Take 1 tablet (40 mg total) by mouth every 6 (six) hours.    Dispense:  120 tablet    Refill:  0    Please fill on or after 06/15/20. 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 oxyCODONE (OXYCONTIN) 40 mg 12 hr tablet    Sig: Take 1 tablet (40 mg total) by mouth every 6 (six) hours. Please fill on or after 05/16/20    Dispense:  120 tablet    Refill:  0     Follow-up: No follow-ups on file.  Walker Kehr, MD

## 2020-05-17 ENCOUNTER — Ambulatory Visit: Payer: Medicare Other | Admitting: Internal Medicine

## 2020-05-18 ENCOUNTER — Telehealth: Payer: Self-pay | Admitting: Internal Medicine

## 2020-05-18 NOTE — Telephone Encounter (Signed)
   Patient calling to request prior auth been obtained for oxyCODONE (OXYCONTIN) 40 mg 12 hr tablet oxyCODONE (OXYCONTIN) 80 mg 12 hr tablet with [employer] [phone]

## 2020-05-23 NOTE — Telephone Encounter (Signed)
PA initiated for Oxycodone 40 mg Key: [id number]

## 2020-05-25 NOTE — Telephone Encounter (Signed)
PA was approved, pharmacy contacted

## 2020-06-12 DIAGNOSIS — H04123 Dry eye syndrome of bilateral lacrimal glands: Secondary | ICD-10-CM | POA: Diagnosis not present

## 2020-06-12 DIAGNOSIS — H25813 Combined forms of age-related cataract, bilateral: Secondary | ICD-10-CM | POA: Diagnosis not present

## 2020-07-11 ENCOUNTER — Telehealth: Payer: Self-pay | Admitting: Internal Medicine

## 2020-07-11 DIAGNOSIS — E034 Atrophy of thyroid (acquired): Secondary | ICD-10-CM

## 2020-07-11 NOTE — Telephone Encounter (Signed)
oxyCODONE (OXYCONTIN) 40 mg 12 hr tablet oxyCODONE (OXYCONTIN) 80 mg 12 hr tablet Paper prescriptions for these medications dated back to 05/10/20 She said they are supposed to be sent electronically and needs clairification April from [facility] in [place] #[phone] [address]

## 2020-07-13 NOTE — Telephone Encounter (Signed)
Called April w/[facility] she states ptihas change pharmacy to them. They are no longer using [facility].  The pt brought in a paper rx for his pain med, but they can not take paper rx she states they must be sent electronically. Requesting rx for Nov 1st to be sent electronically.Updated the pharmacy.Abigail Wilson

## 2020-07-14 ENCOUNTER — Ambulatory Visit (INDEPENDENT_AMBULATORY_CARE_PROVIDER_SITE_OTHER): Payer: Medicare HMO

## 2020-07-14 DIAGNOSIS — Z23 Encounter for immunization: Secondary | ICD-10-CM

## 2020-07-17 MED ORDER — OXYCODONE HCL ER 80 MG PO T12A: 80.0000 mg | EXTENDED_RELEASE_TABLET | Freq: Four times a day (QID) | ORAL | 0 refills | Status: DC

## 2020-07-17 MED ORDER — OXYCODONE HCL ER 40 MG PO T12A: 40.0000 mg | EXTENDED_RELEASE_TABLET | Freq: Four times a day (QID) | ORAL | 0 refills | Status: DC

## 2020-07-17 NOTE — Telephone Encounter (Signed)
Ok done Thx 

## 2020-07-31 ENCOUNTER — Other Ambulatory Visit: Payer: Self-pay

## 2020-07-31 ENCOUNTER — Telehealth (INDEPENDENT_AMBULATORY_CARE_PROVIDER_SITE_OTHER): Payer: Medicare HMO | Admitting: Family Medicine

## 2020-07-31 DIAGNOSIS — R059 Cough, unspecified: Secondary | ICD-10-CM | POA: Diagnosis not present

## 2020-07-31 DIAGNOSIS — R0981 Nasal congestion: Secondary | ICD-10-CM | POA: Diagnosis not present

## 2020-07-31 DIAGNOSIS — R067 Sneezing: Secondary | ICD-10-CM | POA: Diagnosis not present

## 2020-07-31 NOTE — Progress Notes (Signed)
Virtual Visit via Telephone Note  I connected with Abigail Wilson on 07/31/20 at 11:00 AM EST by telephone and verified that I am speaking with the correct person using two identifiers.   I discussed the limitations, risks, security and privacy concerns of performing an evaluation and management service by telephone and the availability of in person appointments. I also discussed with the patient that there may be a patient responsible charge related to this service. The patient expressed understanding and agreed to proceed.  Location patient: home, [place] Location provider: work or home office Participants present for the call: patient, provider Patient did not have a visit with me in the prior 7 days to address this/these issue(s).   History of Present Illness:  Acute telemedicine visit for sinus issues: -Onset: 2 days ago -Symptoms include:sinus congestion, feeling hot and cold - but denies fever, cough,  fullness in L ear, sneezing -feeling a little better today, but did wonder if she could have Covid and wants to avoid being around others if so, wonders about testing options -Denies: documented fevers, CP, SOB, NVD, body aches, malaise -Has tried:drinking lots of fluids, ibuprofen -Pertinent past medical history: takes flonase for allergies -Pertinent medication allergies: See allergies -COVID-19 vaccine status:had flu vaccine, has not had covid vaccine   Observations/Objective: Patient sounds cheerful and well on the phone. I do not appreciate any SOB. Speech and thought processing are grossly intact. Patient reported vitals:  Assessment and Plan:  Nasal congestion  Sneezing  Cough  -we discussed possible serious and likely etiologies, options for evaluation and workup, limitations of telemedicine visit vs in person visit, treatment, treatment risks and precautions. Pt prefers to treat via telemedicine empirically rather than in person at this moment.  Query allergies,  viral upper respiratory illness, Covid versus other.  She opted for supportive care with Aleve or Tylenol, nasal saline, continuation of her allergy regimen.  Discussed testing options for COVID-19 and she plans to do this today, also discussed treatment, potential complications, precautions.  She agrees to follow-up with PCP if positive as she is interested in treatment.  Advised to stay home while sick and for 10 days if Covid test is positive.  Advised to seek prompt in person care if worsening, new symptoms arise, or if is not improving with treatment. Advised of options for inperson care in case PCP office not available. Did let the patient know that I only do telemedicine shifts for [place] on Tuesdays and Thursdays and advised a follow up visit with PCP or at an [facility] if has further questions or concerns.   Follow Up Instructions:  I did not refer this patient for an OV with me in the next 24 hours for this/these issue(s).  I discussed the assessment and treatment plan with the patient. The patient was provided an opportunity to ask questions and all were answered. The patient agreed with the plan and demonstrated an understanding of the instructions.   I spent 17 minutes on this encounter.   Lucretia Kern, DO

## 2020-07-31 NOTE — Patient Instructions (Signed)
   It was nice to meet you today, and I really hope you are feeling better soon. I help [place] out with telemedicine visits on Tuesdays and Thursdays and am available for visits on those days. If you have any concerns or questions following this visit please schedule a follow up visit with your Primary Care doctor or seek care at a local urgent care clinic to avoid delays in care.    Seek in person care promptly if your symptoms worsen, new concerns arise or you are not improving with treatment. Call 911 and/or seek emergency care if you symptoms are severe or life threatening.   -stay home while sick, and if you have [place] please stay home for a full 10 days since the onset of symptoms PLUS one day of no fever and feeling better  -[place] COVID19 testing information: https://www.[facility].org/ [place] [phone] Most pharmacies also offer testing.   -can use tylenol or aleve if needed for fevers, aches and pains per instructions  -can use nasal saline a few times per day if nasal congestion, sometime a short course of Afrin nasal spray for 3 days can help as well  -stay hydrated, drink plenty of fluids and eat small healthy meals - avoid dairy  -can take 1000 IU Vit D3 and Vit C lozenges per instructions  -follow up with your doctor in 2-3 days unless improving and feeling better

## 2020-08-03 ENCOUNTER — Telehealth: Payer: Self-pay | Admitting: Internal Medicine

## 2020-08-03 NOTE — Telephone Encounter (Signed)
Patient was seen on 11.16.21 by Dr. Maudie Mercury and she has taken all her medication but is still having issues with sinuses. Wondering if she can get an antibiotic. Denies appointment at this time. [phone]

## 2020-08-05 MED ORDER — AZITHROMYCIN 250 MG PO TABS: ORAL_TABLET | ORAL | 0 refills | Status: DC

## 2020-08-05 NOTE — Telephone Encounter (Signed)
Francie Massing emailed.  Thanks

## 2020-08-06 ENCOUNTER — Telehealth: Payer: Self-pay | Admitting: Internal Medicine

## 2020-08-06 NOTE — Telephone Encounter (Signed)
Caller states she has cough, congestion, nasal drainage since the past week. Caller states she had a telemedicine on Tuesday and had 2 home Covid tests that came back negative on Tuesday and Thursday. Caller states no fever but some yellow nasal drainage past day or so. The caller was refusing triage and was hoping for rx for antibiotic.

## 2020-08-14 ENCOUNTER — Ambulatory Visit (INDEPENDENT_AMBULATORY_CARE_PROVIDER_SITE_OTHER): Payer: Medicare HMO | Admitting: Internal Medicine

## 2020-08-14 ENCOUNTER — Encounter: Payer: Self-pay | Admitting: Internal Medicine

## 2020-08-14 ENCOUNTER — Other Ambulatory Visit: Payer: Self-pay

## 2020-08-14 DIAGNOSIS — J069 Acute upper respiratory infection, unspecified: Secondary | ICD-10-CM

## 2020-08-14 DIAGNOSIS — M5440 Lumbago with sciatica, unspecified side: Secondary | ICD-10-CM

## 2020-08-14 DIAGNOSIS — G8929 Other chronic pain: Secondary | ICD-10-CM | POA: Diagnosis not present

## 2020-08-14 DIAGNOSIS — R031 Nonspecific low blood-pressure reading: Secondary | ICD-10-CM

## 2020-08-14 DIAGNOSIS — E034 Atrophy of thyroid (acquired): Secondary | ICD-10-CM | POA: Diagnosis not present

## 2020-08-14 DIAGNOSIS — Z79899 Other long term (current) drug therapy: Secondary | ICD-10-CM

## 2020-08-14 MED ORDER — OXYCODONE HCL ER 80 MG PO T12A: 80.0000 mg | EXTENDED_RELEASE_TABLET | Freq: Four times a day (QID) | ORAL | 0 refills | Status: DC

## 2020-08-14 MED ORDER — OXYCODONE HCL ER 40 MG PO T12A: 40.0000 mg | EXTENDED_RELEASE_TABLET | Freq: Four times a day (QID) | ORAL | 0 refills | Status: DC

## 2020-08-14 NOTE — Assessment & Plan Note (Signed)
Chronic Oxycodone/Oxycontin Rx - long term  Potential benefits of a long term opioids use as well as potential risks (i.e. addiction risk, apnea etc) and complications (i.e. Somnolence, constipation and others) were explained to the patient and were aknowledged. 

## 2020-08-14 NOTE — Addendum Note (Signed)
Addended by: Boris Lown B on: 08/14/2020 04:27 PM   Modules accepted: Orders

## 2020-08-14 NOTE — Addendum Note (Signed)
Addended by: Cassandria Anger on: 08/14/2020 04:19 PM   Modules accepted: Orders

## 2020-08-14 NOTE — Progress Notes (Signed)
Subjective:  Patient ID: Abigail Wilson, female    DOB: 1961-05-03  Age: 59 y.o. MRN: [id number]  CC: Follow-up (3 month f/u meds.  no concerns.  )   HPI Abigail Wilson presents for URI, chronic pain, hypothyroidism  On abx now - better    Outpatient Medications Prior to Visit  Medication Sig Dispense Refill  . albuterol (PROVENTIL HFA;VENTOLIN HFA) 108 (90 Base) MCG/ACT inhaler Inhale 1-2 puffs into the lungs every 6 (six) hours as needed for wheezing or shortness of breath. 8.5 each 1  . aspirin 81 MG EC tablet Take 81 mg by mouth daily.      . Cholecalciferol 3000 units TABS Take 2 tablets by mouth daily.    [name] COENZYME Q-10 PO Take 200 mg by mouth daily.     . fluticasone (FLONASE) 50 MCG/ACT nasal spray Place 2 sprays into both nostrils daily. 16 g 11  . furosemide (LASIX) 40 MG tablet TAKE 1 TABLET BY MOUTH EVERY DAY 90 tablet 3  . KLOR-CON M20 20 MEQ tablet TAKE 1 TABLET BY MOUTH EVERY DAY 90 tablet 3  . levothyroxine (SYNTHROID) 100 MCG tablet Take 1 tablet (100 mcg total) by mouth daily. 90 tablet 3  . oxyCODONE (OXYCONTIN) 40 mg 12 hr tablet Take 1 tablet (40 mg total) by mouth every 6 (six) hours. Please fill on or after 07/17/20 120 tablet 0  . oxyCODONE (OXYCONTIN) 80 mg 12 hr tablet Take 1 tablet (80 mg total) by mouth every 6 (six) hours. 120 tablet 0  . Red Yeast Rice 600 MG CAPS 1 po qd 100 capsule 3  . azithromycin (ZITHROMAX Z-PAK) 250 MG tablet As directed (Patient not taking: Reported on 08/14/2020) 6 tablet 0   No facility-administered medications prior to visit.    ROS: Review of Systems  Constitutional: Negative for activity change, appetite change, chills, fatigue and unexpected weight change.  HENT: Positive for congestion. Negative for mouth sores and sinus pressure.   Eyes: Negative for visual disturbance.  Respiratory: Positive for cough. Negative for chest tightness.   Gastrointestinal: Negative for abdominal pain and nausea.  Genitourinary:  Negative for difficulty urinating, frequency and vaginal pain.  Musculoskeletal: Positive for back pain. Negative for gait problem.  Skin: Negative for pallor and rash.  Neurological: Negative for dizziness, tremors, weakness, numbness and headaches.  Psychiatric/Behavioral: Negative for confusion and sleep disturbance.    Objective:  BP 116/70   Pulse 66   Temp 98.5 F (36.9 C) (Oral)   Ht 5' 4.5" (1.638 m)   Wt 126 lb 12.8 oz (57.5 kg)   SpO2 97%   BMI 21.43 kg/m   BP Readings from Last 3 Encounters:  08/14/20 116/70  05/10/20 136/76  02/15/20 120/88    Wt Readings from Last 3 Encounters:  08/14/20 126 lb 12.8 oz (57.5 kg)  05/10/20 124 lb (56.2 kg)  02/15/20 127 lb (57.6 kg)    Physical Exam Constitutional:      General: She is not in acute distress.    Appearance: She is well-developed.  HENT:     Head: Normocephalic.     Right Ear: External ear normal.     Left Ear: External ear normal.     Nose: Nose normal.  Eyes:     General:        Right eye: No discharge.        Left eye: No discharge.     Conjunctiva/sclera: Conjunctivae normal.     Pupils: Pupils  are equal, round, and reactive to light.  Neck:     Thyroid: No thyromegaly.     Vascular: No JVD.     Trachea: No tracheal deviation.  Cardiovascular:     Rate and Rhythm: Normal rate and regular rhythm.     Heart sounds: Normal heart sounds.  Pulmonary:     Effort: No respiratory distress.     Breath sounds: No stridor. No wheezing.  Abdominal:     General: Bowel sounds are normal. There is no distension.     Palpations: Abdomen is soft. There is no mass.     Tenderness: There is no abdominal tenderness. There is no guarding or rebound.  Musculoskeletal:        General: Tenderness present.     Cervical back: Normal range of motion and neck supple.  Lymphadenopathy:     Cervical: No cervical adenopathy.  Skin:    Findings: No erythema or rash.  Neurological:     Cranial Nerves: No cranial nerve  deficit.     Motor: No abnormal muscle tone.     Coordination: Coordination normal.     Deep Tendon Reflexes: Reflexes normal.  Psychiatric:        Behavior: Behavior normal.        Thought Content: Thought content normal.        Judgment: Judgment normal.   LS tender  Lab Results  Component Value Date   WBC 5.5 11/18/2019   HGB 13.5 11/18/2019   HCT 38.7 11/18/2019   PLT 238.0 11/18/2019   GLUCOSE 95 11/18/2019   CHOL 204 (H) 11/18/2019   TRIG 55.0 11/18/2019   HDL 101.60 11/18/2019   LDLCALC 91 11/18/2019   ALT 15 11/18/2019   AST 28 11/18/2019   NA 136 11/18/2019   K 4.5 11/18/2019   CL 99 11/18/2019   CREATININE 0.83 11/18/2019   BUN 14 11/18/2019   CO2 30 11/18/2019   TSH 5.05 (H) 02/15/2020    CT CARDIAC SCORING  Addendum Date: 10/19/2018   ADDENDUM REPORT: 10/19/2018 16:45 CLINICAL DATA:  Risk stratification EXAM: Coronary Calcium Score TECHNIQUE: The patient was scanned on a [employer] scanner. Axial non-contrast 3 mm slices were carried out through the heart. The data set was analyzed on a dedicated work station and scored using the [place]. FINDINGS: Non-cardiac: See separate report from [facility]. Ascending Aorta: Normal size, no calcifications. Pericardium: Normal. Coronary arteries: Normal origin. IMPRESSION: Coronary calcium score of 3. This was 53 percentile for age and sex matched control. Electronically Signed   By: Ena Dawley   On: 10/19/2018 16:45   Result Date: 10/19/2018 EXAM: OVER-READ INTERPRETATION  CT CHEST The following report is an over-read performed by radiologist Dr. Rolm Baptise of [facility], [place] on 10/19/2018. This over-read does not include interpretation of cardiac or coronary anatomy or pathology. The coronary calcium score interpretation by the cardiologist is attached. COMPARISON:  PET CT 10/27/2005 FINDINGS: Vascular: Heart is normal size.  Aorta is normal caliber. Mediastinum/Nodes: No adenopathy in the lower  mediastinum or hila. Lungs/Pleura: 13 mm nodule in the lingula, increased slightly in size 2007 when this measured 10 mm. Linear scarring in the lung bases. No effusions. Calcified granuloma in the left lower lobe. Upper Abdomen: Imaging into the upper abdomen shows no acute findings. Musculoskeletal: Chest wall soft tissues are unremarkable. No acute bony abnormality. IMPRESSION: 13 mm nodule in the lingula, slightly increased in size from 10 mm in 2007. This was shown on  prior PET CT to be most compatible with a benign nodule. No acute extra cardiac abnormality. Electronically Signed: By: Rolm Baptise M.D. On: 10/19/2018 16:10    Assessment & Plan:    Walker Kehr, MD

## 2020-08-14 NOTE — Assessment & Plan Note (Signed)
On po abx 

## 2020-08-15 LAB — URINALYSIS
Bilirubin Urine: NEGATIVE
Hgb urine dipstick: NEGATIVE
Ketones, ur: NEGATIVE
Leukocytes,Ua: NEGATIVE
Nitrite: NEGATIVE
Specific Gravity, Urine: 1.025 (ref 1.000–1.030)
Total Protein, Urine: NEGATIVE
Urine Glucose: NEGATIVE
Urobilinogen, UA: 0.2 (ref 0.0–1.0)
pH: 6 (ref 5.0–8.0)

## 2020-08-16 LAB — DRUG MONITORING, PANEL 8 WITH CONFIRMATION, URINE
6 Acetylmorphine: NEGATIVE ng/mL (ref <10)
Alcohol Metabolites: POSITIVE ng/mL — AB
Amphetamines: NEGATIVE ng/mL (ref <500)
Benzodiazepines: NEGATIVE ng/mL (ref <100)
Buprenorphine, Urine: NEGATIVE ng/mL (ref <5)
Cocaine Metabolite: NEGATIVE ng/mL (ref <150)
Codeine: NEGATIVE ng/mL (ref <50)
Creatinine: 99.2 mg/dL
Ethyl Glucuronide (ETG): 31063 ng/mL — ABNORMAL HIGH (ref <500)
Ethyl Sulfate (ETS): 5252 ng/mL — ABNORMAL HIGH (ref <100)
Hydrocodone: NEGATIVE ng/mL (ref <50)
Hydromorphone: NEGATIVE ng/mL (ref <50)
MDMA: NEGATIVE ng/mL (ref <500)
Marijuana Metabolite: NEGATIVE ng/mL (ref <20)
Morphine: NEGATIVE ng/mL (ref <50)
Norhydrocodone: NEGATIVE ng/mL (ref <50)
Noroxycodone: >10000 ng/mL — ABNORMAL HIGH (ref <50)
Opiates: NEGATIVE ng/mL (ref <100)
Oxidant: NEGATIVE ug/mL
Oxycodone: >10000 ng/mL — ABNORMAL HIGH (ref <50)
Oxycodone: POSITIVE ng/mL — AB (ref <100)
Oxymorphone: >10000 ng/mL — ABNORMAL HIGH (ref <50)
pH: 5.5 (ref 4.5–9.0)

## 2020-08-16 LAB — DM TEMPLATE

## 2020-08-24 ENCOUNTER — Encounter: Payer: Self-pay | Admitting: Internal Medicine

## 2020-08-24 ENCOUNTER — Telehealth (INDEPENDENT_AMBULATORY_CARE_PROVIDER_SITE_OTHER): Payer: Medicare HMO | Admitting: Internal Medicine

## 2020-08-24 DIAGNOSIS — J0101 Acute recurrent maxillary sinusitis: Secondary | ICD-10-CM | POA: Diagnosis not present

## 2020-08-24 MED ORDER — AMOXICILLIN-POT CLAVULANATE 875-125 MG PO TABS: 1.0000 | ORAL_TABLET | Freq: Two times a day (BID) | ORAL | 0 refills | Status: DC

## 2020-08-24 NOTE — Progress Notes (Signed)
Virtual Visit via Audio Note  I connected with [name] on 08/24/20 at  3:40 PM EST by an audio-only enabled telemedicine application and verified that I am speaking with the correct person using two identifiers.  The patient and the provider were at separate locations throughout the entire encounter. Patient location: home, Provider location: work   I discussed the limitations of evaluation and management by telemedicine and the availability of in person appointments. The patient expressed understanding and agreed to proceed. The patient and the provider were the only parties present for the visit unless noted in HPI below.  History of Present Illness: The patient is a 59 y.o. female with visit for sinus problems. z-pack Nov 22 to 26th and started hurting yesterday in the sinuses. Denies fevers or chills. She has had sinus infections in the past and typically they take several different courses of antibiotics and often go into her chest. Has not been vaccinated against covid but in the course of last 3 weeks of symptoms took 3 covid-19 tests which were all negative. Denies muscle aches or change in taste or smell. Overall it is worsening.  Observations/Objective: A and O times 3, no coughing during visit, no dyspnea and talking in full sentences  Assessment and Plan: See problem oriented charting  Follow Up Instructions: rx augmentin and start flonase which she has at home  Visit time 12 minutes in non-face to face communication with patient and coordination of care    I discussed the assessment and treatment plan with the patient. The patient was provided an opportunity to ask questions and all were answered. The patient agreed with the plan and demonstrated an understanding of the instructions.   The patient was advised to call back or seek an in-person evaluation if the symptoms worsen or if the condition fails to improve as anticipated.  Hoyt Koch, MD

## 2020-08-24 NOTE — Assessment & Plan Note (Signed)
Rx augmentin. Continue flonase and zyrtec. Call back if no improvement. Advised as soon as feeling well needs to get covid-19 vaccine and she agrees and wants to get as soon as well.

## 2020-09-04 ENCOUNTER — Telehealth (INDEPENDENT_AMBULATORY_CARE_PROVIDER_SITE_OTHER): Payer: Medicare HMO | Admitting: Family

## 2020-09-04 DIAGNOSIS — J329 Chronic sinusitis, unspecified: Secondary | ICD-10-CM | POA: Diagnosis not present

## 2020-09-04 MED ORDER — PREDNISONE 20 MG PO TABS: 40.0000 mg | ORAL_TABLET | Freq: Every day | ORAL | 0 refills | Status: DC

## 2020-09-04 NOTE — Progress Notes (Signed)
Abigail Wilson is a 59 y.o. female with the following history as recorded in [employer]:  Patient Active Problem List   Diagnosis Date Noted  . Coronary atherosclerosis 11/11/2018  . Chronic pain syndrome 05/07/2018  . Edema 04/22/2017  . History of hepatitis C 07/22/2016  . Maxillary sinusitis, acute 01/04/2016  . Loss of weight 01/02/2015  . Benign lipomatous neoplasm of skin and subcutaneous tissue of left leg 10/02/2014  . Osteopenia 03/31/2014  . Mass of foot or toe 11/28/2013  . Well adult exam 12/09/2011  . History of hepatitis B 12/09/2011  . Low blood pressure, not hypotension 04/21/2011  . BLEPHARITIS 10/07/2010  . Acute upper respiratory infection 08/13/2010  . GOITER, MULTINODULAR 03/19/2010  . Hypothyroidism 03/19/2010  . ACUTE LYMPHADENITIS 03/15/2010  . TOBACCO USE, QUIT 07/25/2009  . COPD mixed type ([place]) 05/04/2009  . ROSACEA 03/14/2009  . 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  . ABDOMINAL PAIN 10/31/2008  . PNEUMONIA, ORGANISM UNSPECIFIED 05/29/2008  . BRONCHITIS, ACUTE 01/11/2008  . Depression 06/04/2007  . OSTEOARTHRITIS 06/04/2007  . LOW BACK PAIN 06/04/2007  . OSTEOPOROSIS 03/27/2007    Current Outpatient Medications  Medication Sig Dispense Refill  . albuterol (PROVENTIL HFA;VENTOLIN HFA) 108 (90 Base) MCG/ACT inhaler Inhale 1-2 puffs into the lungs every 6 (six) hours as needed for wheezing or shortness of breath. 8.5 each 1  . aspirin 81 MG EC tablet Take 81 mg by mouth daily.      . Cholecalciferol 3000 units TABS Take 2 tablets by mouth daily.    [name] COENZYME Q-10 PO Take 200 mg by mouth daily.     . fluticasone (FLONASE) 50 MCG/ACT nasal spray Place 2 sprays into both nostrils daily. 16 g 11  . furosemide (LASIX) 40 MG tablet TAKE 1 TABLET BY MOUTH EVERY DAY 90 tablet 3  . KLOR-CON M20 20 MEQ tablet TAKE 1 TABLET BY MOUTH EVERY DAY 90 tablet 3  . levothyroxine (SYNTHROID) 100 MCG tablet Take 1 tablet (100 mcg total) by mouth daily. 90 tablet 3  . oxyCODONE  (OXYCONTIN) 40 mg 12 hr tablet Take 1 tablet (40 mg total) by mouth every 6 (six) hours. Please fill on or after 08/16/20 120 tablet 0  . oxyCODONE (OXYCONTIN) 40 mg 12 hr tablet Take 1 tablet (40 mg total) by mouth every 6 (six) hours. 120 tablet 0  . oxyCODONE (OXYCONTIN) 40 mg 12 hr tablet Take 1 tablet (40 mg total) by mouth every 6 (six) hours. 120 tablet 0  . oxyCODONE (OXYCONTIN) 80 mg 12 hr tablet Take 1 tablet (80 mg total) by mouth every 6 (six) hours. 120 tablet 0  . oxyCODONE (OXYCONTIN) 80 mg 12 hr tablet Take 1 tablet (80 mg total) by mouth every 6 (six) hours. 120 tablet 0  . oxyCODONE (OXYCONTIN) 80 mg 12 hr tablet Take 1 tablet (80 mg total) by mouth every 6 (six) hours. 120 tablet 0  . predniSONE (DELTASONE) 20 MG tablet Take 2 tablets (40 mg total) by mouth daily with breakfast. 10 tablet 0  . Red Yeast Rice 600 MG CAPS 1 po qd 100 capsule 3   No current facility-administered medications for this visit.    Allergies: Nsaids and Trazodone hcl  Past Medical History:  Diagnosis Date  . Asthmatic bronchitis    Mild  . Cataract   . Depression   . Hepatitis C   . Hyperthyroidism    s/p 131Iodine Rx 2007  . Hypothyroidism   . LBP (low back pain)   .  Menopause   . OA (osteoarthritis of spine)   . OA (osteoarthritis)   . Osteoporosis     Past Surgical History:  Procedure Laterality Date  . CESAREAN SECTION    . COLONOSCOPY W/ POLYPECTOMY      Family History  Problem Relation Age of Onset  . Heart disease Father   . Hyperlipidemia Father   . Hypertension Father   . Diabetes Father   . Cancer Father   . Hypertension Other   . Colon cancer Neg Hx   . Pancreatic cancer Neg Hx   . Stomach cancer Neg Hx   . Esophageal cancer Neg Hx     Social History   Tobacco Use  . Smoking status: Former Smoker    Packs/day: 0.25    Types: Cigarettes    Quit date: 03/31/2013    Years since quitting: 7.4  . Smokeless tobacco: Never Used  Substance Use Topics  . Alcohol use:  Yes    Comment: OCC     Subjective:   I connected with [name] on 09/04/20 at 11:20 AM EST by a telephone call and verified that I am speaking with the correct person using two identifiers.   I discussed the limitations of evaluation and management by telemedicine and the availability of in person appointments. The patient expressed understanding and agreed to proceed. Provider in office/ patient is at home; provider and patient are only 2 people on telephone call.   Complaining of persisting/ chronic sinus congestion x 4 weeks; was originally treated with Z-pak and then changed to 10 day course of Augmentin on 12/10; does not feel the Augmentin has helped at all; has taken 4 COVID tests during this time- all negative/ most recent on 12/19; no fever or shortness of breath;      Objective:  There were no vitals filed for this visit.  Lungs: Respirations unlabored;  Neurologic: Alert and oriented; speech intact; face symmetrical;   Assessment:  1. Chronic congestion of paranasal sinus     Plan:  Agree needs in person OV; low suspicion for COVID- she will plan to see her PCP for follow-up next week; will try course of prednisone since she just completed Augmentin; follow-up to be determined;  Time spent 11 minutes  No follow-ups on file.  No orders of the defined types were placed in this encounter.   Requested Prescriptions   Signed Prescriptions Disp Refills  . predniSONE (DELTASONE) 20 MG tablet 10 tablet 0    Sig: Take 2 tablets (40 mg total) by mouth daily with breakfast.

## 2020-09-11 ENCOUNTER — Ambulatory Visit (INDEPENDENT_AMBULATORY_CARE_PROVIDER_SITE_OTHER): Payer: Medicare HMO | Admitting: Internal Medicine

## 2020-09-11 ENCOUNTER — Other Ambulatory Visit: Payer: Self-pay

## 2020-09-11 ENCOUNTER — Encounter: Payer: Self-pay | Admitting: Internal Medicine

## 2020-09-11 DIAGNOSIS — J329 Chronic sinusitis, unspecified: Secondary | ICD-10-CM

## 2020-09-11 MED ORDER — DOXYCYCLINE HYCLATE 100 MG PO TABS: 100.0000 mg | ORAL_TABLET | Freq: Two times a day (BID) | ORAL | 0 refills | Status: DC

## 2020-09-11 MED ORDER — METHYLPREDNISOLONE 4 MG PO TBPK: ORAL_TABLET | ORAL | 0 refills | Status: DC

## 2020-09-11 NOTE — Assessment & Plan Note (Addendum)
Recurrent Doxy x 3 wks if worse Medrol pack Sinus rinse

## 2020-09-11 NOTE — Progress Notes (Signed)
Subjective:  Patient ID: Abigail Wilson, female    DOB: 1961-04-19  Age: 59 y.o. MRN: [id number]  CC: Sinus Problem   HPI Abigail Wilson presents for sinusitis sx's recurrent since November. [name] took Z pack, Augmentin, Prednisone helped.  On Flonase  Outpatient Medications Prior to Visit  Medication Sig Dispense Refill  . albuterol (PROVENTIL HFA;VENTOLIN HFA) 108 (90 Base) MCG/ACT inhaler Inhale 1-2 puffs into the lungs every 6 (six) hours as needed for wheezing or shortness of breath. 8.5 each 1  . aspirin 81 MG EC tablet Take 81 mg by mouth daily.    . Cholecalciferol 3000 units TABS Take 2 tablets by mouth daily.    [name] COENZYME Q-10 PO Take 200 mg by mouth daily.    . fluticasone (FLONASE) 50 MCG/ACT nasal spray Place 2 sprays into both nostrils daily. 16 g 11  . furosemide (LASIX) 40 MG tablet TAKE 1 TABLET BY MOUTH EVERY DAY 90 tablet 3  . KLOR-CON M20 20 MEQ tablet TAKE 1 TABLET BY MOUTH EVERY DAY 90 tablet 3  . levothyroxine (SYNTHROID) 100 MCG tablet Take 1 tablet (100 mcg total) by mouth daily. 90 tablet 3  . oxyCODONE (OXYCONTIN) 40 mg 12 hr tablet Take 1 tablet (40 mg total) by mouth every 6 (six) hours. Please fill on or after 08/16/20 120 tablet 0  . oxyCODONE (OXYCONTIN) 40 mg 12 hr tablet Take 1 tablet (40 mg total) by mouth every 6 (six) hours. 120 tablet 0  . oxyCODONE (OXYCONTIN) 40 mg 12 hr tablet Take 1 tablet (40 mg total) by mouth every 6 (six) hours. 120 tablet 0  . oxyCODONE (OXYCONTIN) 80 mg 12 hr tablet Take 1 tablet (80 mg total) by mouth every 6 (six) hours. 120 tablet 0  . oxyCODONE (OXYCONTIN) 80 mg 12 hr tablet Take 1 tablet (80 mg total) by mouth every 6 (six) hours. 120 tablet 0  . oxyCODONE (OXYCONTIN) 80 mg 12 hr tablet Take 1 tablet (80 mg total) by mouth every 6 (six) hours. 120 tablet 0  . predniSONE (DELTASONE) 20 MG tablet Take 2 tablets (40 mg total) by mouth daily with breakfast. 10 tablet 0  . Red Yeast Rice 600 MG CAPS 1 po qd 100  capsule 3   No facility-administered medications prior to visit.    ROS: Review of Systems  Constitutional: Negative for activity change, appetite change, chills, fatigue and unexpected weight change.  HENT: Positive for ear pain, sinus pressure and sinus pain. Negative for congestion and mouth sores.   Eyes: Negative for visual disturbance.  Respiratory: Negative for cough and chest tightness.   Gastrointestinal: Negative for abdominal pain and nausea.  Genitourinary: Negative for difficulty urinating, frequency and vaginal pain.  Musculoskeletal: Negative for back pain and gait problem.  Skin: Negative for pallor and rash.  Neurological: Negative for dizziness, tremors, weakness, numbness and headaches.  Psychiatric/Behavioral: Negative for confusion and sleep disturbance.    Objective:  BP 118/74   Pulse 61   Ht 5' 4.5" (1.638 m)   Wt 127 lb (57.6 kg)   SpO2 99%   BMI 21.46 kg/m   BP Readings from Last 3 Encounters:  09/11/20 118/74  08/14/20 116/70  05/10/20 136/76    Wt Readings from Last 3 Encounters:  09/11/20 127 lb (57.6 kg)  08/14/20 126 lb 12.8 oz (57.5 kg)  05/10/20 124 lb (56.2 kg)    Physical Exam Constitutional:      General: She is not in acute  distress.    Appearance: She is well-developed.  HENT:     Head: Normocephalic.     Right Ear: External ear normal.     Left Ear: External ear normal.     Nose: Congestion present.     Mouth/Throat:     Mouth: Oropharynx is clear and moist.  Eyes:     General:        Right eye: No discharge.        Left eye: No discharge.     Conjunctiva/sclera: Conjunctivae normal.     Pupils: Pupils are equal, round, and reactive to light.  Neck:     Thyroid: No thyromegaly.     Vascular: No JVD.     Trachea: No tracheal deviation.  Cardiovascular:     Rate and Rhythm: Normal rate and regular rhythm.     Heart sounds: Normal heart sounds.  Pulmonary:     Effort: No respiratory distress.     Breath sounds: No  stridor. No wheezing.  Abdominal:     General: Bowel sounds are normal. There is no distension.     Palpations: Abdomen is soft. There is no mass.     Tenderness: There is no abdominal tenderness. There is no guarding or rebound.  Musculoskeletal:        General: No tenderness or edema.     Cervical back: Normal range of motion and neck supple.  Lymphadenopathy:     Cervical: No cervical adenopathy.  Skin:    Findings: No erythema or rash.  Neurological:     Cranial Nerves: No cranial nerve deficit.     Motor: No abnormal muscle tone.     Coordination: Coordination normal.     Deep Tendon Reflexes: Reflexes normal.  Psychiatric:        Mood and Affect: Mood and affect normal.        Behavior: Behavior normal.        Thought Content: Thought content normal.        Judgment: Judgment normal.   B TMs - red  Lab Results  Component Value Date   WBC 5.5 11/18/2019   HGB 13.5 11/18/2019   HCT 38.7 11/18/2019   PLT 238.0 11/18/2019   GLUCOSE 95 11/18/2019   CHOL 204 (H) 11/18/2019   TRIG 55.0 11/18/2019   HDL 101.60 11/18/2019   LDLCALC 91 11/18/2019   ALT 15 11/18/2019   AST 28 11/18/2019   NA 136 11/18/2019   K 4.5 11/18/2019   CL 99 11/18/2019   CREATININE 0.83 11/18/2019   BUN 14 11/18/2019   CO2 30 11/18/2019   TSH 5.05 (H) 02/15/2020    CT CARDIAC SCORING  Addendum Date: 10/19/2018   ADDENDUM REPORT: 10/19/2018 16:45 CLINICAL DATA:  Risk stratification EXAM: Coronary Calcium Score TECHNIQUE: The patient was scanned on a [employer] scanner. Axial non-contrast 3 mm slices were carried out through the heart. The data set was analyzed on a dedicated work station and scored using the [place]. FINDINGS: Non-cardiac: See separate report from [facility]. Ascending Aorta: Normal size, no calcifications. Pericardium: Normal. Coronary arteries: Normal origin. IMPRESSION: Coronary calcium score of 3. This was 20 percentile for age and sex matched control.  Electronically Signed   By: Ena Dawley   On: 10/19/2018 16:45   Result Date: 10/19/2018 EXAM: OVER-READ INTERPRETATION  CT CHEST The following report is an over-read performed by radiologist Dr. Rolm Baptise of [facility], [place] on 10/19/2018. This over-read does not include interpretation  of cardiac or coronary anatomy or pathology. The coronary calcium score interpretation by the cardiologist is attached. COMPARISON:  PET CT 10/27/2005 FINDINGS: Vascular: Heart is normal size.  Aorta is normal caliber. Mediastinum/Nodes: No adenopathy in the lower mediastinum or hila. Lungs/Pleura: 13 mm nodule in the lingula, increased slightly in size 2007 when this measured 10 mm. Linear scarring in the lung bases. No effusions. Calcified granuloma in the left lower lobe. Upper Abdomen: Imaging into the upper abdomen shows no acute findings. Musculoskeletal: Chest wall soft tissues are unremarkable. No acute bony abnormality. IMPRESSION: 13 mm nodule in the lingula, slightly increased in size from 10 mm in 2007. This was shown on prior PET CT to be most compatible with a benign nodule. No acute extra cardiac abnormality. Electronically Signed: By: Rolm Baptise M.D. On: 10/19/2018 16:10    Assessment & Plan:    Follow-up: No follow-ups on file.  Walker Kehr, MD

## 2020-09-12 ENCOUNTER — Encounter: Payer: Self-pay | Admitting: Internal Medicine

## 2020-09-21 ENCOUNTER — Telehealth: Payer: Self-pay | Admitting: Infectious Diseases

## 2020-09-21 NOTE — Telephone Encounter (Signed)
A user error has taken place: encounter opened in error, closed for administrative reasons.

## 2020-10-31 ENCOUNTER — Ambulatory Visit: Payer: Medicare HMO

## 2020-11-12 ENCOUNTER — Ambulatory Visit (INDEPENDENT_AMBULATORY_CARE_PROVIDER_SITE_OTHER): Payer: Medicare HMO

## 2020-11-12 ENCOUNTER — Other Ambulatory Visit: Payer: Self-pay

## 2020-11-12 VITALS — BP 110/70 | HR 74 | Temp 98.3°F | Resp 16 | Ht 65.0 in | Wt 134.2 lb

## 2020-11-12 DIAGNOSIS — Z0000 Encounter for general adult medical examination without abnormal findings: Secondary | ICD-10-CM | POA: Diagnosis not present

## 2020-11-12 NOTE — Progress Notes (Addendum)
Subjective:   Abigail Wilson is a 60 y.o. female who presents for Medicare Annual (Subsequent) preventive examination.  Review of Systems    No ROS. Medicare Wellness Visit. Additional risk factors are reflected in social history. Cardiac Risk Factors include: family history of premature cardiovascular disease     Objective:    Today's Vitals   11/12/20 1445  BP: 110/70  Pulse: 74  Resp: 16  Temp: 98.3 F (36.8 C)  SpO2: 96%  Weight: 134 lb 3.2 oz (60.9 kg)  Height: 5\' 5"  (1.651 m)  PainSc: 0-No pain   Body mass index is 22.33 kg/m.  Advanced Directives 11/12/2020 03/27/2017 03/13/2017  Does Patient Have a Medical Advance Directive? No No No  Would patient like information on creating a medical advance directive? No - Patient declined - -    Current Medications (verified) Outpatient Encounter Medications as of 11/12/2020  Medication Sig   albuterol (PROVENTIL HFA;VENTOLIN HFA) 108 (90 Base) MCG/ACT inhaler Inhale 1-2 puffs into the lungs every 6 (six) hours as needed for wheezing or shortness of breath.   aspirin 81 MG EC tablet Take 81 mg by mouth daily.   Cholecalciferol 3000 units TABS Take 2 tablets by mouth daily.   COENZYME Q-10 PO Take 200 mg by mouth daily.   doxycycline (VIBRA-TABS) 100 MG tablet Take 1 tablet (100 mg total) by mouth 2 (two) times daily.   fluticasone (FLONASE) 50 MCG/ACT nasal spray Place 2 sprays into both nostrils daily.   furosemide (LASIX) 40 MG tablet TAKE 1 TABLET BY MOUTH EVERY DAY   KLOR-CON M20 20 MEQ tablet TAKE 1 TABLET BY MOUTH EVERY DAY   levothyroxine (SYNTHROID) 100 MCG tablet Take 1 tablet (100 mcg total) by mouth daily.   methylPREDNISolone (MEDROL DOSEPAK) 4 MG TBPK tablet As directed   oxyCODONE (OXYCONTIN) 40 mg 12 hr tablet Take 1 tablet (40 mg total) by mouth every 6 (six) hours. Please fill on or after 08/16/20   oxyCODONE (OXYCONTIN) 40 mg 12 hr tablet Take 1 tablet (40 mg total) by mouth every 6 (six) hours.    oxyCODONE (OXYCONTIN) 40 mg 12 hr tablet Take 1 tablet (40 mg total) by mouth every 6 (six) hours.   oxyCODONE (OXYCONTIN) 80 mg 12 hr tablet Take 1 tablet (80 mg total) by mouth every 6 (six) hours.   oxyCODONE (OXYCONTIN) 80 mg 12 hr tablet Take 1 tablet (80 mg total) by mouth every 6 (six) hours.   oxyCODONE (OXYCONTIN) 80 mg 12 hr tablet Take 1 tablet (80 mg total) by mouth every 6 (six) hours.   predniSONE (DELTASONE) 20 MG tablet Take 2 tablets (40 mg total) by mouth daily with breakfast.   Red Yeast Rice 600 MG CAPS 1 po qd   [DISCONTINUED] potassium chloride (KLOR-CON) 20 MEQ packet Take 20 mEq by mouth daily.     No facility-administered encounter medications on file as of 11/12/2020.    Allergies (verified) Nsaids and Trazodone hcl   History: Past Medical History:  Diagnosis Date   Asthmatic bronchitis    Mild   Cataract    Depression    Hepatitis C    Hyperthyroidism    s/p 131Iodine Rx 2007   Hypothyroidism    LBP (low back pain)    Menopause    OA (osteoarthritis of spine)    OA (osteoarthritis)    Osteoporosis    Past Surgical History:  Procedure Laterality Date   CESAREAN SECTION     COLONOSCOPY  W/ POLYPECTOMY     Family History  Problem Relation Age of Onset   Heart disease Father    Hyperlipidemia Father    Hypertension Father    Diabetes Father    Cancer Father    Hypertension Other    Colon cancer Neg Hx    Pancreatic cancer Neg Hx    Stomach cancer Neg Hx    Esophageal cancer Neg Hx    Social History   Socioeconomic History   Marital status: Married    Spouse name: Not on file   Number of children: Not on file   Years of education: Not on file   Highest education level: Not on file  Occupational History   Occupation: retired    Fish farm manager: UNEMPLOYED  Tobacco Use   Smoking status: Former Smoker    Packs/day: 0.25    Types: Cigarettes    Quit date: 03/31/2013    Years since quitting: 7.6   Smokeless tobacco: Never Used  Vaping Use    Vaping Use: Some days  Substance and Sexual Activity   Alcohol use: Not Currently    Comment: OCC    Drug use: No   Sexual activity: Not Currently    Comment: 1st intercourse- 15, partners- 64, married- 12 yrs   Other Topics Concern   Not on file  Social History Narrative   Not on file   Social Determinants of Health   Financial Resource Strain: Low Risk    Difficulty of Paying Living Expenses: Not hard at all  Food Insecurity: No Food Insecurity   Worried About Charity fundraiser in the Last Year: Never true   Arboriculturist in the Last Year: Never true  Transportation Needs: No Transportation Needs   Lack of Transportation (Medical): No   Lack of Transportation (Non-Medical): No  Physical Activity: Sufficiently Active   Days of Exercise per Week: 5 days   Minutes of Exercise per Session: 30 min  Stress: No Stress Concern Present   Feeling of Stress : Not at all  Social Connections: Socially Integrated   Frequency of Communication with Friends and Family: More than three times a week   Frequency of Social Gatherings with Friends and Family: Once a week   Attends Religious Services: More than 4 times per year   Active Member of [employer] or Organizations: Yes   Attends Music therapist: More than 4 times per year   Marital Status: Married    Tobacco Counseling Counseling given: Not Answered   Clinical Intake:  Pre-visit preparation completed: Yes  Pain : No/denies pain Pain Score: 0-No pain     BMI - recorded: 22.33 Nutritional Status: BMI of 19-24  Normal Nutritional Risks: None Diabetes: No  How often do you need to have someone help you when you read instructions, pamphlets, or other written materials from your doctor or pharmacy?: 1 - Never What is the last grade level you completed in school?: LPN Dipolma  Diabetic? no  Interpreter Needed?: No  Information entered by :: Lisette Abu, LPN   Activities of Daily Living In your present  state of health, do you have any difficulty performing the following activities: 11/12/2020  Hearing? N  Vision? N  Difficulty concentrating or making decisions? N  Walking or climbing stairs? N  Dressing or bathing? N  Doing errands, shopping? N  Preparing Food and eating ? N  Using the Toilet? N  In the past six months, have you accidently leaked urine?  N  Do you have problems with loss of bowel control? N  Managing your Medications? N  Managing your Finances? N  Housekeeping or managing your Housekeeping? N  Some recent data might be hidden    Patient Care Team: Plotnikov, Evie Lacks, MD as PCP - General Renato Shin, MD as Attending Physician (Endocrinology) Terrance Mass, MD (Inactive) (Obstetrics and Gynecology) Ladene Artist, MD (Gastroenterology)  Indicate any recent Medical Services you may have received from other than [facility] providers in the past year (date may be approximate).     Assessment:   This is a routine wellness examination for Abigail Wilson.  Hearing/Vision screen No exam data present  Dietary issues and exercise activities discussed: Current Exercise Habits: Structured exercise class, Type of exercise: treadmill;stretching;strength training/weights;walking, Time (Minutes): 30, Frequency (Times/Week): 5, Weekly Exercise (Minutes/Week): 150, Intensity: Moderate, Exercise limited by: None identified  Goals      Patient Stated     To get better control of my thyroid condition.       Depression Screen PHQ 2/9 Scores 11/12/2020 08/14/2020 04/23/2017 01/20/2017 07/16/2015  PHQ - 2 Score 0 0 1 0 0  PHQ- 9 Score - - 2 2 -    Fall Risk Fall Risk  11/12/2020  Falls in the past year? 0  Number falls in past yr: 0  Injury with Fall? 0  Risk for fall due to : No Fall Risks    FALL RISK PREVENTION PERTAINING TO THE HOME:  Any stairs in or around the home? Yes  If so, are there any without handrails? No  Home free of loose throw rugs in walkways, pet beds,  electrical cords, etc? Yes  Adequate lighting in your home to reduce risk of falls? Yes   ASSISTIVE DEVICES UTILIZED TO PREVENT FALLS:  Life alert? No  Use of a cane, walker or w/c? No  Grab bars in the bathroom? No  Shower chair or bench in shower? No  Elevated toilet seat or a handicapped toilet? No   TIMED UP AND GO:  Was the test performed? No .  Length of time to ambulate 10 feet: 0 sec.   Gait steady and fast without use of assistive device  Cognitive Function: Normal cognitive status assessed by direct observation by this Nurse Health Advisor. No abnormalities found.          Immunizations Immunization History  Administered Date(s) Administered   H1N1 08/22/2008   Influenza Split 07/22/2011, 07/02/2012   Influenza Whole 06/13/2009, 06/28/2010   Influenza, High Dose Seasonal PF 06/16/2016, 06/23/2017, 07/05/2018   Influenza,inj,Quad PF,6+ Mos 07/19/2013, 07/17/2014, 07/16/2015, 05/16/2019, 07/14/2020   Pneumococcal Conjugate-13 10/02/2014   Pneumococcal Polysaccharide-23 12/09/2011   Td 04/28/2002   Tdap 01/02/2015    TDAP status: Up to date  Flu Vaccine status: Up to date  Pneumococcal vaccine status: Up to date  Covid-19 vaccine status: Declined, Education has been provided regarding the importance of this vaccine but patient still declined. Advised may receive this vaccine at local pharmacy or Health Dept.or vaccine clinic. Aware to provide a copy of the vaccination record if obtained from local pharmacy or Health Dept. Verbalized acceptance and understanding.  Qualifies for Shingles Vaccine? Yes   Zostavax completed No   Shingrix Completed?: No.    Education has been provided regarding the importance of this vaccine. Patient has been advised to call insurance company to determine out of pocket expense if they have not yet received this vaccine. Advised may also receive vaccine at local  pharmacy or Health Dept. Verbalized acceptance and  understanding.  Screening Tests Health Maintenance  Topic Date Due   COVID-19 Vaccine (1) Never done   HIV Screening  Never done   MAMMOGRAM  04/18/2016   PAP SMEAR-Modifier  01/25/2021   COLONOSCOPY (Pts 45-50yrs Insurance coverage will need to be confirmed)  03/27/2022   TETANUS/TDAP  01/01/2025   INFLUENZA VACCINE  Completed   Hepatitis C Screening  Completed    Health Maintenance  Health Maintenance Due  Topic Date Due   COVID-19 Vaccine (1) Never done   HIV Screening  Never done   MAMMOGRAM  04/18/2016    Colorectal cancer screening: Type of screening: Colonoscopy. Completed 03/27/2017. Repeat every 5 years  Mammogram status: Completed 04/18/2014. Repeat every year  (Patient refused additional breast imaging)  Bone Density status: Completed 01/17/2020. Results reflect: Bone density results: OSTEOPENIA. Repeat every 2 years.  Lung Cancer Screening: (Low Dose CT Chest recommended if Age 70-80 years, 30 pack-year currently smoking OR have quit w/in 15years.) does qualify.   Lung Cancer Screening Referral: no  Additional Screening:  Hepatitis C Screening: does qualify; Completed yes  Vision Screening: Recommended annual ophthalmology exams for early detection of glaucoma and other disorders of the eye. Is the patient up to date with their annual eye exam?  Yes  Who is the provider or what is the name of the office in which the patient attends annual eye exams? Clent Jacks, MD. If pt is not established with a provider, would they like to be referred to a provider to establish care? No .   Dental Screening: Recommended annual dental exams for proper oral hygiene  Community Resource Referral / Chronic Care Management: CRR required this visit?  No   CCM required this visit?  No      Plan:     I have personally reviewed and noted the following in the patient's chart:   Medical and social history Use of alcohol, tobacco or illicit drugs  Current medications and  supplements Functional ability and status Nutritional status Physical activity Advanced directives List of other physicians Hospitalizations, surgeries, and ER visits in previous 12 months Vitals Screenings to include cognitive, depression, and falls Referrals and appointments  In addition, I have reviewed and discussed with patient certain preventive protocols, quality metrics, and best practice recommendations. A written personalized care plan for preventive services as well as general preventive health recommendations were provided to patient.     Sheral Flow, LPN   [id number]   Nurse Notes:  Medications reviewed with patient; opioid use noted.  Medical screening examination/treatment/procedure(s) were performed by non-physician practitioner and as supervising physician I was immediately available for consultation/collaboration.  I agree with above. Lew Dawes, MD

## 2020-11-12 NOTE — Patient Instructions (Addendum)
Abigail Wilson , Thank you for taking time to come for your Medicare Wellness Visit. I appreciate your ongoing commitment to your health goals. Please review the following plan we discussed and let me know if I can assist you in the future.   Screening recommendations/referrals: Colonoscopy: 03/27/2017; due every 5 years Mammogram: 04/18/2014 Bone Density: 01/17/2020; due every 2 years Recommended yearly ophthalmology/optometry visit for glaucoma screening and checkup Recommended yearly dental visit for hygiene and checkup  Vaccinations: Influenza vaccine: 07/14/2020 Pneumococcal vaccine: 12/09/2011, 10/02/2014 Tdap vaccine: 01/02/2015 Shingles vaccine: never done  Covid-19: never done   Advanced directives: Advance directive discussed with you today. Even though you declined this today please call our office should you change your mind and we can give you the proper paperwork for you to fill out.  Conditions/risks identified: Yes; Reviewed health maintenance screenings with patient today and relevant education, vaccines, and/or referrals were provided. Please continue to do your personal lifestyle choices by: daily care of teeth and gums, regular physical activity (goal should be 5 days a week for 30 minutes), eat a healthy diet, avoid tobacco and drug use, limiting any alcohol intake, taking a low-dose aspirin (if not allergic or have been advised by your provider otherwise) and taking vitamins and minerals as recommended by your provider. Continue doing brain stimulating activities (puzzles, reading, adult coloring books, staying active) to keep memory sharp. Continue to eat heart healthy diet (full of fruits, vegetables, whole grains, lean protein, water--limit salt, fat, and sugar intake) and increase physical activity as tolerated.  Next appointment: Please schedule your next Medicare Wellness Visit with your Nurse Health Advisor in 1 year by calling [phone].   Preventive Care 40-64 Years,  Female Preventive care refers to lifestyle choices and visits with your health care provider that can promote health and wellness. What does preventive care include?  A yearly physical exam. This is also called an annual well check.  Dental exams once or twice a year.  Routine eye exams. Ask your health care provider how often you should have your eyes checked.  Personal lifestyle choices, including:  Daily care of your teeth and gums.  Regular physical activity.  Eating a healthy diet.  Avoiding tobacco and drug use.  Limiting alcohol use.  Practicing safe sex.  Taking low-dose aspirin daily starting at age 59.  Taking vitamin and mineral supplements as recommended by your health care provider. What happens during an annual well check? The services and screenings done by your health care provider during your annual well check will depend on your age, overall health, lifestyle risk factors, and family history of disease. Counseling  Your health care provider may ask you questions about your:  Alcohol use.  Tobacco use.  Drug use.  Emotional well-being.  Home and relationship well-being.  Sexual activity.  Eating habits.  Work and work Statistician.  Method of birth control.  Menstrual cycle.  Pregnancy history. Screening  You may have the following tests or measurements:  Height, weight, and BMI.  Blood pressure.  Lipid and cholesterol levels. These may be checked every 5 years, or more frequently if you are over 61 years old.  Skin check.  Lung cancer screening. You may have this screening every year starting at age 57 if you have a 30-pack-year history of smoking and currently smoke or have quit within the past 15 years.  Fecal occult blood test (FOBT) of the stool. You may have this test every year starting at age 23.  Flexible  sigmoidoscopy or colonoscopy. You may have a sigmoidoscopy every 5 years or a colonoscopy every 10 years starting at age  20.  Hepatitis C blood test.  Hepatitis B blood test.  Sexually transmitted disease (STD) testing.  Diabetes screening. This is done by checking your blood sugar (glucose) after you have not eaten for a while (fasting). You may have this done every 1-3 years.  Mammogram. This may be done every 1-2 years. Talk to your health care provider about when you should start having regular mammograms. This may depend on whether you have a family history of breast cancer.  BRCA-related cancer screening. This may be done if you have a family history of breast, ovarian, tubal, or peritoneal cancers.  Pelvic exam and Pap test. This may be done every 3 years starting at age 21. Starting at age 56, this may be done every 5 years if you have a Pap test in combination with an HPV test.  Bone density scan. This is done to screen for osteoporosis. You may have this scan if you are at high risk for osteoporosis. Discuss your test results, treatment options, and if necessary, the need for more tests with your health care provider. Vaccines  Your health care provider may recommend certain vaccines, such as:  Influenza vaccine. This is recommended every year.  Tetanus, diphtheria, and acellular pertussis (Tdap, Td) vaccine. You may need a Td booster every 10 years.  Zoster vaccine. You may need this after age 44.  Pneumococcal 13-valent conjugate (PCV13) vaccine. You may need this if you have certain conditions and were not previously vaccinated.  Pneumococcal polysaccharide (PPSV23) vaccine. You may need one or two doses if you smoke cigarettes or if you have certain conditions. Talk to your health care provider about which screenings and vaccines you need and how often you need them. This information is not intended to replace advice given to you by your health care provider. Make sure you discuss any questions you have with your health care provider. Document Released: 09/28/2015 Document Revised:  05/21/2016 Document Reviewed: 07/03/2015 Elsevier Interactive Patient Education  2017 [place] Prevention in the Home Falls can cause injuries. They can happen to people of all ages. There are many things you can do to make your home safe and to help prevent falls. What can I do on the outside of my home?  Regularly fix the edges of walkways and driveways and fix any cracks.  Remove anything that might make you trip as you walk through a door, such as a raised step or threshold.  Trim any bushes or trees on the path to your home.  Use bright outdoor lighting.  Clear any walking paths of anything that might make someone trip, such as rocks or tools.  Regularly check to see if handrails are loose or broken. Make sure that both sides of any steps have handrails.  Any raised decks and porches should have guardrails on the edges.  Have any leaves, snow, or ice cleared regularly.  Use sand or salt on walking paths during winter.  Clean up any spills in your garage right away. This includes oil or grease spills. What can I do in the bathroom?  Use night lights.  Install grab bars by the toilet and in the tub and shower. Do not use towel bars as grab bars.  Use non-skid mats or decals in the tub or shower.  If you need to sit down in the shower, use  a plastic, non-slip stool.  Keep the floor dry. Clean up any water that spills on the floor as soon as it happens.  Remove soap buildup in the tub or shower regularly.  Attach bath mats securely with double-sided non-slip rug tape.  Do not have throw rugs and other things on the floor that can make you trip. What can I do in the bedroom?  Use night lights.  Make sure that you have a light by your bed that is easy to reach.  Do not use any sheets or blankets that are too big for your bed. They should not hang down onto the floor.  Have a firm chair that has side arms. You can use this for support while you get  dressed.  Do not have throw rugs and other things on the floor that can make you trip. What can I do in the kitchen?  Clean up any spills right away.  Avoid walking on wet floors.  Keep items that you use a lot in easy-to-reach places.  If you need to reach something above you, use a strong step stool that has a grab bar.  Keep electrical cords out of the way.  Do not use floor polish or wax that makes floors slippery. If you must use wax, use non-skid floor wax.  Do not have throw rugs and other things on the floor that can make you trip. What can I do with my stairs?  Do not leave any items on the stairs.  Make sure that there are handrails on both sides of the stairs and use them. Fix handrails that are broken or loose. Make sure that handrails are as long as the stairways.  Check any carpeting to make sure that it is firmly attached to the stairs. Fix any carpet that is loose or worn.  Avoid having throw rugs at the top or bottom of the stairs. If you do have throw rugs, attach them to the floor with carpet tape.  Make sure that you have a light switch at the top of the stairs and the bottom of the stairs. If you do not have them, ask someone to add them for you. What else can I do to help prevent falls?  Wear shoes that:  Do not have high heels.  Have rubber bottoms.  Are comfortable and fit you well.  Are closed at the toe. Do not wear sandals.  If you use a stepladder:  Make sure that it is fully opened. Do not climb a closed stepladder.  Make sure that both sides of the stepladder are locked into place.  Ask someone to hold it for you, if possible.  Clearly mark and make sure that you can see:  Any grab bars or handrails.  First and last steps.  Where the edge of each step is.  Use tools that help you move around (mobility aids) if they are needed. These include:  Canes.  Walkers.  Scooters.  Crutches.  Turn on the lights when you go into a  dark area. Replace any light bulbs as soon as they burn out.  Set up your furniture so you have a clear path. Avoid moving your furniture around.  If any of your floors are uneven, fix them.  If there are any pets around you, be aware of where they are.  Review your medicines with your doctor. Some medicines can make you feel dizzy. This can increase your chance of falling. Ask your doctor what  other things that you can do to help prevent falls. This information is not intended to replace advice given to you by your health care provider. Make sure you discuss any questions you have with your health care provider. Document Released: 06/28/2009 Document Revised: 02/07/2016 Document Reviewed: 10/06/2014 Elsevier Interactive Patient Education  2017 [employer].

## 2020-11-13 ENCOUNTER — Encounter: Payer: Self-pay | Admitting: Internal Medicine

## 2020-11-13 ENCOUNTER — Ambulatory Visit (INDEPENDENT_AMBULATORY_CARE_PROVIDER_SITE_OTHER): Payer: Medicare HMO | Admitting: Internal Medicine

## 2020-11-13 VITALS — BP 110/68 | HR 67 | Temp 98.7°F | Ht 64.5 in | Wt 130.0 lb

## 2020-11-13 DIAGNOSIS — G4489 Other headache syndrome: Secondary | ICD-10-CM

## 2020-11-13 DIAGNOSIS — R200 Anesthesia of skin: Secondary | ICD-10-CM | POA: Diagnosis not present

## 2020-11-13 DIAGNOSIS — E890 Postprocedural hypothyroidism: Secondary | ICD-10-CM

## 2020-11-13 DIAGNOSIS — G8929 Other chronic pain: Secondary | ICD-10-CM

## 2020-11-13 DIAGNOSIS — R4189 Other symptoms and signs involving cognitive functions and awareness: Secondary | ICD-10-CM

## 2020-11-13 DIAGNOSIS — E034 Atrophy of thyroid (acquired): Secondary | ICD-10-CM

## 2020-11-13 DIAGNOSIS — J329 Chronic sinusitis, unspecified: Secondary | ICD-10-CM | POA: Diagnosis not present

## 2020-11-13 DIAGNOSIS — M5440 Lumbago with sciatica, unspecified side: Secondary | ICD-10-CM

## 2020-11-13 MED ORDER — OXYCODONE HCL ER 40 MG PO T12A: 40.0000 mg | EXTENDED_RELEASE_TABLET | Freq: Four times a day (QID) | ORAL | 0 refills | Status: DC

## 2020-11-13 MED ORDER — OXYCODONE HCL ER 80 MG PO T12A
80.0000 mg | EXTENDED_RELEASE_TABLET | Freq: Four times a day (QID) | ORAL | 0 refills | Status: DC
Start: 2020-11-13 — End: 2021-02-13

## 2020-11-13 MED ORDER — OXYCODONE HCL ER 40 MG PO T12A
40.0000 mg | EXTENDED_RELEASE_TABLET | Freq: Four times a day (QID) | ORAL | 0 refills | Status: DC
Start: 2020-11-13 — End: 2021-02-13

## 2020-11-13 MED ORDER — OXYCODONE HCL ER 80 MG PO T12A: 80.0000 mg | EXTENDED_RELEASE_TABLET | Freq: Four times a day (QID) | ORAL | 0 refills | Status: DC

## 2020-11-13 NOTE — Assessment & Plan Note (Signed)
FT4, FT3, TSH

## 2020-11-13 NOTE — Assessment & Plan Note (Signed)
Chronic Oxycodone/Oxycontin Rx - long term  Potential benefits of a long term opioids use as well as potential risks (i.e. addiction risk, apnea etc) and complications (i.e. Somnolence, constipation and others) were explained to the patient and were aknowledged.

## 2020-11-13 NOTE — Patient Instructions (Signed)
You can try Lion's Mane Mushroom capsules for memory, focus, neuropathy ( [employer])    

## 2020-11-13 NOTE — Progress Notes (Signed)
Subjective:  Patient ID: Abigail Wilson, female    DOB: Apr 09, 1961  Age: 60 y.o. MRN: [id number]  CC: Follow-up (3 month f/u)   HPI Abigail Wilson presents for LBP, hypothyroidism, edema C/o HA, numbness, R earache. The abx/steroids helped - some sx's remained. C/o R scalp numbness, brain fog.  Outpatient Medications Prior to Visit  Medication Sig Dispense Refill  . albuterol (PROVENTIL HFA;VENTOLIN HFA) 108 (90 Base) MCG/ACT inhaler Inhale 1-2 puffs into the lungs every 6 (six) hours as needed for wheezing or shortness of breath. 8.5 each 1  . aspirin 81 MG EC tablet Take 81 mg by mouth daily.    . Cholecalciferol 3000 units TABS Take 2 tablets by mouth daily.    [name] COENZYME Q-10 PO Take 200 mg by mouth daily.    . fluticasone (FLONASE) 50 MCG/ACT nasal spray Place 2 sprays into both nostrils daily. 16 g 11  . furosemide (LASIX) 40 MG tablet TAKE 1 TABLET BY MOUTH EVERY DAY 90 tablet 3  . levothyroxine (SYNTHROID) 100 MCG tablet Take 1 tablet (100 mcg total) by mouth daily. 90 tablet 3  . oxyCODONE (OXYCONTIN) 40 mg 12 hr tablet Take 1 tablet (40 mg total) by mouth every 6 (six) hours. Please fill on or after 08/16/20 120 tablet 0  . oxyCODONE (OXYCONTIN) 80 mg 12 hr tablet Take 1 tablet (80 mg total) by mouth every 6 (six) hours. 120 tablet 0  . Red Yeast Rice 600 MG CAPS 1 po qd 100 capsule 3  . doxycycline (VIBRA-TABS) 100 MG tablet Take 1 tablet (100 mg total) by mouth 2 (two) times daily. (Patient not taking: Reported on 11/13/2020) 60 tablet 0  . KLOR-CON M20 20 MEQ tablet TAKE 1 TABLET BY MOUTH EVERY DAY (Patient not taking: Reported on 11/13/2020) 90 tablet 3  . methylPREDNISolone (MEDROL DOSEPAK) 4 MG TBPK tablet As directed 21 tablet 0  . oxyCODONE (OXYCONTIN) 40 mg 12 hr tablet Take 1 tablet (40 mg total) by mouth every 6 (six) hours. 120 tablet 0  . oxyCODONE (OXYCONTIN) 40 mg 12 hr tablet Take 1 tablet (40 mg total) by mouth every 6 (six) hours. 120 tablet 0  . oxyCODONE  (OXYCONTIN) 80 mg 12 hr tablet Take 1 tablet (80 mg total) by mouth every 6 (six) hours. 120 tablet 0  . oxyCODONE (OXYCONTIN) 80 mg 12 hr tablet Take 1 tablet (80 mg total) by mouth every 6 (six) hours. 120 tablet 0  . predniSONE (DELTASONE) 20 MG tablet Take 2 tablets (40 mg total) by mouth daily with breakfast. (Patient not taking: Reported on 11/13/2020) 10 tablet 0   No facility-administered medications prior to visit.    ROS: Review of Systems  Constitutional: Negative for activity change, appetite change, chills, fatigue and unexpected weight change.  HENT: Positive for sinus pain. Negative for congestion, mouth sores and sinus pressure.   Eyes: Negative for visual disturbance.  Respiratory: Negative for cough and chest tightness.   Gastrointestinal: Negative for abdominal pain and nausea.  Genitourinary: Negative for difficulty urinating, frequency and vaginal pain.  Musculoskeletal: Positive for arthralgias and back pain. Negative for gait problem.  Skin: Negative for pallor and rash.  Neurological: Positive for numbness. Negative for dizziness, tremors, weakness, light-headedness and headaches.  Psychiatric/Behavioral: Negative for confusion and sleep disturbance. The patient is not nervous/anxious.     Objective:  BP 110/68 (BP Location: Left Arm)   Pulse 67   Temp 98.7 F (37.1 C) (Oral)   Ht  5' 4.5" (1.638 m)   Wt 130 lb (59 kg)   SpO2 98%   BMI 21.97 kg/m   BP Readings from Last 3 Encounters:  11/13/20 110/68  11/12/20 110/70  09/11/20 118/74    Wt Readings from Last 3 Encounters:  11/13/20 130 lb (59 kg)  11/12/20 134 lb 3.2 oz (60.9 kg)  09/11/20 127 lb (57.6 kg)    Physical Exam Constitutional:      General: She is not in acute distress.    Appearance: She is well-developed.  HENT:     Head: Normocephalic.     Right Ear: External ear normal.     Left Ear: External ear normal.     Nose: Nose normal.     Mouth/Throat:     Mouth: Oropharynx is clear  and moist.  Eyes:     General:        Right eye: No discharge.        Left eye: No discharge.     Conjunctiva/sclera: Conjunctivae normal.     Pupils: Pupils are equal, round, and reactive to light.  Neck:     Thyroid: No thyromegaly.     Vascular: No JVD.     Trachea: No tracheal deviation.  Cardiovascular:     Rate and Rhythm: Normal rate and regular rhythm.     Heart sounds: Normal heart sounds.  Pulmonary:     Effort: No respiratory distress.     Breath sounds: No stridor. No wheezing.  Abdominal:     General: Bowel sounds are normal. There is no distension.     Palpations: Abdomen is soft. There is no mass.     Tenderness: There is no abdominal tenderness. There is no guarding or rebound.  Musculoskeletal:        General: Tenderness present. No edema.     Cervical back: Normal range of motion and neck supple.  Lymphadenopathy:     Cervical: No cervical adenopathy.  Skin:    Findings: No erythema or rash.  Neurological:     Cranial Nerves: No cranial nerve deficit.     Motor: No abnormal muscle tone.     Coordination: Coordination normal.     Deep Tendon Reflexes: Reflexes normal.  Psychiatric:        Mood and Affect: Mood and affect normal.        Behavior: Behavior normal.        Thought Content: Thought content normal.        Judgment: Judgment normal.    LS tender   Lab Results  Component Value Date   WBC 5.5 11/18/2019   HGB 13.5 11/18/2019   HCT 38.7 11/18/2019   PLT 238.0 11/18/2019   GLUCOSE 95 11/18/2019   CHOL 204 (H) 11/18/2019   TRIG 55.0 11/18/2019   HDL 101.60 11/18/2019   LDLCALC 91 11/18/2019   ALT 15 11/18/2019   AST 28 11/18/2019   NA 136 11/18/2019   K 4.5 11/18/2019   CL 99 11/18/2019   CREATININE 0.83 11/18/2019   BUN 14 11/18/2019   CO2 30 11/18/2019   TSH 5.05 (H) 02/15/2020    CT CARDIAC SCORING  Addendum Date: 10/19/2018   ADDENDUM REPORT: 10/19/2018 16:45 CLINICAL DATA:  Risk stratification EXAM: Coronary Calcium Score  TECHNIQUE: The patient was scanned on a [employer] scanner. Axial non-contrast 3 mm slices were carried out through the heart. The data set was analyzed on a dedicated work station and scored using the Cyrus. FINDINGS:  Non-cardiac: See separate report from [facility]. Ascending Aorta: Normal size, no calcifications. Pericardium: Normal. Coronary arteries: Normal origin. IMPRESSION: Coronary calcium score of 3. This was 4 percentile for age and sex matched control. Electronically Signed   By: Ena Dawley   On: 10/19/2018 16:45   Result Date: 10/19/2018 EXAM: OVER-READ INTERPRETATION  CT CHEST The following report is an over-read performed by radiologist Dr. Rolm Baptise of [facility], [place] on 10/19/2018. This over-read does not include interpretation of cardiac or coronary anatomy or pathology. The coronary calcium score interpretation by the cardiologist is attached. COMPARISON:  PET CT 10/27/2005 FINDINGS: Vascular: Heart is normal size.  Aorta is normal caliber. Mediastinum/Nodes: No adenopathy in the lower mediastinum or hila. Lungs/Pleura: 13 mm nodule in the lingula, increased slightly in size 2007 when this measured 10 mm. Linear scarring in the lung bases. No effusions. Calcified granuloma in the left lower lobe. Upper Abdomen: Imaging into the upper abdomen shows no acute findings. Musculoskeletal: Chest wall soft tissues are unremarkable. No acute bony abnormality. IMPRESSION: 13 mm nodule in the lingula, slightly increased in size from 10 mm in 2007. This was shown on prior PET CT to be most compatible with a benign nodule. No acute extra cardiac abnormality. Electronically Signed: By: Rolm Baptise M.D. On: 10/19/2018 16:10    Assessment & Plan:     Follow-up: No follow-ups on file.  Walker Kehr, MD

## 2020-11-13 NOTE — Assessment & Plan Note (Signed)
Scalp Check CT head

## 2020-11-14 LAB — COMPREHENSIVE METABOLIC PANEL
ALT: 17 U/L (ref 0–35)
AST: 27 U/L (ref 0–37)
Albumin: 4.2 g/dL (ref 3.5–5.2)
Alkaline Phosphatase: 52 U/L (ref 39–117)
BUN: 15 mg/dL (ref 6–23)
CO2: 31 mEq/L (ref 19–32)
Calcium: 9.7 mg/dL (ref 8.4–10.5)
Chloride: 100 mEq/L (ref 96–112)
Creatinine, Ser: 0.88 mg/dL (ref 0.40–1.20)
GFR: 71.15 mL/min (ref >60.00)
Glucose, Bld: 84 mg/dL (ref 70–99)
Potassium: 4.4 mEq/L (ref 3.5–5.1)
Sodium: 136 mEq/L (ref 135–145)
Total Bilirubin: 0.3 mg/dL (ref 0.2–1.2)
Total Protein: 6.9 g/dL (ref 6.0–8.3)

## 2020-11-15 LAB — T4, FREE: Free T4: 0.85 ng/dL (ref 0.60–1.60)

## 2020-11-15 LAB — T3, FREE: T3, Free: 2.9 pg/mL (ref 2.3–4.2)

## 2020-11-15 LAB — TSH: TSH: 4.87 u[IU]/mL — ABNORMAL HIGH (ref 0.35–4.50)

## 2020-11-19 NOTE — Assessment & Plan Note (Signed)
CT of the head

## 2020-11-23 ENCOUNTER — Ambulatory Visit (INDEPENDENT_AMBULATORY_CARE_PROVIDER_SITE_OTHER)
Admission: RE | Admit: 2020-11-23 | Discharge: 2020-11-23 | Disposition: A | Discharge status: Home / Self Care | Payer: Medicare HMO | Source: Ambulatory Visit | Attending: Internal Medicine | Admitting: Internal Medicine

## 2020-11-23 ENCOUNTER — Other Ambulatory Visit: Payer: Medicare HMO

## 2020-11-23 ENCOUNTER — Other Ambulatory Visit: Payer: Self-pay

## 2020-11-23 DIAGNOSIS — G4489 Other headache syndrome: Secondary | ICD-10-CM | POA: Diagnosis not present

## 2020-11-23 DIAGNOSIS — R200 Anesthesia of skin: Secondary | ICD-10-CM | POA: Diagnosis not present

## 2020-11-23 DIAGNOSIS — J329 Chronic sinusitis, unspecified: Secondary | ICD-10-CM | POA: Diagnosis not present

## 2020-11-23 DIAGNOSIS — R4189 Other symptoms and signs involving cognitive functions and awareness: Secondary | ICD-10-CM

## 2020-11-23 DIAGNOSIS — R519 Headache, unspecified: Secondary | ICD-10-CM | POA: Diagnosis not present

## 2020-11-23 DIAGNOSIS — R42 Dizziness and giddiness: Secondary | ICD-10-CM | POA: Diagnosis not present

## 2020-11-30 ENCOUNTER — Other Ambulatory Visit: Payer: Self-pay | Admitting: *Deleted

## 2020-11-30 MED ORDER — POTASSIUM CHLORIDE 20 MEQ PO PACK: 20.0000 meq | PACK | Freq: Every day | ORAL | 2 refills | Status: DC

## 2020-12-31 ENCOUNTER — Other Ambulatory Visit: Payer: Self-pay | Admitting: Internal Medicine

## 2021-02-06 ENCOUNTER — Other Ambulatory Visit: Payer: Self-pay | Admitting: Internal Medicine

## 2021-02-12 ENCOUNTER — Other Ambulatory Visit: Payer: Self-pay

## 2021-02-13 ENCOUNTER — Encounter: Payer: Self-pay | Admitting: Internal Medicine

## 2021-02-13 ENCOUNTER — Ambulatory Visit (INDEPENDENT_AMBULATORY_CARE_PROVIDER_SITE_OTHER): Payer: Medicare HMO | Admitting: Internal Medicine

## 2021-02-13 DIAGNOSIS — R4189 Other symptoms and signs involving cognitive functions and awareness: Secondary | ICD-10-CM

## 2021-02-13 DIAGNOSIS — G8929 Other chronic pain: Secondary | ICD-10-CM

## 2021-02-13 DIAGNOSIS — I2583 Coronary atherosclerosis due to lipid rich plaque: Secondary | ICD-10-CM | POA: Diagnosis not present

## 2021-02-13 DIAGNOSIS — M5440 Lumbago with sciatica, unspecified side: Secondary | ICD-10-CM

## 2021-02-13 DIAGNOSIS — E034 Atrophy of thyroid (acquired): Secondary | ICD-10-CM | POA: Diagnosis not present

## 2021-02-13 DIAGNOSIS — I2510 Atherosclerotic heart disease of native coronary artery without angina pectoris: Secondary | ICD-10-CM | POA: Diagnosis not present

## 2021-02-13 MED ORDER — OXYCODONE HCL ER 80 MG PO T12A: 80.0000 mg | EXTENDED_RELEASE_TABLET | Freq: Four times a day (QID) | ORAL | 0 refills | Status: DC

## 2021-02-13 MED ORDER — OXYCODONE HCL ER 40 MG PO T12A: 40.0000 mg | EXTENDED_RELEASE_TABLET | Freq: Four times a day (QID) | ORAL | 0 refills | Status: DC

## 2021-02-13 NOTE — Assessment & Plan Note (Signed)
On Levothroid 

## 2021-02-13 NOTE — Progress Notes (Signed)
Subjective:  Patient ID: [name], female    DOB: 04/03/1961  Age: 60 y.o. MRN: [id number]  CC: Follow-up (3 month f/u)   HPI SCHUYLER BEHAN presents for LBP  C/o brain fog sx's after sinus infection x 3 mo  Outpatient Medications Prior to Visit  Medication Sig Dispense Refill  . albuterol (PROVENTIL HFA;VENTOLIN HFA) 108 (90 Base) MCG/ACT inhaler Inhale 1-2 puffs into the lungs every 6 (six) hours as needed for wheezing or shortness of breath. 8.5 each 1  . aspirin 81 MG EC tablet Take 81 mg by mouth daily.    . Cholecalciferol 3000 units TABS Take 2 tablets by mouth daily.    [name] COENZYME Q-10 PO Take 200 mg by mouth daily.    . fluticasone (FLONASE) 50 MCG/ACT nasal spray Place 2 sprays into both nostrils daily. 16 g 11  . furosemide (LASIX) 40 MG tablet TAKE ONE TABLET BY MOUTH DAILY 90 tablet 1  . levothyroxine (SYNTHROID) 100 MCG tablet TAKE ONE TABLET BY MOUTH DAILY 90 tablet 2  . oxyCODONE (OXYCONTIN) 40 mg 12 hr tablet Take 1 tablet (40 mg total) by mouth every 6 (six) hours. 120 tablet 0  . oxyCODONE (OXYCONTIN) 80 mg 12 hr tablet Take 1 tablet (80 mg total) by mouth every 6 (six) hours. 120 tablet 0  . potassium chloride (KLOR-CON) 20 MEQ packet Take 20 mEq by mouth daily. 30 each 2  . Red Yeast Rice 600 MG CAPS 1 po qd 100 capsule 3  . oxyCODONE (OXYCONTIN) 40 mg 12 hr tablet Take 1 tablet (40 mg total) by mouth every 6 (six) hours. 120 tablet 0  . oxyCODONE (OXYCONTIN) 40 mg 12 hr tablet Take 1 tablet (40 mg total) by mouth every 6 (six) hours. Please fill on or after 11/14/20 120 tablet 0  . oxyCODONE (OXYCONTIN) 80 mg 12 hr tablet Take 1 tablet (80 mg total) by mouth every 6 (six) hours. 120 tablet 0  . oxyCODONE (OXYCONTIN) 80 mg 12 hr tablet Take 1 tablet (80 mg total) by mouth every 6 (six) hours. 120 tablet 0   No facility-administered medications prior to visit.    ROS: Review of Systems  Constitutional: Positive for fatigue. Negative for activity  change, appetite change, chills and unexpected weight change.  HENT: Negative for congestion, mouth sores and sinus pressure.   Eyes: Negative for visual disturbance.  Respiratory: Negative for cough and chest tightness.   Gastrointestinal: Negative for abdominal pain and nausea.  Genitourinary: Negative for difficulty urinating, frequency and vaginal pain.  Musculoskeletal: Positive for arthralgias and back pain. Negative for gait problem.  Skin: Negative for pallor and rash.  Neurological: Negative for dizziness, tremors, weakness, numbness and headaches.  Psychiatric/Behavioral: Positive for decreased concentration. Negative for confusion and sleep disturbance. The patient is not nervous/anxious.     Objective:  BP 122/72 (BP Location: Left Arm)   Pulse 69   Temp 98.3 F (36.8 C) (Oral)   Ht 5' 4.5" (1.638 m)   Wt 127 lb 3.2 oz (57.7 kg)   SpO2 96%   BMI 21.50 kg/m   BP Readings from Last 3 Encounters:  02/13/21 122/72  11/13/20 110/68  11/12/20 110/70    Wt Readings from Last 3 Encounters:  02/13/21 127 lb 3.2 oz (57.7 kg)  11/13/20 130 lb (59 kg)  11/12/20 134 lb 3.2 oz (60.9 kg)    Physical Exam Constitutional:      General: She is not in acute distress.  Appearance: She is well-developed.  HENT:     Head: Normocephalic.     Right Ear: External ear normal.     Left Ear: External ear normal.     Nose: Nose normal.  Eyes:     General:        Right eye: No discharge.        Left eye: No discharge.     Conjunctiva/sclera: Conjunctivae normal.     Pupils: Pupils are equal, round, and reactive to light.  Neck:     Thyroid: No thyromegaly.     Vascular: No JVD.     Trachea: No tracheal deviation.  Cardiovascular:     Rate and Rhythm: Normal rate and regular rhythm.     Heart sounds: Normal heart sounds.  Pulmonary:     Effort: No respiratory distress.     Breath sounds: No stridor. No wheezing.  Abdominal:     General: Bowel sounds are normal. There is no  distension.     Palpations: Abdomen is soft. There is no mass.     Tenderness: There is no abdominal tenderness. There is no guarding or rebound.  Musculoskeletal:        General: No tenderness.     Cervical back: Normal range of motion and neck supple.  Lymphadenopathy:     Cervical: No cervical adenopathy.  Skin:    Findings: No erythema or rash.  Neurological:     Mental Status: She is oriented to person, place, and time.     Cranial Nerves: No cranial nerve deficit.     Motor: No abnormal muscle tone.     Coordination: Coordination normal.     Deep Tendon Reflexes: Reflexes normal.  Psychiatric:        Behavior: Behavior normal.        Thought Content: Thought content normal.        Judgment: Judgment normal.     Lab Results  Component Value Date   WBC 5.5 11/18/2019   HGB 13.5 11/18/2019   HCT 38.7 11/18/2019   PLT 238.0 11/18/2019   GLUCOSE 84 11/13/2020   CHOL 204 (H) 11/18/2019   TRIG 55.0 11/18/2019   HDL 101.60 11/18/2019   LDLCALC 91 11/18/2019   ALT 17 11/13/2020   AST 27 11/13/2020   NA 136 11/13/2020   K 4.4 11/13/2020   CL 100 11/13/2020   CREATININE 0.88 11/13/2020   BUN 15 11/13/2020   CO2 31 11/13/2020   TSH 4.87 (H) 11/13/2020    CT Head Wo Contrast  Result Date: 11/25/2020 CLINICAL DATA:  Headache, intracranial hemorrhage suspected Dizziness, non-specific numbness, sinus pain, brain fog. EXAM: CT HEAD WITHOUT CONTRAST TECHNIQUE: Contiguous axial images were obtained from the base of the skull through the vertex without intravenous contrast. COMPARISON:  MRI head 12/09/2023 FINDINGS: Brain: No evidence of large-territorial acute infarction. No parenchymal hemorrhage. No mass lesion. No extra-axial collection. Low lying tonsils are again noted and only partially visualized. No mass effect or midline shift. No hydrocephalus. Basilar cisterns are patent. Vascular: No hyperdense vessel. Skull: No acute fracture or focal lesion. Sinuses/Orbits: Paranasal  sinuses and mastoid air cells are clear. The orbits are unremarkable. Other: None. IMPRESSION: No acute intracranial abnormality. Electronically Signed   By: Iven Finn M.D.   On: 11/25/2020 23:16    Assessment & Plan:    Walker Kehr, MD

## 2021-02-13 NOTE — Assessment & Plan Note (Signed)
Try Lion's mane 

## 2021-02-13 NOTE — Assessment & Plan Note (Signed)
Oxycodone/Oxycontin Rx - long term  Potential benefits of a long term opioids use as well as potential risks (i.e. addiction risk, apnea etc) and complications (i.e. Somnolence, constipation and others) were explained to the patient and were aknowledged.

## 2021-04-09 ENCOUNTER — Telehealth: Payer: Self-pay | Admitting: Internal Medicine

## 2021-04-09 ENCOUNTER — Other Ambulatory Visit: Payer: Self-pay | Admitting: Internal Medicine

## 2021-04-09 NOTE — Telephone Encounter (Signed)
Okay to fill on the 30th. Thanks

## 2021-04-09 NOTE — Telephone Encounter (Signed)
April from [facility] has called in regards to the patient not being able to refill his OXYCODONE until July 31st but that is a Sunday and they are closed. Can they refill it on the 30th or do they have to wait untl the 1st?   Please advise- [phone] EXT [phone] ([facility] location)

## 2021-04-10 NOTE — Telephone Encounter (Signed)
Notified pharmacy spoke w/Abigail Wilson gave MD response.Marland Kitchenlmb

## 2021-05-14 ENCOUNTER — Encounter: Payer: Self-pay | Admitting: Internal Medicine

## 2021-05-14 ENCOUNTER — Ambulatory Visit (INDEPENDENT_AMBULATORY_CARE_PROVIDER_SITE_OTHER): Payer: Medicare HMO | Admitting: Internal Medicine

## 2021-05-14 ENCOUNTER — Other Ambulatory Visit: Payer: Self-pay

## 2021-05-14 VITALS — BP 122/72 | HR 59 | Temp 98.3°F | Ht 64.5 in | Wt 130.0 lb

## 2021-05-14 DIAGNOSIS — M5440 Lumbago with sciatica, unspecified side: Secondary | ICD-10-CM

## 2021-05-14 DIAGNOSIS — Z23 Encounter for immunization: Secondary | ICD-10-CM

## 2021-05-14 DIAGNOSIS — E034 Atrophy of thyroid (acquired): Secondary | ICD-10-CM

## 2021-05-14 DIAGNOSIS — G8929 Other chronic pain: Secondary | ICD-10-CM | POA: Diagnosis not present

## 2021-05-14 DIAGNOSIS — R4189 Other symptoms and signs involving cognitive functions and awareness: Secondary | ICD-10-CM

## 2021-05-14 MED ORDER — OXYCODONE HCL ER 80 MG PO T12A: 80.0000 mg | EXTENDED_RELEASE_TABLET | Freq: Four times a day (QID) | ORAL | 0 refills | Status: DC

## 2021-05-14 MED ORDER — OXYCODONE HCL ER 40 MG PO T12A: 40.0000 mg | EXTENDED_RELEASE_TABLET | Freq: Four times a day (QID) | ORAL | 0 refills | Status: DC

## 2021-05-14 NOTE — Progress Notes (Signed)
Subjective:  Patient ID: Abigail Wilson, female    DOB: 12/21/1960  Age: 60 y.o. MRN: [id number]  CC: Follow-up (3 month f/u)   HPI Abigail Wilson presents for LBP, hypothyroidism, osteopenia f/u Abigail Wilson - GS - 60 yo  Outpatient Medications Prior to Visit  Medication Sig Dispense Refill   albuterol (PROVENTIL HFA;VENTOLIN HFA) 108 (90 Base) MCG/ACT inhaler Inhale 1-2 puffs into the lungs every 6 (six) hours as needed for wheezing or shortness of breath. 8.5 each 1   aspirin 81 MG EC tablet Take 81 mg by mouth daily.     Cholecalciferol 3000 units TABS Take 2 tablets by mouth daily.     COENZYME Q-10 PO Take 200 mg by mouth daily.     fluticasone (FLONASE) 50 MCG/ACT nasal spray Place 2 sprays into both nostrils daily. 16 g 11   furosemide (LASIX) 40 MG tablet TAKE ONE TABLET BY MOUTH DAILY 90 tablet 1   levothyroxine (SYNTHROID) 100 MCG tablet TAKE ONE TABLET BY MOUTH DAILY 90 tablet 2   oxyCODONE (OXYCONTIN) 40 mg 12 hr tablet Take 1 tablet (40 mg total) by mouth every 6 (six) hours. Please fill on or after 04/14/21 120 tablet 0   oxyCODONE (OXYCONTIN) 80 mg 12 hr tablet Take 1 tablet (80 mg total) by mouth every 6 (six) hours. 120 tablet 0   potassium chloride SA (KLOR-CON) 20 MEQ tablet TAKE ONE TABLET BY MOUTH EVERY DAY 30 tablet 2   Red Yeast Rice 600 MG CAPS 1 po qd 100 capsule 3   oxyCODONE (OXYCONTIN) 40 mg 12 hr tablet Take 1 tablet (40 mg total) by mouth every 6 (six) hours. 120 tablet 0   oxyCODONE (OXYCONTIN) 40 mg 12 hr tablet Take 1 tablet (40 mg total) by mouth every 6 (six) hours. 120 tablet 0   oxyCODONE (OXYCONTIN) 80 mg 12 hr tablet Take 1 tablet (80 mg total) by mouth every 6 (six) hours. 120 tablet 0   oxyCODONE (OXYCONTIN) 80 mg 12 hr tablet Take 1 tablet (80 mg total) by mouth every 6 (six) hours. 120 tablet 0   No facility-administered medications prior to visit.    ROS: Review of Systems  Constitutional:  Negative for activity change, appetite change,  chills, fatigue and unexpected weight change.  HENT:  Negative for congestion, mouth sores and sinus pressure.   Eyes:  Negative for visual disturbance.  Respiratory:  Negative for cough and chest tightness.   Gastrointestinal:  Negative for abdominal pain and nausea.  Genitourinary:  Negative for difficulty urinating, frequency and vaginal pain.  Musculoskeletal:  Positive for back pain. Negative for gait problem.  Skin:  Negative for pallor and rash.  Neurological:  Negative for dizziness, tremors, weakness, numbness and headaches.  Psychiatric/Behavioral:  Negative for confusion and sleep disturbance.    Objective:  BP 122/72 (BP Location: Left Arm)   Pulse (!) 59   Temp 98.3 F (36.8 C) (Oral)   Ht 5' 4.5" (1.638 m)   Wt 130 lb (59 kg)   SpO2 97%   BMI 21.97 kg/m   BP Readings from Last 3 Encounters:  05/14/21 122/72  02/13/21 122/72  11/13/20 110/68    Wt Readings from Last 3 Encounters:  05/14/21 130 lb (59 kg)  02/13/21 127 lb 3.2 oz (57.7 kg)  11/13/20 130 lb (59 kg)    Physical Exam Constitutional:      General: She is not in acute distress.    Appearance: She is well-developed.  HENT:     Head: Normocephalic.     Right Ear: External ear normal.     Left Ear: External ear normal.     Nose: Nose normal.  Eyes:     General:        Right eye: No discharge.        Left eye: No discharge.     Conjunctiva/sclera: Conjunctivae normal.     Pupils: Pupils are equal, round, and reactive to light.  Neck:     Thyroid: No thyromegaly.     Vascular: No JVD.     Trachea: No tracheal deviation.  Cardiovascular:     Rate and Rhythm: Normal rate and regular rhythm.     Heart sounds: Normal heart sounds.  Pulmonary:     Effort: No respiratory distress.     Breath sounds: No stridor. No wheezing.  Abdominal:     General: Bowel sounds are normal. There is no distension.     Palpations: Abdomen is soft. There is no mass.     Tenderness: There is no abdominal  tenderness. There is no guarding or rebound.  Musculoskeletal:        General: Tenderness present.     Cervical back: Normal range of motion and neck supple. No rigidity.  Lymphadenopathy:     Cervical: No cervical adenopathy.  Skin:    Findings: No erythema or rash.  Neurological:     Mental Status: She is oriented to person, place, and time.     Cranial Nerves: No cranial nerve deficit.     Motor: No abnormal muscle tone.     Coordination: Coordination normal.     Deep Tendon Reflexes: Reflexes normal.  Psychiatric:        Behavior: Behavior normal.        Thought Content: Thought content normal.        Judgment: Judgment normal.   LS is stiff  Lab Results  Component Value Date   WBC 5.5 11/18/2019   HGB 13.5 11/18/2019   HCT 38.7 11/18/2019   PLT 238.0 11/18/2019   GLUCOSE 84 11/13/2020   CHOL 204 (H) 11/18/2019   TRIG 55.0 11/18/2019   HDL 101.60 11/18/2019   LDLCALC 91 11/18/2019   ALT 17 11/13/2020   AST 27 11/13/2020   NA 136 11/13/2020   K 4.4 11/13/2020   CL 100 11/13/2020   CREATININE 0.88 11/13/2020   BUN 15 11/13/2020   CO2 31 11/13/2020   TSH 4.87 (H) 11/13/2020    CT Head Wo Contrast  Result Date: 11/25/2020 CLINICAL DATA:  Headache, intracranial hemorrhage suspected Dizziness, non-specific numbness, sinus pain, brain fog. EXAM: CT HEAD WITHOUT CONTRAST TECHNIQUE: Contiguous axial images were obtained from the base of the skull through the vertex without intravenous contrast. COMPARISON:  MRI head 12/09/2023 FINDINGS: Brain: No evidence of large-territorial acute infarction. No parenchymal hemorrhage. No mass lesion. No extra-axial collection. Low lying tonsils are again noted and only partially visualized. No mass effect or midline shift. No hydrocephalus. Basilar cisterns are patent. Vascular: No hyperdense vessel. Skull: No acute fracture or focal lesion. Sinuses/Orbits: Paranasal sinuses and mastoid air cells are clear. The orbits are unremarkable.  Other: None. IMPRESSION: No acute intracranial abnormality. Electronically Signed   By: Iven Finn M.D.   On: 11/25/2020 23:16    Assessment & Plan:     Walker Kehr, MD

## 2021-05-15 NOTE — Assessment & Plan Note (Signed)
Continue with Levothroid

## 2021-05-15 NOTE — Assessment & Plan Note (Signed)
Continue with oxycodone/Oxycontin Rx - long term  Potential benefits of a long term opioids use as well as potential risks (i.e. addiction risk, apnea etc) and complications (i.e. Somnolence, constipation and others) were explained to the patient and were aknowledged.

## 2021-05-15 NOTE — Assessment & Plan Note (Signed)
Doing better.   

## 2021-06-28 ENCOUNTER — Other Ambulatory Visit: Payer: Self-pay | Admitting: Internal Medicine

## 2021-07-06 ENCOUNTER — Ambulatory Visit: Payer: Medicare HMO

## 2021-07-06 ENCOUNTER — Other Ambulatory Visit: Payer: Self-pay

## 2021-08-01 ENCOUNTER — Other Ambulatory Visit: Payer: Self-pay

## 2021-08-01 ENCOUNTER — Ambulatory Visit (INDEPENDENT_AMBULATORY_CARE_PROVIDER_SITE_OTHER): Payer: Medicare HMO | Admitting: Internal Medicine

## 2021-08-01 ENCOUNTER — Encounter: Payer: Self-pay | Admitting: Internal Medicine

## 2021-08-01 DIAGNOSIS — E890 Postprocedural hypothyroidism: Secondary | ICD-10-CM

## 2021-08-01 DIAGNOSIS — E034 Atrophy of thyroid (acquired): Secondary | ICD-10-CM | POA: Diagnosis not present

## 2021-08-01 DIAGNOSIS — G8929 Other chronic pain: Secondary | ICD-10-CM | POA: Diagnosis not present

## 2021-08-01 DIAGNOSIS — M5440 Lumbago with sciatica, unspecified side: Secondary | ICD-10-CM

## 2021-08-01 DIAGNOSIS — L03119 Cellulitis of unspecified part of limb: Secondary | ICD-10-CM

## 2021-08-01 MED ORDER — OXYCODONE HCL ER 40 MG PO T12A: 40.0000 mg | EXTENDED_RELEASE_TABLET | Freq: Four times a day (QID) | ORAL | 0 refills | Status: DC

## 2021-08-01 MED ORDER — OXYCODONE HCL ER 80 MG PO T12A: 80.0000 mg | EXTENDED_RELEASE_TABLET | Freq: Four times a day (QID) | ORAL | 0 refills | Status: DC

## 2021-08-01 MED ORDER — ALBUTEROL SULFATE HFA 108 (90 BASE) MCG/ACT IN AERS: 1.0000 | INHALATION_SPRAY | Freq: Four times a day (QID) | RESPIRATORY_TRACT | 1 refills | Status: DC | PRN

## 2021-08-01 MED ORDER — AMOXICILLIN-POT CLAVULANATE 875-125 MG PO TABS: 1.0000 | ORAL_TABLET | Freq: Two times a day (BID) | ORAL | 0 refills | Status: DC

## 2021-08-01 NOTE — Assessment & Plan Note (Signed)
Cont on Levothroid 

## 2021-08-01 NOTE — Assessment & Plan Note (Signed)
Continue with oxycodone/Oxycontin Rx - long term  Potential benefits of a long term opioids use as well as potential risks (i.e. addiction risk, apnea etc) and complications (i.e. Somnolence, constipation and others) were explained to the patient and were aknowledged.

## 2021-08-01 NOTE — Progress Notes (Signed)
Subjective:  Patient ID: Abigail Wilson, female    DOB: 1961/07/29  Age: 60 y.o. MRN: [id number]  CC: 3 month f/u (Pt mentioned that a neighborhood cat bit on her the right hand. She would like her hand to be examined. )   HPI Abigail Wilson presents for R hand was clawed by a cat yesterday F/u LBP, hypothyroidism  Outpatient Medications Prior to Visit  Medication Sig Dispense Refill   aspirin 81 MG EC tablet Take 81 mg by mouth daily.     Cholecalciferol 3000 units TABS Take 2 tablets by mouth daily.     COENZYME Q-10 PO Take 200 mg by mouth daily.     fluticasone (FLONASE) 50 MCG/ACT nasal spray Place 2 sprays into both nostrils daily. 16 g 11   furosemide (LASIX) 40 MG tablet TAKE ONE TABLET BY MOUTH DAILY 90 tablet 1   potassium chloride SA (KLOR-CON) 20 MEQ tablet TAKE ONE TABLET BY MOUTH EVERY DAY 30 tablet 2   Red Yeast Rice 600 MG CAPS 1 po qd 100 capsule 3   albuterol (PROVENTIL HFA;VENTOLIN HFA) 108 (90 Base) MCG/ACT inhaler Inhale 1-2 puffs into the lungs every 6 (six) hours as needed for wheezing or shortness of breath. 8.5 each 1   levothyroxine (SYNTHROID) 100 MCG tablet TAKE ONE TABLET BY MOUTH DAILY 90 tablet 2   oxyCODONE (OXYCONTIN) 40 mg 12 hr tablet Take 1 tablet (40 mg total) by mouth every 6 (six) hours. Please fill on or after 07/15/21 120 tablet 0   oxyCODONE (OXYCONTIN) 40 mg 12 hr tablet Take 1 tablet (40 mg total) by mouth every 6 (six) hours. 120 tablet 0   oxyCODONE (OXYCONTIN) 40 mg 12 hr tablet Take 1 tablet (40 mg total) by mouth every 6 (six) hours. 120 tablet 0   oxyCODONE (OXYCONTIN) 80 mg 12 hr tablet Take 1 tablet (80 mg total) by mouth every 6 (six) hours. 120 tablet 0   oxyCODONE (OXYCONTIN) 80 mg 12 hr tablet Take 1 tablet (80 mg total) by mouth every 6 (six) hours. 120 tablet 0   oxyCODONE (OXYCONTIN) 80 mg 12 hr tablet Take 1 tablet (80 mg total) by mouth every 6 (six) hours. 120 tablet 0   No facility-administered medications prior to  visit.    ROS: Review of Systems  Constitutional:  Negative for activity change, appetite change, chills, fatigue and unexpected weight change.  HENT:  Negative for congestion, mouth sores and sinus pressure.   Eyes:  Negative for visual disturbance.  Respiratory:  Negative for cough and chest tightness.   Gastrointestinal:  Negative for abdominal pain and nausea.  Genitourinary:  Negative for difficulty urinating, frequency and vaginal pain.  Musculoskeletal:  Positive for back pain. Negative for gait problem.  Skin:  Positive for color change and rash. Negative for pallor.  Neurological:  Negative for dizziness, tremors, weakness, numbness and headaches.  Psychiatric/Behavioral:  Negative for confusion and sleep disturbance.    Objective:  BP 114/72   Pulse 82   Resp 18   Ht 5' 4.5" (1.638 m)   Wt 128 lb 12.8 oz (58.4 kg)   SpO2 98%   BMI 21.77 kg/m   BP Readings from Last 3 Encounters:  08/01/21 114/72  05/14/21 122/72  02/13/21 122/72    Wt Readings from Last 3 Encounters:  08/01/21 128 lb 12.8 oz (58.4 kg)  05/14/21 130 lb (59 kg)  02/13/21 127 lb 3.2 oz (57.7 kg)    Physical Exam Constitutional:  General: She is not in acute distress.    Appearance: She is well-developed.  HENT:     Head: Normocephalic.     Right Ear: External ear normal.     Left Ear: External ear normal.     Nose: Nose normal.  Eyes:     General:        Right eye: No discharge.        Left eye: No discharge.     Conjunctiva/sclera: Conjunctivae normal.     Pupils: Pupils are equal, round, and reactive to light.  Neck:     Thyroid: No thyromegaly.     Vascular: No JVD.     Trachea: No tracheal deviation.  Cardiovascular:     Rate and Rhythm: Normal rate and regular rhythm.     Heart sounds: Normal heart sounds.  Pulmonary:     Effort: No respiratory distress.     Breath sounds: No stridor. No wheezing.  Abdominal:     General: Bowel sounds are normal. There is no distension.      Palpations: Abdomen is soft. There is no mass.     Tenderness: There is no abdominal tenderness. There is no guarding or rebound.  Musculoskeletal:        General: Tenderness present.     Cervical back: Normal range of motion and neck supple. No rigidity.  Lymphadenopathy:     Cervical: No cervical adenopathy.  Skin:    General: Skin is warm.     Findings: Erythema and lesion present. No rash.  Neurological:     Cranial Nerves: No cranial nerve deficit.     Motor: No abnormal muscle tone.     Coordination: Coordination normal.     Deep Tendon Reflexes: Reflexes normal.  Psychiatric:        Behavior: Behavior normal.        Thought Content: Thought content normal.        Judgment: Judgment normal.   LS w/pain R post hand w/1 cm scrape and a 4 cm oval erythema w/swelling  Lab Results  Component Value Date   WBC 5.5 11/18/2019   HGB 13.5 11/18/2019   HCT 38.7 11/18/2019   PLT 238.0 11/18/2019   GLUCOSE 84 11/13/2020   CHOL 204 (H) 11/18/2019   TRIG 55.0 11/18/2019   HDL 101.60 11/18/2019   LDLCALC 91 11/18/2019   ALT 17 11/13/2020   AST 27 11/13/2020   NA 136 11/13/2020   K 4.4 11/13/2020   CL 100 11/13/2020   CREATININE 0.88 11/13/2020   BUN 15 11/13/2020   CO2 31 11/13/2020   TSH 4.87 (H) 11/13/2020    CT Head Wo Contrast  Result Date: 11/25/2020 CLINICAL DATA:  Headache, intracranial hemorrhage suspected Dizziness, non-specific numbness, sinus pain, brain fog. EXAM: CT HEAD WITHOUT CONTRAST TECHNIQUE: Contiguous axial images were obtained from the base of the skull through the vertex without intravenous contrast. COMPARISON:  MRI head 12/09/2023 FINDINGS: Brain: No evidence of large-territorial acute infarction. No parenchymal hemorrhage. No mass lesion. No extra-axial collection. Low lying tonsils are again noted and only partially visualized. No mass effect or midline shift. No hydrocephalus. Basilar cisterns are patent. Vascular: No hyperdense vessel. Skull: No  acute fracture or focal lesion. Sinuses/Orbits: Paranasal sinuses and mastoid air cells are clear. The orbits are unremarkable. Other: None. IMPRESSION: No acute intracranial abnormality. Electronically Signed   By: Iven Finn M.D.   On: 11/25/2020 23:16    Assessment & Plan:   Problem List Items  Addressed This Visit     Cellulitis of hand    R hand was clawed by a cat yesterday Augmentin po x 10 d      Hypothyroidism    Cont on Levothroid      Relevant Medications   oxyCODONE (OXYCONTIN) 40 mg 12 hr tablet   oxyCODONE (OXYCONTIN) 40 mg 12 hr tablet   oxyCODONE (OXYCONTIN) 80 mg 12 hr tablet   oxyCODONE (OXYCONTIN) 80 mg 12 hr tablet   oxyCODONE (OXYCONTIN) 80 mg 12 hr tablet   LOW BACK PAIN    Continue with oxycodone/Oxycontin Rx - long term  Potential benefits of a long term opioids use as well as potential risks (i.e. addiction risk, apnea etc) and complications (i.e. Somnolence, constipation and others) were explained to the patient and were aknowledged.      Relevant Medications   oxyCODONE (OXYCONTIN) 40 mg 12 hr tablet   oxyCODONE (OXYCONTIN) 40 mg 12 hr tablet   oxyCODONE (OXYCONTIN) 40 mg 12 hr tablet   oxyCODONE (OXYCONTIN) 80 mg 12 hr tablet   oxyCODONE (OXYCONTIN) 80 mg 12 hr tablet   oxyCODONE (OXYCONTIN) 80 mg 12 hr tablet      Meds ordered this encounter  Medications   amoxicillin-clavulanate (AUGMENTIN) 875-125 MG tablet    Sig: Take 1 tablet by mouth 2 (two) times daily.    Dispense:  20 tablet    Refill:  0   albuterol (VENTOLIN HFA) 108 (90 Base) MCG/ACT inhaler    Sig: Inhale 1-2 puffs into the lungs every 6 (six) hours as needed for wheezing or shortness of breath.    Dispense:  8.5 each    Refill:  1   oxyCODONE (OXYCONTIN) 40 mg 12 hr tablet    Sig: Take 1 tablet (40 mg total) by mouth every 6 (six) hours. Please fill on or after 10/14/21    Dispense:  120 tablet    Refill:  0   oxyCODONE (OXYCONTIN) 40 mg 12 hr tablet    Sig: Take 1  tablet (40 mg total) by mouth every 6 (six) hours.    Dispense:  120 tablet    Refill:  0    Please fill on or after 09/14/21. Dx: M54.5 and G89.4   oxyCODONE (OXYCONTIN) 40 mg 12 hr tablet    Sig: Take 1 tablet (40 mg total) by mouth every 6 (six) hours.    Dispense:  120 tablet    Refill:  0    Please fill on or after 08/15/21. Dx: M54.5   oxyCODONE (OXYCONTIN) 80 mg 12 hr tablet    Sig: Take 1 tablet (80 mg total) by mouth every 6 (six) hours.    Dispense:  120 tablet    Refill:  0    Please fill on or after 10/14/21   oxyCODONE (OXYCONTIN) 80 mg 12 hr tablet    Sig: Take 1 tablet (80 mg total) by mouth every 6 (six) hours.    Dispense:  120 tablet    Refill:  0    Please fill on or after 09/14/2021. DX code:M54.5   oxyCODONE (OXYCONTIN) 80 mg 12 hr tablet    Sig: Take 1 tablet (80 mg total) by mouth every 6 (six) hours.    Dispense:  120 tablet    Refill:  0    Please fill on or after 08/15/21      Follow-up: Return in about 3 months (around 11/01/2021) for a follow-up visit.  Walker Kehr, MD

## 2021-08-01 NOTE — Assessment & Plan Note (Signed)
R hand was clawed by a cat yesterday Augmentin po x 10 d

## 2021-08-05 ENCOUNTER — Other Ambulatory Visit: Payer: Self-pay | Admitting: Internal Medicine

## 2021-08-15 ENCOUNTER — Telehealth: Payer: Self-pay | Admitting: Internal Medicine

## 2021-08-15 NOTE — Telephone Encounter (Unsigned)
Just an FYI.[name]   PA on file for Oxycodone is going to be expiring on 09/14/2021 when the next refill is due.

## 2021-09-06 ENCOUNTER — Emergency Department (HOSPITAL_BASED_OUTPATIENT_CLINIC_OR_DEPARTMENT_OTHER)
Admission: EM | Admit: 2021-09-06 | Discharge: 2021-09-06 | Disposition: A | Discharge status: Home / Self Care | Payer: Medicare HMO | Attending: Emergency Medicine | Admitting: Emergency Medicine

## 2021-09-06 ENCOUNTER — Other Ambulatory Visit: Payer: Self-pay

## 2021-09-06 ENCOUNTER — Encounter (HOSPITAL_BASED_OUTPATIENT_CLINIC_OR_DEPARTMENT_OTHER): Payer: Self-pay | Admitting: Emergency Medicine

## 2021-09-06 DIAGNOSIS — J45909 Unspecified asthma, uncomplicated: Secondary | ICD-10-CM | POA: Insufficient documentation

## 2021-09-06 DIAGNOSIS — Z87891 Personal history of nicotine dependence: Secondary | ICD-10-CM | POA: Diagnosis not present

## 2021-09-06 DIAGNOSIS — E039 Hypothyroidism, unspecified: Secondary | ICD-10-CM | POA: Diagnosis not present

## 2021-09-06 DIAGNOSIS — Z79899 Other long term (current) drug therapy: Secondary | ICD-10-CM | POA: Insufficient documentation

## 2021-09-06 DIAGNOSIS — R197 Diarrhea, unspecified: Secondary | ICD-10-CM | POA: Insufficient documentation

## 2021-09-06 DIAGNOSIS — Z7982 Long term (current) use of aspirin: Secondary | ICD-10-CM | POA: Diagnosis not present

## 2021-09-06 DIAGNOSIS — R140 Abdominal distension (gaseous): Secondary | ICD-10-CM | POA: Insufficient documentation

## 2021-09-06 DIAGNOSIS — R112 Nausea with vomiting, unspecified: Secondary | ICD-10-CM | POA: Diagnosis not present

## 2021-09-06 DIAGNOSIS — Z7951 Long term (current) use of inhaled steroids: Secondary | ICD-10-CM | POA: Insufficient documentation

## 2021-09-06 LAB — CBC
HCT: 39.2 % (ref 36.0–46.0)
Hemoglobin: 13.8 g/dL (ref 12.0–15.0)
MCH: 34 pg (ref 26.0–34.0)
MCHC: 35.2 g/dL (ref 30.0–36.0)
MCV: 96.6 fL (ref 80.0–100.0)
Platelets: 229 10*3/uL (ref 150–400)
RBC: 4.06 MIL/uL (ref 3.87–5.11)
RDW: 13.9 % (ref 11.5–15.5)
WBC: 8.7 10*3/uL (ref 4.0–10.5)
nRBC: 0 % (ref 0.0–0.2)

## 2021-09-06 LAB — URINALYSIS, ROUTINE W REFLEX MICROSCOPIC
Bilirubin Urine: NEGATIVE
Glucose, UA: NEGATIVE mg/dL
Ketones, ur: 15 mg/dL — AB
Leukocytes,Ua: NEGATIVE
Nitrite: NEGATIVE
Protein, ur: NEGATIVE mg/dL
Specific Gravity, Urine: 1.017 (ref 1.005–1.030)
pH: 6 (ref 5.0–8.0)

## 2021-09-06 LAB — COMPREHENSIVE METABOLIC PANEL
ALT: 18 U/L (ref 0–44)
AST: 27 U/L (ref 15–41)
Albumin: 4.2 g/dL (ref 3.5–5.0)
Alkaline Phosphatase: 49 U/L (ref 38–126)
Anion gap: 7 (ref 5–15)
BUN: 13 mg/dL (ref 8–23)
CO2: 28 mmol/L (ref 22–32)
Calcium: 8.9 mg/dL (ref 8.9–10.3)
Chloride: 101 mmol/L (ref 98–111)
Creatinine, Ser: 0.72 mg/dL (ref 0.44–1.00)
GFR, Estimated: >60 mL/min (ref >60)
Glucose, Bld: 101 mg/dL — ABNORMAL HIGH (ref 70–99)
Potassium: 3.6 mmol/L (ref 3.5–5.1)
Sodium: 136 mmol/L (ref 135–145)
Total Bilirubin: 0.3 mg/dL (ref 0.3–1.2)
Total Protein: 6.7 g/dL (ref 6.5–8.1)

## 2021-09-06 LAB — LIPASE, BLOOD: Lipase: 13 U/L (ref 11–51)

## 2021-09-06 MED ORDER — ONDANSETRON 8 MG PO TBDP: 8.0000 mg | ORAL_TABLET | Freq: Three times a day (TID) | ORAL | 0 refills | Status: DC | PRN

## 2021-09-06 NOTE — ED Provider Notes (Signed)
[facility] Provider Note   CSN: [id number] Arrival date & time: 09/06/21  2012     History Chief Complaint  Patient presents with   Abdominal Pain    Abigail Wilson is a 60 y.o. female.  The history is provided by the patient.  Abdominal Pain She has history of hepatitis C, multinodular goiter and comes in because of nausea, vomiting, abdominal bloating, diarrhea for the last 2 days.  She started feeling generally achy 3 days ago with aching being primarily in her joints.  She thinks she might of run a low-grade fever but denies chills or sweats.  2 days ago, she started having nausea with some occasional episodes of emesis.  She also started having diarrhea.  She is only having 2 or 3 watery bowel movements a day.  She is also complaining of generalized abdominal bloating.  Symptoms started after eating oysters, but several other people ate the oysters without getting sick.  She has none known sick contacts.   Past Medical History:  Diagnosis Date   Asthmatic bronchitis    Mild   Cataract    Depression    Hepatitis C    Hyperthyroidism    s/p 131Iodine Rx 2007   Hypothyroidism    LBP (low back pain)    Menopause    OA (osteoarthritis of spine)    OA (osteoarthritis)    Osteoporosis     Patient Active Problem List   Diagnosis Date Noted   Cellulitis of hand 08/01/2021   Brain fog 02/13/2021   Numbness 11/13/2020   Coronary atherosclerosis 11/11/2018   Chronic pain syndrome 05/07/2018   Edema 04/22/2017   History of hepatitis C 07/22/2016   Recurrent sinus infections 01/04/2016   Loss of weight 01/02/2015   Benign lipomatous neoplasm of skin and subcutaneous tissue of left leg 10/02/2014   Osteopenia 03/31/2014   Mass of foot or toe 11/28/2013   Well adult exam 12/09/2011   History of hepatitis B 12/09/2011   Low blood pressure, not hypotension 04/21/2011   BLEPHARITIS 10/07/2010   Acute upper respiratory infection 08/13/2010    GOITER, MULTINODULAR 03/19/2010   Hypothyroidism 03/19/2010   ACUTE LYMPHADENITIS 03/15/2010   TOBACCO USE, QUIT 07/25/2009   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   ROSACEA 03/14/2009   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   ABDOMINAL PAIN 10/31/2008   PNEUMONIA, ORGANISM UNSPECIFIED 05/29/2008   BRONCHITIS, ACUTE 01/11/2008   Depression 06/04/2007   OSTEOARTHRITIS 06/04/2007   LOW BACK PAIN 06/04/2007   OSTEOPOROSIS 03/27/2007    Past Surgical History:  Procedure Laterality Date   CESAREAN SECTION     COLONOSCOPY W/ POLYPECTOMY       OB History     Gravida  4   Para  2   Term      Preterm      AB  2   Living  2      SAB      IAB      Ectopic      Multiple      Live Births              Family History  Problem Relation Age of Onset   Heart disease Father    Hyperlipidemia Father    Hypertension Father    Diabetes Father    Cancer Father    Hypertension Other    Colon cancer Neg Hx    Pancreatic cancer Neg Hx    Stomach cancer Neg Hx  Esophageal cancer Neg Hx     Social History   Tobacco Use   Smoking status: Former    Packs/day: 0.25    Types: Cigarettes    Quit date: 03/31/2013    Years since quitting: 8.4   Smokeless tobacco: Never  Vaping Use   Vaping Use: Some days  Substance Use Topics   Alcohol use: Not Currently    Comment: OCC    Drug use: No    Home Medications Prior to Admission medications   Medication Sig Start Date End Date Taking? Authorizing Provider  albuterol (VENTOLIN HFA) 108 (90 Base) MCG/ACT inhaler Inhale 1-2 puffs into the lungs every 6 (six) hours as needed for wheezing or shortness of breath. 08/01/21   Plotnikov, Evie Lacks, MD  amoxicillin-clavulanate (AUGMENTIN) 875-125 MG tablet Take 1 tablet by mouth 2 (two) times daily. 08/01/21   Plotnikov, Evie Lacks, MD  aspirin 81 MG EC tablet Take 81 mg by mouth daily.    [provider]  Cholecalciferol 3000 units TABS Take 2 tablets by mouth daily.    [provider]  COENZYME Q-10 PO Take 200 mg by mouth daily.    [provider]  fluticasone (FLONASE) 50 MCG/ACT nasal spray Place 2 sprays into both nostrils daily. 02/15/20   Plotnikov, Evie Lacks, MD  furosemide (LASIX) 40 MG tablet TAKE ONE TABLET BY MOUTH DAILY 06/28/21   Plotnikov, Evie Lacks, MD  levothyroxine (SYNTHROID) 100 MCG tablet TAKE ONE TABLET BY MOUTH DAILY 08/05/21   Plotnikov, Evie Lacks, MD  oxyCODONE (OXYCONTIN) 40 mg 12 hr tablet Take 1 tablet (40 mg total) by mouth every 6 (six) hours. Please fill on or after 10/14/21 08/01/21   Plotnikov, Evie Lacks, MD  oxyCODONE (OXYCONTIN) 40 mg 12 hr tablet Take 1 tablet (40 mg total) by mouth every 6 (six) hours. 08/01/21   Plotnikov, Evie Lacks, MD  oxyCODONE (OXYCONTIN) 40 mg 12 hr tablet Take 1 tablet (40 mg total) by mouth every 6 (six) hours. 08/01/21   Plotnikov, Evie Lacks, MD  oxyCODONE (OXYCONTIN) 80 mg 12 hr tablet Take 1 tablet (80 mg total) by mouth every 6 (six) hours. 08/01/21   Plotnikov, Evie Lacks, MD  oxyCODONE (OXYCONTIN) 80 mg 12 hr tablet Take 1 tablet (80 mg total) by mouth every 6 (six) hours. 08/01/21   Plotnikov, Evie Lacks, MD  oxyCODONE (OXYCONTIN) 80 mg 12 hr tablet Take 1 tablet (80 mg total) by mouth every 6 (six) hours. 08/01/21   Plotnikov, Evie Lacks, MD  potassium chloride SA (KLOR-CON) 20 MEQ tablet TAKE ONE TABLET BY MOUTH EVERY DAY 04/09/21   Plotnikov, Evie Lacks, MD  Red Yeast Rice 600 MG CAPS 1 po qd 11/11/18   Plotnikov, Evie Lacks, MD    Allergies    Nsaids and Trazodone hcl  Review of Systems   Review of Systems  Gastrointestinal:  Positive for abdominal pain.  All other systems reviewed and are negative.  Physical Exam Updated Vital Signs BP 128/78    Pulse 71    Temp 98.3 F (36.8 C)    Resp 16    Ht 5\' 5"  (1.651 m)    Wt 59 kg    SpO2 98%    BMI 21.63 kg/m   Physical Exam Vitals and nursing note reviewed.  60 year old female, resting comfortably and in no acute distress. Vital signs are normal.  Oxygen saturation is 98%, which is normal. Head is normocephalic and atraumatic. PERRLA, EOMI. Oropharynx is clear.  Neck is nontender and supple without adenopathy or JVD. Back is nontender and there is no CVA tenderness. Lungs are clear without rales, wheezes, or rhonchi. Chest is nontender. Heart has regular rate and rhythm without murmur. Abdomen is soft, flat, nontender without masses or hepatosplenomegaly and peristalsis is slightly hypoactive. Extremities have no cyanosis or edema, full range of motion is present. Skin is warm and dry without rash. Neurologic: Mental status is normal, cranial nerves are intact, moves all extremities equally.  ED Results / Procedures / Treatments   Labs (all labs ordered are listed, but only abnormal results are displayed) Labs Reviewed  COMPREHENSIVE METABOLIC PANEL - Abnormal; Notable for the following components:      Result Value   Glucose, Bld 101 (*)    All other components within normal limits  URINALYSIS, ROUTINE W REFLEX MICROSCOPIC - Abnormal; Notable for the following components:   Hgb urine dipstick TRACE (*)    Ketones, ur 15 (*)    All other components within normal limits  LIPASE, BLOOD  CBC   Procedures Procedures   Medications Ordered in ED Medications - No data to display  ED Course  I have reviewed the triage vital signs and the nursing notes.  Pertinent lab results that were available during my care of the patient were reviewed by me and considered in my medical decision making (see chart for details).   MDM Rules/Calculators/A&P                         Nausea, vomiting, diarrhea and pattern most consistent with viral gastroenteritis.  Doubt food poisoning given other people exposed to the same food without developing any symptoms.  No red flags to suggest serious abdominal pathology such as cholecystitis, pancreatitis, diverticulitis.  No indication for abdominal imaging.  Old records are reviewed, and she has no  relevant past visits.  She was offered IV fluids, but declined.  She also declined antispasmodic medication.  She states that she does have Imodium at home and is she is advised to take that as needed for diarrhea.  She is given a prescription for ondansetron oral dissolving tablets.  Return precautions discussed.  Final Clinical Impression(s) / ED Diagnoses Final diagnoses:  Nausea vomiting and diarrhea    Rx / DC Orders ED Discharge Orders          Ordered    ondansetron (ZOFRAN-ODT) 8 MG disintegrating tablet  Every 8 hours PRN        09/06/21 6222             Delora Fuel, MD 97/98/92 2324

## 2021-09-06 NOTE — Discharge Instructions (Signed)
Drink plenty of fluids.  Take loperamide (Imodium A-D) as needed for diarrhea.  Return if pain is getting worse, you start running a high fever, or vomiting is getting worse to where the medication is not controlling it.

## 2021-09-06 NOTE — ED Notes (Signed)
Patient left prior to receiving discharge paperwork or vitals. RX sent to pharmacy electronically. Provider reviewed return precautions.

## 2021-09-06 NOTE — ED Triage Notes (Signed)
Pt arrives pov with c/o lower abdominal pain with n/v/d x 4 days after eating oysters, was concerned of having food poisoning

## 2021-09-11 ENCOUNTER — Ambulatory Visit: Payer: Medicare HMO | Admitting: Internal Medicine

## 2021-09-12 ENCOUNTER — Telehealth: Payer: Self-pay

## 2021-09-12 NOTE — Telephone Encounter (Signed)
Pt calling in to check status of PA for Oxy 80mg  and 40mg . Pt states the Pharmacy told her it needed to be completed by 12/31.  Pt would like update on the status.

## 2021-09-18 NOTE — Telephone Encounter (Signed)
Rec'd PA determination letter. Med Oxycodone HCL 12 hour tab was approval for non-formulary. Coverage dates 09/15/21-09/14/22. Faxed approval letter to [facility].Marland KitchenJohny Chess

## 2021-10-14 ENCOUNTER — Other Ambulatory Visit: Payer: Self-pay | Admitting: Internal Medicine

## 2021-10-14 NOTE — Telephone Encounter (Signed)
Pt states insurance will not cover Oxy 80mg  and 40mg  generic  Pt requesting  rx for name brand Oxy 80mg  and 40mg   Pt requesting a c/b

## 2021-10-14 NOTE — Telephone Encounter (Signed)
I am not sure I understand.  Is the message saying that generic oxycodone is not covered and the brand is covered?  I never heard this before.  Thanks

## 2021-10-16 NOTE — Telephone Encounter (Signed)
Rec'd approval back for oxycodone 80 mg it states " This approval authorizes your coverage from 09/15/2021 - 09/14/2022, unless we notify you otherwise, and as long as the following conditions apply: ? 1. you remain enrolled in our Medicare Part D prescription drug plan, ? 2. your physician or other prescriber continues to prescribe the medication for you, and the medication continues to be safe for treating your condition.  Faxed approval to [facility]...lmb

## 2021-10-16 NOTE — Telephone Encounter (Signed)
PA submitted via cover-my-meds w/ key# [id number]. Rec'd msg Your PA request has been sent to [facility] Part D. Will recheck later for determination.Marland KitchenJohny Wilson

## 2021-10-18 NOTE — Telephone Encounter (Signed)
Start PA for oxycodone 40mg  w/ (Key: [id number]). Rec'd msg stating " The patient currently has access to the requested medication and a Prior Authorization is not needed for the patient/medication.".Marland KitchenJohny Wilson

## 2021-11-06 ENCOUNTER — Ambulatory Visit (INDEPENDENT_AMBULATORY_CARE_PROVIDER_SITE_OTHER): Payer: Medicare HMO | Admitting: Internal Medicine

## 2021-11-06 ENCOUNTER — Encounter: Payer: Self-pay | Admitting: Internal Medicine

## 2021-11-06 ENCOUNTER — Other Ambulatory Visit: Payer: Self-pay

## 2021-11-06 DIAGNOSIS — E034 Atrophy of thyroid (acquired): Secondary | ICD-10-CM | POA: Diagnosis not present

## 2021-11-06 DIAGNOSIS — G8929 Other chronic pain: Secondary | ICD-10-CM | POA: Diagnosis not present

## 2021-11-06 DIAGNOSIS — M5440 Lumbago with sciatica, unspecified side: Secondary | ICD-10-CM | POA: Diagnosis not present

## 2021-11-06 MED ORDER — OXYCODONE HCL ER 80 MG PO T12A: 80.0000 mg | EXTENDED_RELEASE_TABLET | Freq: Four times a day (QID) | ORAL | 0 refills | Status: DC

## 2021-11-06 MED ORDER — OXYCODONE HCL ER 40 MG PO T12A: 40.0000 mg | EXTENDED_RELEASE_TABLET | Freq: Four times a day (QID) | ORAL | 0 refills | Status: DC

## 2021-11-06 NOTE — Patient Instructions (Signed)
For a mild COVID-19 case - take zinc 50 mg a day for 1 week, vitamin C 1000 mg daily for 1 week, vitamin D2 50,000 units weekly for 2 months (unless  taking vitamin D daily already), an antioxidant Quercetin 500 mg twice a day for 1 week (if you can get it quick enough). Take Allegra or Benadryl.  Maintain good oral hydration and take Tylenol for high fever.  Call if problems. Isolate for 5 days, then wear a mask for 5 days per CDC.  

## 2021-11-06 NOTE — Assessment & Plan Note (Signed)
Chronic Continue with oxycodone/Oxycontin Rx - long term  Potential benefits of a long term opioids use as well as potential risks (i.e. addiction risk, apnea etc) and complications (i.e. Somnolence, constipation and others) were explained to the patient and were aknowledged. 

## 2021-11-06 NOTE — Progress Notes (Signed)
Subjective:  Patient ID: Abigail Wilson, female    DOB: 28-May-1961  Age: 61 y.o. MRN: [id number]  CC: Follow-up   HPI Abigail Wilson presents for LBP, hypothyroidism  Outpatient Medications Prior to Visit  Medication Sig Dispense Refill   albuterol (VENTOLIN HFA) 108 (90 Base) MCG/ACT inhaler Inhale 1-2 puffs into the lungs every 6 (six) hours as needed for wheezing or shortness of breath. 8.5 each 1   aspirin 81 MG EC tablet Take 81 mg by mouth daily.     Cholecalciferol 3000 units TABS Take 2 tablets by mouth daily.     COENZYME Q-10 PO Take 200 mg by mouth daily.     fluticasone (FLONASE) 50 MCG/ACT nasal spray Place 2 sprays into both nostrils daily. 16 g 11   furosemide (LASIX) 40 MG tablet TAKE ONE TABLET BY MOUTH DAILY 90 tablet 1   levothyroxine (SYNTHROID) 100 MCG tablet TAKE ONE TABLET BY MOUTH DAILY 90 tablet 3   ondansetron (ZOFRAN-ODT) 8 MG disintegrating tablet Take 1 tablet (8 mg total) by mouth every 8 (eight) hours as needed for nausea or vomiting. 20 tablet 0   potassium chloride SA (KLOR-CON M) 20 MEQ tablet TAKE ONE TABLET BY MOUTH EVERY DAY 30 tablet 2   Red Yeast Rice 600 MG CAPS 1 po qd 100 capsule 3   oxyCODONE (OXYCONTIN) 40 mg 12 hr tablet Take 1 tablet (40 mg total) by mouth every 6 (six) hours. Please fill on or after 10/14/21 120 tablet 0   oxyCODONE (OXYCONTIN) 40 mg 12 hr tablet Take 1 tablet (40 mg total) by mouth every 6 (six) hours. 120 tablet 0   oxyCODONE (OXYCONTIN) 40 mg 12 hr tablet Take 1 tablet (40 mg total) by mouth every 6 (six) hours. 120 tablet 0   oxyCODONE (OXYCONTIN) 80 mg 12 hr tablet Take 1 tablet (80 mg total) by mouth every 6 (six) hours. 120 tablet 0   oxyCODONE (OXYCONTIN) 80 mg 12 hr tablet Take 1 tablet (80 mg total) by mouth every 6 (six) hours. 120 tablet 0   oxyCODONE (OXYCONTIN) 80 mg 12 hr tablet Take 1 tablet (80 mg total) by mouth every 6 (six) hours. 120 tablet 0   No facility-administered medications prior to visit.     ROS: Review of Systems  Constitutional:  Negative for activity change, appetite change, chills, fatigue and unexpected weight change.  HENT:  Negative for congestion, mouth sores and sinus pressure.   Eyes:  Negative for visual disturbance.  Respiratory:  Negative for cough and chest tightness.   Gastrointestinal:  Negative for abdominal pain and nausea.  Genitourinary:  Negative for difficulty urinating, frequency and vaginal pain.  Musculoskeletal:  Positive for back pain. Negative for gait problem.  Skin:  Negative for pallor and rash.  Neurological:  Negative for dizziness, tremors, weakness, numbness and headaches.  Psychiatric/Behavioral:  Negative for confusion and sleep disturbance.    Objective:  BP 116/70    Pulse 77    Temp 98.3 F (36.8 C) (Oral)    Ht 5\' 5"  (1.651 m)    Wt 126 lb 8 oz (57.4 kg)    SpO2 98%    BMI 21.05 kg/m   BP Readings from Last 3 Encounters:  11/06/21 116/70  09/06/21 128/78  08/01/21 114/72    Wt Readings from Last 3 Encounters:  11/06/21 126 lb 8 oz (57.4 kg)  09/06/21 130 lb (59 kg)  08/01/21 128 lb 12.8 oz (58.4 kg)  Physical Exam Constitutional:      General: She is not in acute distress.    Appearance: She is well-developed.  HENT:     Head: Normocephalic.     Right Ear: External ear normal.     Left Ear: External ear normal.     Nose: Nose normal.  Eyes:     General:        Right eye: No discharge.        Left eye: No discharge.     Conjunctiva/sclera: Conjunctivae normal.     Pupils: Pupils are equal, round, and reactive to light.  Neck:     Thyroid: No thyromegaly.     Vascular: No JVD.     Trachea: No tracheal deviation.  Cardiovascular:     Rate and Rhythm: Normal rate and regular rhythm.     Heart sounds: Normal heart sounds.  Pulmonary:     Effort: No respiratory distress.     Breath sounds: No stridor. No wheezing.  Abdominal:     General: Bowel sounds are normal. There is no distension.     Palpations:  Abdomen is soft. There is no mass.     Tenderness: There is no abdominal tenderness. There is no guarding or rebound.  Musculoskeletal:        General: Tenderness present.     Cervical back: Normal range of motion and neck supple. No rigidity.  Lymphadenopathy:     Cervical: No cervical adenopathy.  Skin:    Findings: No erythema or rash.  Neurological:     Cranial Nerves: No cranial nerve deficit.     Motor: No abnormal muscle tone.     Coordination: Coordination normal.     Deep Tendon Reflexes: Reflexes normal.  Psychiatric:        Behavior: Behavior normal.        Thought Content: Thought content normal.        Judgment: Judgment normal.  Lumbar spine-pain with range of motion  Lab Results  Component Value Date   WBC 8.7 09/06/2021   HGB 13.8 09/06/2021   HCT 39.2 09/06/2021   PLT 229 09/06/2021   GLUCOSE 101 (H) 09/06/2021   CHOL 204 (H) 11/18/2019   TRIG 55.0 11/18/2019   HDL 101.60 11/18/2019   LDLCALC 91 11/18/2019   ALT 18 09/06/2021   AST 27 09/06/2021   NA 136 09/06/2021   K 3.6 09/06/2021   CL 101 09/06/2021   CREATININE 0.72 09/06/2021   BUN 13 09/06/2021   CO2 28 09/06/2021   TSH 4.87 (H) 11/13/2020    No results found.  Assessment & Plan:   Problem List Items Addressed This Visit     Hypothyroidism   Relevant Medications   oxyCODONE (OXYCONTIN) 40 mg 12 hr tablet   oxyCODONE (OXYCONTIN) 40 mg 12 hr tablet   oxyCODONE (OXYCONTIN) 80 mg 12 hr tablet   oxyCODONE (OXYCONTIN) 80 mg 12 hr tablet   oxyCODONE (OXYCONTIN) 80 mg 12 hr tablet   LOW BACK PAIN    Chronic Continue with oxycodone/Oxycontin Rx - long term  Potential benefits of a long term opioids use as well as potential risks (i.e. addiction risk, apnea etc) and complications (i.e. Somnolence, constipation and others) were explained to the patient and were aknowledged.       Relevant Medications   oxyCODONE (OXYCONTIN) 40 mg 12 hr tablet   oxyCODONE (OXYCONTIN) 40 mg 12 hr tablet    oxyCODONE (OXYCONTIN) 40 mg 12 hr tablet  oxyCODONE (OXYCONTIN) 80 mg 12 hr tablet   oxyCODONE (OXYCONTIN) 80 mg 12 hr tablet   oxyCODONE (OXYCONTIN) 80 mg 12 hr tablet      Meds ordered this encounter  Medications   oxyCODONE (OXYCONTIN) 40 mg 12 hr tablet    Sig: Take 1 tablet (40 mg total) by mouth every 6 (six) hours. Please fill on or after 11/14/21    Dispense:  120 tablet    Refill:  0   oxyCODONE (OXYCONTIN) 40 mg 12 hr tablet    Sig: Take 1 tablet (40 mg total) by mouth every 6 (six) hours.    Dispense:  120 tablet    Refill:  0    Please fill on or after 01/13/22 Dx: M54.5 and G89.4   oxyCODONE (OXYCONTIN) 40 mg 12 hr tablet    Sig: Take 1 tablet (40 mg total) by mouth every 6 (six) hours.    Dispense:  120 tablet    Refill:  0    Please fill on or after 12/14/21. Dx: M54.5   oxyCODONE (OXYCONTIN) 80 mg 12 hr tablet    Sig: Take 1 tablet (80 mg total) by mouth every 6 (six) hours.    Dispense:  120 tablet    Refill:  0    Please fill on or after 11/14/21   oxyCODONE (OXYCONTIN) 80 mg 12 hr tablet    Sig: Take 1 tablet (80 mg total) by mouth every 6 (six) hours.    Dispense:  120 tablet    Refill:  0    Please fill on or after 01/13/2022. DX code:M54.5   oxyCODONE (OXYCONTIN) 80 mg 12 hr tablet    Sig: Take 1 tablet (80 mg total) by mouth every 6 (six) hours.    Dispense:  120 tablet    Refill:  0    Please fill on or after 12/14/21      Follow-up: Return in about 3 months (around 02/03/2022) for a follow-up visit.  Walker Kehr, MD

## 2021-11-19 ENCOUNTER — Ambulatory Visit (INDEPENDENT_AMBULATORY_CARE_PROVIDER_SITE_OTHER): Payer: Medicare HMO

## 2021-11-19 DIAGNOSIS — Z1211 Encounter for screening for malignant neoplasm of colon: Secondary | ICD-10-CM

## 2021-11-19 DIAGNOSIS — Z1231 Encounter for screening mammogram for malignant neoplasm of breast: Secondary | ICD-10-CM | POA: Diagnosis not present

## 2021-11-19 DIAGNOSIS — Z0000 Encounter for general adult medical examination without abnormal findings: Secondary | ICD-10-CM | POA: Diagnosis not present

## 2021-11-19 NOTE — Progress Notes (Cosign Needed Addendum)
I connected with Samule Dry today by telephone and verified that I am speaking with the correct person using two identifiers. Location patient: home Location provider: work Persons participating in the virtual visit: patient, provider.   I discussed the limitations, risks, security and privacy concerns of performing an evaluation and management service by telephone and the availability of in person appointments. I also discussed with the patient that there may be a patient responsible charge related to this service. The patient expressed understanding and verbally consented to this telephonic visit.    Interactive audio and video telecommunications were attempted between this provider and patient, however failed, due to patient having technical difficulties OR patient did not have access to video capability.  We continued and completed visit with audio only.  Some vital signs may be absent or patient reported.   Time Spent with patient on telephone encounter: 40 minutes  Subjective:   Abigail Wilson is a 61 y.o. female who presents for Medicare Annual (Subsequent) preventive examination.  Review of Systems     Cardiac Risk Factors include: family history of premature cardiovascular disease     Objective:    There were no vitals filed for this visit. There is no height or weight on file to calculate BMI.  Advanced Directives 11/19/2021 09/06/2021 11/12/2020 03/27/2017 03/13/2017  Does Patient Have a Medical Advance Directive? No No No No No  Would patient like information on creating a medical advance directive? No - Patient declined - No - Patient declined - -    Current Medications (verified) Outpatient Encounter Medications as of 11/19/2021  Medication Sig   albuterol (VENTOLIN HFA) 108 (90 Base) MCG/ACT inhaler Inhale 1-2 puffs into the lungs every 6 (six) hours as needed for wheezing or shortness of breath.   aspirin 81 MG EC tablet Take 81 mg by mouth daily.    Cholecalciferol 3000 units TABS Take 2 tablets by mouth daily.   COENZYME Q-10 PO Take 200 mg by mouth daily.   fluticasone (FLONASE) 50 MCG/ACT nasal spray Place 2 sprays into both nostrils daily.   furosemide (LASIX) 40 MG tablet TAKE ONE TABLET BY MOUTH DAILY   levothyroxine (SYNTHROID) 100 MCG tablet TAKE ONE TABLET BY MOUTH DAILY   ondansetron (ZOFRAN-ODT) 8 MG disintegrating tablet Take 1 tablet (8 mg total) by mouth every 8 (eight) hours as needed for nausea or vomiting.   oxyCODONE (OXYCONTIN) 40 mg 12 hr tablet Take 1 tablet (40 mg total) by mouth every 6 (six) hours. Please fill on or after 11/14/21   oxyCODONE (OXYCONTIN) 40 mg 12 hr tablet Take 1 tablet (40 mg total) by mouth every 6 (six) hours.   oxyCODONE (OXYCONTIN) 40 mg 12 hr tablet Take 1 tablet (40 mg total) by mouth every 6 (six) hours.   oxyCODONE (OXYCONTIN) 80 mg 12 hr tablet Take 1 tablet (80 mg total) by mouth every 6 (six) hours.   oxyCODONE (OXYCONTIN) 80 mg 12 hr tablet Take 1 tablet (80 mg total) by mouth every 6 (six) hours.   oxyCODONE (OXYCONTIN) 80 mg 12 hr tablet Take 1 tablet (80 mg total) by mouth every 6 (six) hours.   potassium chloride SA (KLOR-CON M) 20 MEQ tablet TAKE ONE TABLET BY MOUTH EVERY DAY   Red Yeast Rice 600 MG CAPS 1 po qd   No facility-administered encounter medications on file as of 11/19/2021.    Allergies (verified) Nsaids and Trazodone hcl   History: Past Medical History:  Diagnosis Date   Asthmatic  bronchitis    Mild   Cataract    Depression    Hepatitis C    Hyperthyroidism    s/p 131Iodine Rx 2007   Hypothyroidism    LBP (low back pain)    Menopause    OA (osteoarthritis of spine)    OA (osteoarthritis)    Osteoporosis    Past Surgical History:  Procedure Laterality Date   CESAREAN SECTION     COLONOSCOPY W/ POLYPECTOMY     Family History  Problem Relation Age of Onset   Heart disease Father    Hyperlipidemia Father    Hypertension Father    Diabetes Father     Cancer Father    Hypertension Other    Colon cancer Neg Hx    Pancreatic cancer Neg Hx    Stomach cancer Neg Hx    Esophageal cancer Neg Hx    Social History   Socioeconomic History   Marital status: Married    Spouse name: Not on file   Number of children: Not on file   Years of education: Not on file   Highest education level: Not on file  Occupational History   Occupation: retired    Fish farm manager: UNEMPLOYED  Tobacco Use   Smoking status: Former    Packs/day: 0.25    Types: Cigarettes    Quit date: 03/31/2013    Years since quitting: 8.6   Smokeless tobacco: Never  Vaping Use   Vaping Use: Some days  Substance and Sexual Activity   Alcohol use: Not Currently    Comment: OCC    Drug use: No   Sexual activity: Not Currently    Comment: 1st intercourse- 15, partners- 79, married- 81 yrs   Other Topics Concern   Not on file  Social History Narrative   Not on file   Social Determinants of Health   Financial Resource Strain: Low Risk    Difficulty of Paying Living Expenses: Not hard at all  Food Insecurity: No Food Insecurity   Worried About Charity fundraiser in the Last Year: Never true   Arboriculturist in the Last Year: Never true  Transportation Needs: No Transportation Needs   Lack of Transportation (Medical): No   Lack of Transportation (Non-Medical): No  Physical Activity: Sufficiently Active   Days of Exercise per Week: 5 days   Minutes of Exercise per Session: 30 min  Stress: No Stress Concern Present   Feeling of Stress : Not at all  Social Connections: Socially Integrated   Frequency of Communication with Friends and Family: More than three times a week   Frequency of Social Gatherings with Friends and Family: Once a week   Attends Religious Services: More than 4 times per year   Active Member of [employer] or Organizations: Yes   Attends Music therapist: More than 4 times per year   Marital Status: Married    Tobacco Counseling Counseling  given: Not Answered   Clinical Intake:  Pre-visit preparation completed: Yes  Pain : No/denies pain     Nutritional Risks: None Diabetes: No  How often do you need to have someone help you when you read instructions, pamphlets, or other written materials from your doctor or pharmacy?: 1 - Never 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 no  Interpreter Needed?: No  Information entered by :: Lisette Abu, LPN   Activities of Daily Living In your present state of health, do you  have any difficulty performing the following activities: 11/19/2021  Hearing? N  Vision? N  Difficulty concentrating or making decisions? N  Walking or climbing stairs? N  Dressing or bathing? N  Doing errands, shopping? N  Preparing Food and eating ? N  Using the Toilet? N  In the past six months, have you accidently leaked urine? N  Do you have problems with loss of bowel control? N  Managing your Medications? N  Managing your Finances? N  Housekeeping or managing your Housekeeping? N  Some recent data might be hidden    Patient Care Team: Plotnikov, Evie Lacks, MD as PCP - General Renato Shin, MD as Attending Physician (Endocrinology) Terrance Mass, MD (Inactive) (Obstetrics and Gynecology) Ladene Artist, MD (Gastroenterology) Clent Jacks, MD as Consulting Physician (Ophthalmology)  Indicate any recent Medical Services you may have received from other than [facility] providers in the past year (date may be approximate).     Assessment:   This is a routine wellness examination for [place].  Hearing/Vision screen Hearing Screening - Comments:: Patient denied any hearing difficulty.   No hearing aids.  Vision Screening - Comments:: Patient does wear readers. Eye exam done by: Wallis Mart, MD.  Dietary issues and exercise activities discussed: Current Exercise Habits: Home exercise routine;Structured exercise class, Type of exercise: walking,  Time (Minutes): 30 (2 hours, twice a week at gym), Frequency (Times/Week): 5, Weekly Exercise (Minutes/Week): 150, Intensity: Moderate   Goals Addressed   None   Depression Screen PHQ 2/9 Scores 11/06/2021 11/12/2020 08/14/2020 04/23/2017 01/20/2017 07/16/2015  PHQ - 2 Score 0 0 0 1 0 0  PHQ- 9 Score - - - 2 2 -    Fall Risk Fall Risk  11/19/2021 11/06/2021 11/12/2020  Falls in the past year? 0 0 0  Number falls in past yr: 0 - 0  Injury with Fall? 0 - 0  Risk for fall due to : No Fall Risks - No Fall Risks  Follow up Falls evaluation completed - -    FALL RISK PREVENTION PERTAINING TO THE HOME:  Any stairs in or around the home? No  If so, are there any without handrails? No  Home free of loose throw rugs in walkways, pet beds, electrical cords, etc? Yes  Adequate lighting in your home to reduce risk of falls? Yes   ASSISTIVE DEVICES UTILIZED TO PREVENT FALLS:  Life alert? No  Use of a cane, walker or w/c? No  Grab bars in the bathroom? No  Shower chair or bench in shower? No  Elevated toilet seat or a handicapped toilet? No   TIMED UP AND GO:  Was the test performed? No .  Length of time to ambulate 10 feet: n/a sec.   Gait steady and fast without use of assistive device  Cognitive Function: Normal cognitive status assessed by direct observation by this Nurse Health Advisor. No abnormalities found.          Immunizations Immunization History  Administered Date(s) Administered   H1N1 08/22/2008   Influenza Split 07/22/2011, 07/02/2012   Influenza Whole 06/13/2009, 06/28/2010   Influenza, High Dose Seasonal PF 06/16/2016, 06/23/2017, 07/05/2018   Influenza,inj,Quad PF,6+ Mos 07/19/2013, 07/17/2014, 07/16/2015, 05/16/2019, 07/14/2020   Influenza,inj,Quad PF,6-35 Mos 07/09/2021   PNEUMOCOCCAL CONJUGATE-20 05/14/2021   Pneumococcal Conjugate-13 10/02/2014   Pneumococcal Polysaccharide-23 12/09/2011   Td 04/28/2002   Tdap 01/02/2015    TDAP status: Up to date  Flu  Vaccine status: Up to date  Pneumococcal vaccine status: Up  to date  Covid-19 vaccine status: Declined, Education has been provided regarding the importance of this vaccine but patient still declined. Advised may receive this vaccine at local pharmacy or Health Dept.or vaccine clinic. Aware to provide a copy of the vaccination record if obtained from local pharmacy or Health Dept. Verbalized acceptance and understanding.  Qualifies for Shingles Vaccine? Yes   Zostavax completed No   Shingrix Completed?: No.    Education has been provided regarding the importance of this vaccine. Patient has been advised to call insurance company to determine out of pocket expense if they have not yet received this vaccine. Advised may also receive vaccine at local pharmacy or Health Dept. Verbalized acceptance and understanding.  Screening Tests Health Maintenance  Topic Date Due   HIV Screening  Never done   Zoster Vaccines- Shingrix (1 of 2) Never done   MAMMOGRAM  04/18/2016   PAP SMEAR-Modifier  01/25/2021   COLONOSCOPY (Pts 45-50yr Insurance coverage will need to be confirmed)  03/27/2022   TETANUS/TDAP  01/01/2025   INFLUENZA VACCINE  Completed   Hepatitis C Screening  Completed   HPV VACCINES  Aged Out   COVID-19 Vaccine  Discontinued    Health Maintenance  Health Maintenance Due  Topic Date Due   HIV Screening  Never done   Zoster Vaccines- Shingrix (1 of 2) Never done   MAMMOGRAM  04/18/2016   PAP SMEAR-Modifier  01/25/2021    Colorectal cancer screening: Referral to GI placed 11/19/2021. Pt aware the office will call re: appt.  Mammogram status: Ordered 11/19/2021. Pt provided with contact info and advised to call to schedule appt.   Bone Density status: Completed 01/17/2020. Results reflect: Bone density results: OSTEOPENIA. Repeat every 2-3 years.  Lung Cancer Screening: (Low Dose CT Chest recommended if Age 62-80years, 30 pack-year currently smoking OR have quit w/in 15years.) does  not qualify.   Lung Cancer Screening Referral: no  Additional Screening:  Hepatitis C Screening: does qualify; Completed yes  Vision Screening: Recommended annual ophthalmology exams for early detection of glaucoma and other disorders of the eye. Is the patient up to date with their annual eye exam?  Yes  Who is the provider or what is the name of the office in which the patient attends annual eye exams? Dr. RClent JacksIf pt is not established with a provider, would they like to be referred to a provider to establish care? No .   Dental Screening: Recommended annual dental exams for proper oral hygiene  Community Resource Referral / Chronic Care Management: CRR required this visit?  No   CCM required this visit?  No      Plan:     I have personally reviewed and noted the following in the patients chart:   Medical and social history Use of alcohol, tobacco or illicit drugs  Current medications and supplements including opioid prescriptions.  Functional ability and status Nutritional status Physical activity Advanced directives List of other physicians Hospitalizations, surgeries, and ER visits in previous 12 months Vitals Screenings to include cognitive, depression, and falls Referrals and appointments  In addition, I have reviewed and discussed with patient certain preventive protocols, quality metrics, and best practice recommendations. A written personalized care plan for preventive services as well as general preventive health recommendations were provided to patient.     SSheral Flow LPN   37/12/2593  Nurse Notes:  Patient is cognitive intact. Medications reviewed: patient is taking opioids. No height, weight or vital signs  recorded for this visit. Referral to: Gastroenterology for screening colonoscopy. Referral to: [facility] for screening mammogram.   Medical screening examination/treatment/procedure(s) were performed by non-physician  practitioner and as supervising physician I was immediately available for consultation/collaboration.  I agree with above. Lew Dawes, MD

## 2021-11-19 NOTE — Patient Instructions (Signed)
Abigail Wilson , Thank you for taking time to come for your Medicare Wellness Visit. I appreciate your ongoing commitment to your health goals. Please review the following plan we discussed and let me know if I can assist you in the future.   Screening recommendations/referrals: Colonoscopy: 03/27/2017; due every 5 years (Ordered 11/19/2021) Mammogram: 04/18/2014; due every 1-2 years (Ordered 11/19/2021) Bone Density: 01/17/2020; due every 2 years Recommended yearly ophthalmology/optometry visit for glaucoma screening and checkup Recommended yearly dental visit for hygiene and checkup  Vaccinations: Influenza vaccine: 07/09/2021 Pneumococcal vaccine: 12/09/2011, 05/14/2021 Tdap vaccine: 01/02/2015; due every 10 years Shingles vaccine: never done; patient plans to complete 2023  Covid-19:declined  Advanced directives: Advance directive discussed with you today. Even though you declined this today please call our office should you change your mind and we can give you the proper paperwork for you to fill out.  Conditions/risks identified: Patient declined any health goals at this time.  Next appointment: Please contact [phone] to schedule your next Annual Wellness Visit in 1 year with [place].  Preventive Care 40-64 Years, Female Preventive care refers to lifestyle choices and visits with your health care provider that can promote health and wellness.  What does preventive care include? A yearly physical exam. This is also called an annual well check. Dental exams once or twice a year. Routine eye exams. Ask your health care provider how often you should have your eyes checked. Personal lifestyle choices, including: Daily care of your teeth and gums. Regular physical activity. Eating a healthy diet. Avoiding tobacco and drug use. Limiting alcohol use. Practicing safe sex. Taking low-dose aspirin daily starting at age 70. Taking vitamin and mineral supplements as recommended by  your health care provider. What happens during an annual well check? The services and screenings done by your health care provider during your annual well check will depend on your age, overall health, lifestyle risk factors, and family history of disease. Counseling  Your health care provider may ask you questions about your: Alcohol use. Tobacco use. Drug use. Emotional well-being. Home and relationship well-being. Sexual activity. Eating habits. Work and work Statistician. Method of birth control. Menstrual cycle. Pregnancy history. Screening  You may have the following tests or measurements: Height, weight, and BMI. Blood pressure. Lipid and cholesterol levels. These may be checked every 5 years, or more frequently if you are over 43 years old. Skin check. Lung cancer screening. You may have this screening every year starting at age 90 if you have a 30-pack-year history of smoking and currently smoke or have quit within the past 15 years. Fecal occult blood test (FOBT) of the stool. You may have this test every year starting at age 54. Flexible sigmoidoscopy or colonoscopy. You may have a sigmoidoscopy every 5 years or a colonoscopy every 10 years starting at age 52. Hepatitis C blood test. Hepatitis B blood test. Sexually transmitted disease (STD) testing. Diabetes screening. This is done by checking your blood sugar (glucose) after you have not eaten for a while (fasting). You may have this done every 1-3 years. Mammogram. This may be done every 1-2 years. Talk to your health care provider about when you should start having regular mammograms. This may depend on whether you have a family history of breast cancer. BRCA-related cancer screening. This may be done if you have a family history of breast, ovarian, tubal, or peritoneal cancers. Pelvic exam and Pap test. This may be done every 3 years starting at age 47.  Starting at age 60, this may be done every 5 years if you have a Pap  test in combination with an HPV test. Bone density scan. This is done to screen for osteoporosis. You may have this scan if you are at high risk for osteoporosis. Discuss your test results, treatment options, and if necessary, the need for more tests with your health care provider. Vaccines  Your health care provider may recommend certain vaccines, such as: Influenza vaccine. This is recommended every year. Tetanus, diphtheria, and acellular pertussis (Tdap, Td) vaccine. You may need a Td booster every 10 years. Zoster vaccine. You may need this after age 67. Pneumococcal 13-valent conjugate (PCV13) vaccine. You may need this if you have certain conditions and were not previously vaccinated. Pneumococcal polysaccharide (PPSV23) vaccine. You may need one or two doses if you smoke cigarettes or if you have certain conditions. Talk to your health care provider about which screenings and vaccines you need and how often you need them. This information is not intended to replace advice given to you by your health care provider. Make sure you discuss any questions you have with your health care provider. Document Released: 09/28/2015 Document Revised: 05/21/2016 Document Reviewed: 07/03/2015 Elsevier Interactive Patient Education  2017 [place] Prevention in the Home Falls can cause injuries. They can happen to people of all ages. There are many things you can do to make your home safe and to help prevent falls. What can I do on the outside of my home? Regularly fix the edges of walkways and driveways and fix any cracks. Remove anything that might make you trip as you walk through a door, such as a raised step or threshold. Trim any bushes or trees on the path to your home. Use bright outdoor lighting. Clear any walking paths of anything that might make someone trip, such as rocks or tools. Regularly check to see if handrails are loose or broken. Make sure that both sides of any steps  have handrails. Any raised decks and porches should have guardrails on the edges. Have any leaves, snow, or ice cleared regularly. Use sand or salt on walking paths during winter. Clean up any spills in your garage right away. This includes oil or grease spills. What can I do in the bathroom? Use night lights. Install grab bars by the toilet and in the tub and shower. Do not use towel bars as grab bars. Use non-skid mats or decals in the tub or shower. If you need to sit down in the shower, use a plastic, non-slip stool. Keep the floor dry. Clean up any water that spills on the floor as soon as it happens. Remove soap buildup in the tub or shower regularly. Attach bath mats securely with double-sided non-slip rug tape. Do not have throw rugs and other things on the floor that can make you trip. What can I do in the bedroom? Use night lights. Make sure that you have a light by your bed that is easy to reach. Do not use any sheets or blankets that are too big for your bed. They should not hang down onto the floor. Have a firm chair that has side arms. You can use this for support while you get dressed. Do not have throw rugs and other things on the floor that can make you trip. What can I do in the kitchen? Clean up any spills right away. Avoid walking on wet floors. Keep items that you use  a lot in easy-to-reach places. If you need to reach something above you, use a strong step stool that has a grab bar. Keep electrical cords out of the way. Do not use floor polish or wax that makes floors slippery. If you must use wax, use non-skid floor wax. Do not have throw rugs and other things on the floor that can make you trip. What can I do with my stairs? Do not leave any items on the stairs. Make sure that there are handrails on both sides of the stairs and use them. Fix handrails that are broken or loose. Make sure that handrails are as long as the stairways. Check any carpeting to make  sure that it is firmly attached to the stairs. Fix any carpet that is loose or worn. Avoid having throw rugs at the top or bottom of the stairs. If you do have throw rugs, attach them to the floor with carpet tape. Make sure that you have a light switch at the top of the stairs and the bottom of the stairs. If you do not have them, ask someone to add them for you. What else can I do to help prevent falls? Wear shoes that: Do not have high heels. Have rubber bottoms. Are comfortable and fit you well. Are closed at the toe. Do not wear sandals. If you use a stepladder: Make sure that it is fully opened. Do not climb a closed stepladder. Make sure that both sides of the stepladder are locked into place. Ask someone to hold it for you, if possible. Clearly mark and make sure that you can see: Any grab bars or handrails. First and last steps. Where the edge of each step is. Use tools that help you move around (mobility aids) if they are needed. These include: Canes. Walkers. Scooters. Crutches. Turn on the lights when you go into a dark area. Replace any light bulbs as soon as they burn out. Set up your furniture so you have a clear path. Avoid moving your furniture around. If any of your floors are uneven, fix them. If there are any pets around you, be aware of where they are. Review your medicines with your doctor. Some medicines can make you feel dizzy. This can increase your chance of falling. Ask your doctor what other things that you can do to help prevent falls. This information is not intended to replace advice given to you by your health care provider. Make sure you discuss any questions you have with your health care provider. Document Released: 06/28/2009 Document Revised: 02/07/2016 Document Reviewed: 10/06/2014 Elsevier Interactive Patient Education  2017 [employer].

## 2021-12-23 ENCOUNTER — Other Ambulatory Visit: Payer: Self-pay | Admitting: Internal Medicine

## 2021-12-25 ENCOUNTER — Other Ambulatory Visit: Payer: Self-pay | Admitting: Internal Medicine

## 2022-01-28 ENCOUNTER — Encounter: Payer: Self-pay | Admitting: Gastroenterology

## 2022-02-04 ENCOUNTER — Encounter: Payer: Self-pay | Admitting: Internal Medicine

## 2022-02-04 ENCOUNTER — Ambulatory Visit (INDEPENDENT_AMBULATORY_CARE_PROVIDER_SITE_OTHER): Payer: Medicare HMO | Admitting: Internal Medicine

## 2022-02-04 ENCOUNTER — Telehealth: Payer: Self-pay

## 2022-02-04 DIAGNOSIS — G8929 Other chronic pain: Secondary | ICD-10-CM

## 2022-02-04 DIAGNOSIS — M8580 Other specified disorders of bone density and structure, unspecified site: Secondary | ICD-10-CM | POA: Diagnosis not present

## 2022-02-04 DIAGNOSIS — E034 Atrophy of thyroid (acquired): Secondary | ICD-10-CM

## 2022-02-04 DIAGNOSIS — R634 Abnormal weight loss: Secondary | ICD-10-CM

## 2022-02-04 DIAGNOSIS — E890 Postprocedural hypothyroidism: Secondary | ICD-10-CM | POA: Diagnosis not present

## 2022-02-04 DIAGNOSIS — M5440 Lumbago with sciatica, unspecified side: Secondary | ICD-10-CM

## 2022-02-04 MED ORDER — OXYCODONE HCL ER 40 MG PO T12A: 40.0000 mg | EXTENDED_RELEASE_TABLET | Freq: Four times a day (QID) | ORAL | 0 refills | Status: DC

## 2022-02-04 MED ORDER — OXYCODONE HCL ER 80 MG PO T12A: 80.0000 mg | EXTENDED_RELEASE_TABLET | Freq: Four times a day (QID) | ORAL | 0 refills | Status: DC

## 2022-02-04 NOTE — Assessment & Plan Note (Signed)
Wt Readings from Last 3 Encounters:  02/04/22 129 lb (58.5 kg)  11/06/21 126 lb 8 oz (57.4 kg)  09/06/21 130 lb (59 kg)

## 2022-02-04 NOTE — Assessment & Plan Note (Signed)
Cont on Levothroid 

## 2022-02-04 NOTE — Progress Notes (Signed)
Subjective:  Patient ID: Abigail Wilson, female    DOB: 1961-02-04  Age: 61 y.o. MRN: [id number]  CC: Follow-up   HPI Abigail Wilson presents for LBP, hypothyroidism, asthma f/u  Outpatient Medications Prior to Visit  Medication Sig Dispense Refill   albuterol (VENTOLIN HFA) 108 (90 Base) MCG/ACT inhaler Inhale 1-2 puffs into the lungs every 6 (six) hours as needed for wheezing or shortness of breath. 8.5 each 1   aspirin 81 MG EC tablet Take 81 mg by mouth daily.     Cholecalciferol 3000 units TABS Take 2 tablets by mouth daily.     COENZYME Q-10 PO Take 200 mg by mouth daily.     fluticasone (FLONASE) 50 MCG/ACT nasal spray shake liquid & use 2 sprays in each nostril daily 16 g 5   furosemide (LASIX) 40 MG tablet TAKE ONE TABLET BY MOUTH DAILY 90 tablet 1   levothyroxine (SYNTHROID) 100 MCG tablet TAKE ONE TABLET BY MOUTH DAILY 90 tablet 3   ondansetron (ZOFRAN-ODT) 8 MG disintegrating tablet Take 1 tablet (8 mg total) by mouth every 8 (eight) hours as needed for nausea or vomiting. 20 tablet 0   potassium chloride SA (KLOR-CON M) 20 MEQ tablet TAKE ONE TABLET BY MOUTH EVERY DAY 30 tablet 2   Red Yeast Rice 600 MG CAPS 1 po qd 100 capsule 3   oxyCODONE (OXYCONTIN) 40 mg 12 hr tablet Take 1 tablet (40 mg total) by mouth every 6 (six) hours. Please fill on or after 11/14/21 120 tablet 0   oxyCODONE (OXYCONTIN) 40 mg 12 hr tablet Take 1 tablet (40 mg total) by mouth every 6 (six) hours. 120 tablet 0   oxyCODONE (OXYCONTIN) 40 mg 12 hr tablet Take 1 tablet (40 mg total) by mouth every 6 (six) hours. 120 tablet 0   oxyCODONE (OXYCONTIN) 80 mg 12 hr tablet Take 1 tablet (80 mg total) by mouth every 6 (six) hours. 120 tablet 0   oxyCODONE (OXYCONTIN) 80 mg 12 hr tablet Take 1 tablet (80 mg total) by mouth every 6 (six) hours. 120 tablet 0   oxyCODONE (OXYCONTIN) 80 mg 12 hr tablet Take 1 tablet (80 mg total) by mouth every 6 (six) hours. 120 tablet 0   No facility-administered  medications prior to visit.    ROS: Review of Systems  Constitutional:  Negative for activity change, appetite change, chills, fatigue and unexpected weight change.  HENT:  Negative for congestion, mouth sores and sinus pressure.   Eyes:  Negative for visual disturbance.  Respiratory:  Negative for cough and chest tightness.   Gastrointestinal:  Negative for abdominal pain and nausea.  Genitourinary:  Negative for difficulty urinating, frequency and vaginal pain.  Musculoskeletal:  Positive for back pain. Negative for gait problem.  Skin:  Negative for pallor and rash.  Neurological:  Negative for dizziness, tremors, weakness, numbness and headaches.  Psychiatric/Behavioral:  Negative for confusion and sleep disturbance. The patient is not nervous/anxious.    Objective:  BP 138/82 (BP Location: Left Arm, Patient Position: Sitting, Cuff Size: Normal)   Pulse 69   Temp 97.8 F (36.6 C) (Oral)   Ht '5\' 5"'$  (1.651 m)   Wt 129 lb (58.5 kg)   SpO2 95%   BMI 21.47 kg/m   BP Readings from Last 3 Encounters:  02/04/22 138/82  11/06/21 116/70  09/06/21 128/78    Wt Readings from Last 3 Encounters:  02/04/22 129 lb (58.5 kg)  11/06/21 126 lb 8 oz (57.4  kg)  09/06/21 130 lb (59 kg)    Physical Exam Constitutional:      General: She is not in acute distress.    Appearance: She is well-developed.  HENT:     Head: Normocephalic.     Right Ear: External ear normal.     Left Ear: External ear normal.     Nose: Nose normal.  Eyes:     General:        Right eye: No discharge.        Left eye: No discharge.     Conjunctiva/sclera: Conjunctivae normal.     Pupils: Pupils are equal, round, and reactive to light.  Neck:     Thyroid: No thyromegaly.     Vascular: No JVD.     Trachea: No tracheal deviation.  Cardiovascular:     Rate and Rhythm: Normal rate and regular rhythm.     Heart sounds: Normal heart sounds.  Pulmonary:     Effort: No respiratory distress.     Breath sounds:  No stridor. No wheezing.  Abdominal:     General: Bowel sounds are normal. There is no distension.     Palpations: Abdomen is soft. There is no mass.     Tenderness: There is no abdominal tenderness. There is no guarding or rebound.  Musculoskeletal:        General: No tenderness.     Cervical back: Normal range of motion and neck supple. No rigidity.  Lymphadenopathy:     Cervical: No cervical adenopathy.  Skin:    Findings: No erythema or rash.  Neurological:     Cranial Nerves: No cranial nerve deficit.     Motor: No abnormal muscle tone.     Coordination: Coordination normal.     Deep Tendon Reflexes: Reflexes normal.  Psychiatric:        Behavior: Behavior normal.        Thought Content: Thought content normal.        Judgment: Judgment normal.    Lab Results  Component Value Date   WBC 8.7 09/06/2021   HGB 13.8 09/06/2021   HCT 39.2 09/06/2021   PLT 229 09/06/2021   GLUCOSE 101 (H) 09/06/2021   CHOL 204 (H) 11/18/2019   TRIG 55.0 11/18/2019   HDL 101.60 11/18/2019   LDLCALC 91 11/18/2019   ALT 18 09/06/2021   AST 27 09/06/2021   NA 136 09/06/2021   K 3.6 09/06/2021   CL 101 09/06/2021   CREATININE 0.72 09/06/2021   BUN 13 09/06/2021   CO2 28 09/06/2021   TSH 4.87 (H) 11/13/2020    No results found.  Assessment & Plan:   Problem List Items Addressed This Visit     Hypothyroidism    Cont on Levothroid      Relevant Medications   oxyCODONE (OXYCONTIN) 40 mg 12 hr tablet   oxyCODONE (OXYCONTIN) 40 mg 12 hr tablet   oxyCODONE (OXYCONTIN) 80 mg 12 hr tablet   oxyCODONE (OXYCONTIN) 80 mg 12 hr tablet   oxyCODONE (OXYCONTIN) 80 mg 12 hr tablet   LOW BACK PAIN    Chronic Continue with oxycodone/Oxycontin Rx - long term  Potential benefits of a long term opioids use as well as potential risks (i.e. addiction risk, apnea etc) and complications (i.e. Somnolence, constipation and others) were explained to the patient and were aknowledged.      Relevant  Medications   oxyCODONE (OXYCONTIN) 40 mg 12 hr tablet   oxyCODONE (OXYCONTIN) 40 mg 12  hr tablet   oxyCODONE (OXYCONTIN) 40 mg 12 hr tablet   oxyCODONE (OXYCONTIN) 80 mg 12 hr tablet   oxyCODONE (OXYCONTIN) 80 mg 12 hr tablet   oxyCODONE (OXYCONTIN) 80 mg 12 hr tablet   Osteopenia    On Vit D A lot of exercise       Loss of weight    Wt Readings from Last 3 Encounters:  02/04/22 129 lb (58.5 kg)  11/06/21 126 lb 8 oz (57.4 kg)  09/06/21 130 lb (59 kg)           Meds ordered this encounter  Medications   oxyCODONE (OXYCONTIN) 40 mg 12 hr tablet    Sig: Take 1 tablet (40 mg total) by mouth every 6 (six) hours. Please fill on or after 04/13/22    Dispense:  120 tablet    Refill:  0   oxyCODONE (OXYCONTIN) 40 mg 12 hr tablet    Sig: Take 1 tablet (40 mg total) by mouth every 6 (six) hours.    Dispense:  120 tablet    Refill:  0    Please fill on or after 02/12/22 Dx: M54.5 and G89.4   oxyCODONE (OXYCONTIN) 40 mg 12 hr tablet    Sig: Take 1 tablet (40 mg total) by mouth every 6 (six) hours.    Dispense:  120 tablet    Refill:  0    Please fill on or after 03/14/22. Dx: M54.5   oxyCODONE (OXYCONTIN) 80 mg 12 hr tablet    Sig: Take 1 tablet (80 mg total) by mouth every 6 (six) hours.    Dispense:  120 tablet    Refill:  0    Please fill on or after 04/13/22   oxyCODONE (OXYCONTIN) 80 mg 12 hr tablet    Sig: Take 1 tablet (80 mg total) by mouth every 6 (six) hours.    Dispense:  120 tablet    Refill:  0    Please fill on or after 02/12/2022. DX code:M54.5   oxyCODONE (OXYCONTIN) 80 mg 12 hr tablet    Sig: Take 1 tablet (80 mg total) by mouth every 6 (six) hours.    Dispense:  120 tablet    Refill:  0    Please fill on or after 03/14/22      Follow-up: Return in about 3 months (around 05/07/2022) for a follow-up visit.  Walker Kehr, MD

## 2022-02-04 NOTE — Telephone Encounter (Signed)
Record release request from [facility]

## 2022-02-04 NOTE — Assessment & Plan Note (Signed)
Chronic Continue with oxycodone/Oxycontin Rx - long term  Potential benefits of a long term opioids use as well as potential risks (i.e. addiction risk, apnea etc) and complications (i.e. Somnolence, constipation and others) were explained to the patient and were aknowledged. 

## 2022-02-04 NOTE — Assessment & Plan Note (Signed)
On Vit D A lot of exercise

## 2022-02-26 ENCOUNTER — Ambulatory Visit (AMBULATORY_SURGERY_CENTER): Payer: Self-pay | Admitting: *Deleted

## 2022-02-26 VITALS — Ht 65.0 in | Wt 131.0 lb

## 2022-02-26 DIAGNOSIS — Z86010 Personal history of colonic polyps: Secondary | ICD-10-CM

## 2022-02-26 MED ORDER — NA SULFATE-K SULFATE-MG SULF 17.5-3.13-1.6 GM/177ML PO SOLN: 1.0000 | Freq: Once | ORAL | 0 refills | Status: AC

## 2022-02-26 NOTE — Progress Notes (Signed)
No egg or soy allergy known to patient  No issues known to pt with past sedation with any surgeries or procedures Patient denies ever being told they had issues or difficulty with intubation  No FH of Malignant Hyperthermia Pt is not on diet pills Pt is not on  home 02  Pt is not on blood thinners  Pt states issues with constipation due to meds- has a BM daily -soft - uses Senna and Colace PRN  No A fib or A flutter   NO PA's for preps discussed with pt In PV today  Discussed with pt there will be an out-of-pocket cost for prep and that varies from $0 to 70 +  dollars - pt verbalized understanding  Pt instructed to use Singlecare.com or GoodRx for a price reduction on prep   Pt given the option in PV today for Golytely prep verses  alternative prep  ( Suprep/Plenvu)-  Pt is aware the Golytely has more volume but is more cost effective and the Suprep/Plenvu is less volume but may cost $90-150.  Pt voiced understanding of this and choose Suprep  Prep.

## 2022-03-17 ENCOUNTER — Other Ambulatory Visit: Payer: Self-pay | Admitting: Internal Medicine

## 2022-03-19 ENCOUNTER — Encounter: Payer: Self-pay | Admitting: Gastroenterology

## 2022-03-25 ENCOUNTER — Encounter: Payer: Self-pay | Admitting: Certified Registered Nurse Anesthetist

## 2022-03-26 ENCOUNTER — Ambulatory Visit (AMBULATORY_SURGERY_CENTER): Payer: Medicare HMO | Admitting: Gastroenterology

## 2022-03-26 ENCOUNTER — Encounter: Payer: Self-pay | Admitting: Gastroenterology

## 2022-03-26 VITALS — BP 111/72 | HR 60 | Temp 98.6°F | Resp 18 | Ht 65.0 in | Wt 131.0 lb

## 2022-03-26 DIAGNOSIS — D120 Benign neoplasm of cecum: Secondary | ICD-10-CM

## 2022-03-26 DIAGNOSIS — D123 Benign neoplasm of transverse colon: Secondary | ICD-10-CM | POA: Diagnosis not present

## 2022-03-26 DIAGNOSIS — Z09 Encounter for follow-up examination after completed treatment for conditions other than malignant neoplasm: Secondary | ICD-10-CM | POA: Diagnosis not present

## 2022-03-26 DIAGNOSIS — J45909 Unspecified asthma, uncomplicated: Secondary | ICD-10-CM | POA: Diagnosis not present

## 2022-03-26 DIAGNOSIS — D122 Benign neoplasm of ascending colon: Secondary | ICD-10-CM

## 2022-03-26 DIAGNOSIS — Z86010 Personal history of colonic polyps: Secondary | ICD-10-CM

## 2022-03-26 DIAGNOSIS — D124 Benign neoplasm of descending colon: Secondary | ICD-10-CM

## 2022-03-26 MED ORDER — SODIUM CHLORIDE 0.9 % IV SOLN: 500.0000 mL | INTRAVENOUS | Status: DC

## 2022-03-26 NOTE — Patient Instructions (Signed)
**  Handouts given on polyps and Hemorrhoids**   YOU HAD AN ENDOSCOPIC PROCEDURE TODAY AT [facility]:   Refer to the procedure report that was given to you for any specific questions about what was found during the examination.  If the procedure report does not answer your questions, please call your gastroenterologist to clarify.  If you requested that your care partner not be given the details of your procedure findings, then the procedure report has been included in a sealed envelope for you to review at your convenience later.  YOU SHOULD EXPECT: Some feelings of bloating in the abdomen. Passage of more gas than usual.  Walking can help get rid of the air that was put into your GI tract during the procedure and reduce the bloating. If you had a lower endoscopy (such as a colonoscopy or flexible sigmoidoscopy) you may notice spotting of blood in your stool or on the toilet paper. If you underwent a bowel prep for your procedure, you may not have a normal bowel movement for a few days.  Please Note:  You might notice some irritation and congestion in your nose or some drainage.  This is from the oxygen used during your procedure.  There is no need for concern and it should clear up in a day or so.  SYMPTOMS TO REPORT IMMEDIATELY:  Following lower endoscopy (colonoscopy or flexible sigmoidoscopy):  Excessive amounts of blood in the stool  Significant tenderness or worsening of abdominal pains  Swelling of the abdomen that is new, acute  Fever of 100F or higher  For urgent or emergent issues, a gastroenterologist can be reached at any hour by calling [phone]. Do not use MyChart messaging for urgent concerns.    DIET:  We do recommend a small meal at first, but then you may proceed to your regular diet.  Drink plenty of fluids but you should avoid alcoholic beverages for 24 hours.  ACTIVITY:  You should plan to take it easy for the rest of today and you should NOT DRIVE  or use heavy machinery until tomorrow (because of the sedation medicines used during the test).    FOLLOW UP: Our staff will call the number listed on your records the next business day following your procedure.  We will call around 7:15- 8:00 am to check on you and address any questions or concerns that you may have regarding the information given to you following your procedure. If we do not reach you, we will leave a message.  If you develop any symptoms (ie: fever, flu-like symptoms, shortness of breath, cough etc.) before then, please call [phone].  If you test positive for Covid 19 in the 2 weeks post procedure, please call and report this information to [place].    If any biopsies were taken you will be contacted by phone or by letter within the next 1-3 weeks.  Please call us at [phone] if you have not heard about the biopsies in 3 weeks.    SIGNATURES/CONFIDENTIALITY: You and/or your care partner have signed paperwork which will be entered into your electronic medical record.  These signatures attest to the fact that that the information above on your After Visit Summary has been reviewed and is understood.  Full responsibility of the confidentiality of this discharge information lies with you and/or your care-partner.

## 2022-03-26 NOTE — Progress Notes (Signed)
Report given to PACU, vss 

## 2022-03-26 NOTE — Progress Notes (Signed)
History & Physical  Primary Care Physician:  Cassandria Anger, MD Primary Gastroenterologist: Lucio Edward, MD  CHIEF COMPLAINT:   Personal history of colon polyps   HPI: Abigail Wilson is a 61 y.o. female with a personal history of adenomatous colon polyps for surveillance colonoscopy.   Past Medical History:  Diagnosis Date   Asthmatic bronchitis    Mild   Cataract    forming   Depression    Hepatitis C    Hyperthyroidism    s/p 131Iodine Rx 2007   Hypothyroidism    LBP (low back pain)    Menopause    OA (osteoarthritis of spine)    OA (osteoarthritis)    Osteoporosis     Past Surgical History:  Procedure Laterality Date   CESAREAN SECTION     COLONOSCOPY     COLONOSCOPY W/ POLYPECTOMY      Prior to Admission medications   Medication Sig Start Date End Date Taking? Authorizing Provider  aspirin 81 MG EC tablet Take 81 mg by mouth daily.   Yes [provider]  B Complex-C (SUPER B COMPLEX PO) Take by mouth.   Yes [provider]  Cholecalciferol 3000 units TABS Take 2 tablets by mouth daily.   Yes [provider]  COENZYME Q-10 PO Take 200 mg by mouth daily.   Yes [provider]  docusate sodium (COLACE) 100 MG capsule Take 100 mg by mouth 2 (two) times daily.   Yes [provider]  fluticasone (FLONASE) 50 MCG/ACT nasal spray shake liquid & use 2 sprays in each nostril daily 12/25/21  Yes Plotnikov, Evie Lacks, MD  furosemide (LASIX) 40 MG tablet TAKE ONE TABLET BY MOUTH DAILY 12/23/21  Yes Plotnikov, Evie Lacks, MD  levothyroxine (SYNTHROID) 100 MCG tablet TAKE ONE TABLET BY MOUTH DAILY 08/05/21  Yes Plotnikov, Evie Lacks, MD  oxyCODONE (OXYCONTIN) 40 mg 12 hr tablet Take 1 tablet (40 mg total) by mouth every 6 (six) hours. Please fill on or after 04/13/22 02/04/22  Yes Plotnikov, Evie Lacks, MD  oxyCODONE (OXYCONTIN) 40 mg 12 hr tablet Take 1 tablet (40 mg total) by mouth every 6 (six) hours. 02/04/22  Yes Plotnikov,  Evie Lacks, MD  potassium chloride SA (KLOR-CON M) 20 MEQ tablet Take 1 tablet (20 mEq total) by mouth daily. Annual appt is due in w/labs must see provider for future refills 03/17/22  Yes Plotnikov, Evie Lacks, MD  Red Yeast Rice 600 MG CAPS 1 po qd 11/11/18  Yes Plotnikov, Evie Lacks, MD  ondansetron (ZOFRAN-ODT) 8 MG disintegrating tablet Take 1 tablet (8 mg total) by mouth every 8 (eight) hours as needed for nausea or vomiting. Patient not taking: Reported on 03/12/3661 94/76/54   Delora Fuel, MD  oxyCODONE (OXYCONTIN) 40 mg 12 hr tablet Take 1 tablet (40 mg total) by mouth every 6 (six) hours. 02/04/22   Plotnikov, Evie Lacks, MD  oxyCODONE (OXYCONTIN) 80 mg 12 hr tablet Take 1 tablet (80 mg total) by mouth every 6 (six) hours. 02/04/22   Plotnikov, Evie Lacks, MD  oxyCODONE (OXYCONTIN) 80 mg 12 hr tablet Take 1 tablet (80 mg total) by mouth every 6 (six) hours. 02/04/22   Plotnikov, Evie Lacks, MD  oxyCODONE (OXYCONTIN) 80 mg 12 hr tablet Take 1 tablet (80 mg total) by mouth every 6 (six) hours. 02/04/22   Plotnikov, Evie Lacks, MD  Sennosides (SENNA LAX PO) Take by mouth.    [provider]    Current Outpatient Medications  Medication Sig Dispense Refill   aspirin 81 MG EC tablet Take 81 mg by mouth daily.     B Complex-C (SUPER B COMPLEX PO) Take by mouth.     Cholecalciferol 3000 units TABS Take 2 tablets by mouth daily.     COENZYME Q-10 PO Take 200 mg by mouth daily.     docusate sodium (COLACE) 100 MG capsule Take 100 mg by mouth 2 (two) times daily.     fluticasone (FLONASE) 50 MCG/ACT nasal spray shake liquid & use 2 sprays in each nostril daily 16 g 5   furosemide (LASIX) 40 MG tablet TAKE ONE TABLET BY MOUTH DAILY 90 tablet 1   levothyroxine (SYNTHROID) 100 MCG tablet TAKE ONE TABLET BY MOUTH DAILY 90 tablet 3   oxyCODONE (OXYCONTIN) 40 mg 12 hr tablet Take 1 tablet (40 mg total) by mouth every 6 (six) hours. Please fill on or after 04/13/22 120 tablet 0   oxyCODONE (OXYCONTIN) 40  mg 12 hr tablet Take 1 tablet (40 mg total) by mouth every 6 (six) hours. 120 tablet 0   potassium chloride SA (KLOR-CON M) 20 MEQ tablet Take 1 tablet (20 mEq total) by mouth daily. Annual appt is due in w/labs must see provider for future refills 30 tablet 0   Red Yeast Rice 600 MG CAPS 1 po qd 100 capsule 3   ondansetron (ZOFRAN-ODT) 8 MG disintegrating tablet Take 1 tablet (8 mg total) by mouth every 8 (eight) hours as needed for nausea or vomiting. (Patient not taking: Reported on 02/26/2022) 20 tablet 0   oxyCODONE (OXYCONTIN) 40 mg 12 hr tablet Take 1 tablet (40 mg total) by mouth every 6 (six) hours. 120 tablet 0   oxyCODONE (OXYCONTIN) 80 mg 12 hr tablet Take 1 tablet (80 mg total) by mouth every 6 (six) hours. 120 tablet 0   oxyCODONE (OXYCONTIN) 80 mg 12 hr tablet Take 1 tablet (80 mg total) by mouth every 6 (six) hours. 120 tablet 0   oxyCODONE (OXYCONTIN) 80 mg 12 hr tablet Take 1 tablet (80 mg total) by mouth every 6 (six) hours. 120 tablet 0   Sennosides (SENNA LAX PO) Take by mouth.     Current Facility-Administered Medications  Medication Dose Route Frequency Provider Last Rate Last Admin   0.9 %  sodium chloride infusion  500 mL Intravenous Continuous Ladene Artist, MD        Allergies as of 03/26/2022 - Review Complete 03/26/2022  Allergen Reaction Noted   Nsaids  11/04/2010   Trazodone hcl  11/04/2010    Family History  Problem Relation Age of Onset   Colon polyps Father    Heart disease Father    Hyperlipidemia Father    Hypertension Father    Diabetes Father    Cancer Father    Hypertension Other    Colon cancer Neg Hx    Pancreatic cancer Neg Hx    Stomach cancer Neg Hx    Esophageal cancer Neg Hx     Social History   Socioeconomic History   Marital status: Married    Spouse name: Not on file   Number of children: Not on file   Years of education: Not on file   Highest education level: Not on file  Occupational History   Occupation: retired     Fish farm manager: UNEMPLOYED  Tobacco Use   Smoking status: Former    Packs/day: 0.25    Types: Cigarettes    Quit date: 03/31/2013  Years since quitting: 8.9   Smokeless tobacco: Former   Tobacco comments:    Past hx vape   Vaping Use   Vaping Use: Some days  Substance and Sexual Activity   Alcohol use: Not Currently    Comment: OCC    Drug use: No   Sexual activity: Not Currently    Comment: 1st intercourse- 15, partners- 84, married- 48 yrs   Other Topics Concern   Not on file  Social History Narrative   Not on file   Social Determinants of Health   Financial Resource Strain: Low Risk  (11/19/2021)   Overall Financial Resource Strain (CARDIA)    Difficulty of Paying Living Expenses: Not hard at all  Food Insecurity: No Food Insecurity (11/19/2021)   Hunger Vital Sign    Worried About Running Out of Food in the Last Year: Never true    Ran Out of Food in the Last Year: Never true  Transportation Needs: No Transportation Needs (11/19/2021)   PRAPARE - Hydrologist (Medical): No    Lack of Transportation (Non-Medical): No  Physical Activity: Sufficiently Active (11/19/2021)   Exercise Vital Sign    Days of Exercise per Week: 5 days    Minutes of Exercise per Session: 30 min  Stress: No Stress Concern Present (11/19/2021)   [place]    Feeling of Stress : Not at all  Social Connections: [place] (11/19/2021)   Social Connection and Isolation Panel [NHANES]    Frequency of Communication with Friends and Family: More than three times a week    Frequency of Social Gatherings with Friends and Family: Once a week    Attends Religious Services: More than 4 times per year    Active Member of [employer] or Organizations: Yes    Attends Music therapist: More than 4 times per year    Marital Status: Married  Human resources officer Violence: Not At Risk (11/19/2021)   Humiliation, Afraid,  Rape, and Kick questionnaire    Fear of Current or Ex-Partner: No    Emotionally Abused: No    Physically Abused: No    Sexually Abused: No    Review of Systems:  All systems reviewed were negative except where noted in HPI.   Physical Exam: General:  Alert, well-developed, in NAD Head:  Normocephalic and atraumatic. Eyes:  Sclera clear, no icterus.   Conjunctiva pink. Ears:  Normal auditory acuity. Mouth:  No deformity or lesions.  Neck:  Supple; no masses . Lungs:  Clear throughout to auscultation.   No wheezes, crackles, or rhonchi. No acute distress. Heart:  Regular rate and rhythm; no murmurs. Abdomen:  Soft, nondistended, nontender. No masses, hepatomegaly. No obvious masses.  Normal bowel .    Rectal:  Deferred   Msk:  Symmetrical without gross deformities.. Pulses:  Normal pulses noted. Extremities:  Without edema. Neurologic:  Alert and  oriented x4;  grossly normal neurologically. Skin:  Intact without significant lesions or rashes. Cervical Nodes:  No significant cervical adenopathy. Psych:  Alert and cooperative. Normal mood and affect.  Impression / Plan:   Personal history of adenomatous colon polyps for surveillance colonoscopy.  Pricilla Riffle. Fuller Plan  03/26/2022, 10:33 AM See Shea Evans, [facility], to contact our on call provider

## 2022-03-26 NOTE — Progress Notes (Signed)
Pt's states no medical or surgical changes since previsit or office visit. 

## 2022-03-26 NOTE — Op Note (Signed)
[facility] Patient Name: Abigail Wilson Procedure Date: 03/26/2022 10:32 AM MRN: [id number] Endoscopist: Ladene Artist , MD Age: 61 Referring MD:  Date of Birth: 04/02/1961 Gender: Female Account #: [id number] Procedure:                Colonoscopy Indications:              Surveillance: Personal history of adenomatous                            polyps on last colonoscopy 5 years ago Medicines:                Monitored Anesthesia Care Procedure:                Pre-Anesthesia Assessment:                           - Prior to the procedure, a History and Physical                            was performed, and patient medications and                            allergies were reviewed. The patient's tolerance of                            previous anesthesia was also reviewed. The risks                            and benefits of the procedure and the sedation                            options and risks were discussed with the patient.                            All questions were answered, and informed consent                            was obtained. Prior Anticoagulants: The patient has                            taken no previous anticoagulant or antiplatelet                            agents. ASA Grade Assessment: III - A patient with                            severe systemic disease. After reviewing the risks                            and benefits, the patient was deemed in                            satisfactory condition to undergo the procedure.  After obtaining informed consent, the colonoscope                            was passed under direct vision. Throughout the                            procedure, the patient's blood pressure, pulse, and                            oxygen saturations were monitored continuously. The                            Olympus PCF-H190DL (#[id number]) Colonoscope was                            introduced through the  anus and advanced to the the                            cecum, identified by appendiceal orifice and                            ileocecal valve. The ileocecal valve, appendiceal                            orifice, and rectum were photographed. The quality                            of the bowel preparation was adequate. The                            colonoscopy was performed without difficulty. The                            patient tolerated the procedure well. Scope In: 10:41:46 AM Scope Out: 11:00:06 AM Scope Withdrawal Time: 0 hours 14 minutes 31 seconds  Total Procedure Duration: 0 hours 18 minutes 20 seconds  Findings:                 The perianal and digital rectal examinations were                            normal.                           Seven sessile polyps were found in the descending                            colon (2), transverse colon (1), ascending colon                            (2) and cecum (2). The polyps were 6 to 8 mm in                            size. These polyps were removed with a cold snare.  Resection and retrieval were complete.                           A single small localized angiodysplastic lesion                            without bleeding was found in the cecum.                           A diffuse area of mild melanosis was found in the                            entire colon.                           Internal hemorrhoids were found during                            retroflexion. The hemorrhoids were small and Grade                            I (internal hemorrhoids that do not prolapse).                           The exam was otherwise without abnormality on                            direct and retroflexion views. Complications:            No immediate complications. Estimated blood loss:                            None. Estimated Blood Loss:     Estimated blood loss: none. Impression:               - Seven 6 to 8 mm  polyps in the descending colon,                            in the transverse colon, in the ascending colon and                            in the cecum, removed with a cold snare. Resected                            and retrieved.                           - Small cecum AVM.                           - Mild melanosis in the colon.                           - Internal hemorrhoids.                           -  The examination was otherwise normal on direct                            and retroflexion views. Recommendation:           - Repeat colonoscopy, likely 3 years, after studies                            are complete for surveillance based on pathology                            results.                           - Patient has a contact number available for                            emergencies. The signs and symptoms of potential                            delayed complications were discussed with the                            patient. Return to normal activities tomorrow.                            Written discharge instructions were provided to the                            patient.                           - Resume previous diet.                           - Continue present medications.                           - Await pathology results. Ladene Artist, MD 03/26/2022 11:12:12 AM This report has been signed electronically.

## 2022-03-26 NOTE — Progress Notes (Signed)
Called to room to assist during endoscopic procedure.  Patient ID and intended procedure confirmed with present staff. Received instructions for my participation in the procedure from the performing physician.  

## 2022-03-27 ENCOUNTER — Telehealth: Payer: Self-pay

## 2022-03-27 NOTE — Telephone Encounter (Signed)
Left message on follow up call. 

## 2022-04-08 ENCOUNTER — Encounter: Payer: Self-pay | Admitting: Gastroenterology

## 2022-05-07 ENCOUNTER — Encounter: Payer: Self-pay | Admitting: Internal Medicine

## 2022-05-07 ENCOUNTER — Ambulatory Visit (INDEPENDENT_AMBULATORY_CARE_PROVIDER_SITE_OTHER): Payer: Medicare HMO | Admitting: Internal Medicine

## 2022-05-07 DIAGNOSIS — E890 Postprocedural hypothyroidism: Secondary | ICD-10-CM

## 2022-05-07 DIAGNOSIS — I2583 Coronary atherosclerosis due to lipid rich plaque: Secondary | ICD-10-CM

## 2022-05-07 DIAGNOSIS — G8929 Other chronic pain: Secondary | ICD-10-CM

## 2022-05-07 DIAGNOSIS — M5440 Lumbago with sciatica, unspecified side: Secondary | ICD-10-CM | POA: Diagnosis not present

## 2022-05-07 DIAGNOSIS — E034 Atrophy of thyroid (acquired): Secondary | ICD-10-CM

## 2022-05-07 DIAGNOSIS — M545 Low back pain, unspecified: Secondary | ICD-10-CM

## 2022-05-07 DIAGNOSIS — K635 Polyp of colon: Secondary | ICD-10-CM | POA: Insufficient documentation

## 2022-05-07 DIAGNOSIS — D1724 Benign lipomatous neoplasm of skin and subcutaneous tissue of left leg: Secondary | ICD-10-CM

## 2022-05-07 DIAGNOSIS — I2510 Atherosclerotic heart disease of native coronary artery without angina pectoris: Secondary | ICD-10-CM | POA: Diagnosis not present

## 2022-05-07 DIAGNOSIS — D124 Benign neoplasm of descending colon: Secondary | ICD-10-CM | POA: Diagnosis not present

## 2022-05-07 MED ORDER — OXYCODONE HCL ER 40 MG PO T12A: 40.0000 mg | EXTENDED_RELEASE_TABLET | Freq: Four times a day (QID) | ORAL | 0 refills | Status: DC

## 2022-05-07 MED ORDER — OXYCODONE HCL ER 80 MG PO T12A: 80.0000 mg | EXTENDED_RELEASE_TABLET | Freq: Four times a day (QID) | ORAL | 0 refills | Status: DC

## 2022-05-07 MED ORDER — NALOXONE HCL 4 MG/0.1ML NA LIQD: NASAL | 2 refills | Status: AC

## 2022-05-07 NOTE — Assessment & Plan Note (Signed)
Chronic Continue with oxycodone/Oxycontin Rx - long term  Potential benefits of a long term opioids use as well as potential risks (i.e. addiction risk, apnea etc) and complications (i.e. Somnolence, constipation and others) were explained to the patient and were aknowledged. 

## 2022-05-07 NOTE — Progress Notes (Signed)
Subjective:  Patient ID: Abigail Wilson, female    DOB: 07/13/1961  Age: 61 y.o. MRN: [id number]  CC: Follow-up (3 month f/u)   HPI Abigail Wilson presents for LBP, colon polyps, Hypothyroidism  Outpatient Medications Prior to Visit  Medication Sig Dispense Refill   aspirin 81 MG EC tablet Take 81 mg by mouth daily.     B Complex-C (SUPER B COMPLEX PO) Take by mouth.     Cholecalciferol 3000 units TABS Take 2 tablets by mouth daily.     COENZYME Q-10 PO Take 200 mg by mouth daily.     docusate sodium (COLACE) 100 MG capsule Take 100 mg by mouth 2 (two) times daily.     fluticasone (FLONASE) 50 MCG/ACT nasal spray shake liquid & use 2 sprays in each nostril daily 16 g 5   furosemide (LASIX) 40 MG tablet TAKE ONE TABLET BY MOUTH DAILY 90 tablet 1   levothyroxine (SYNTHROID) 100 MCG tablet TAKE ONE TABLET BY MOUTH DAILY 90 tablet 3   potassium chloride SA (KLOR-CON M) 20 MEQ tablet Take 1 tablet (20 mEq total) by mouth daily. Annual appt is due in w/labs must see provider for future refills 30 tablet 0   Red Yeast Rice 600 MG CAPS 1 po qd 100 capsule 3   Sennosides (SENNA LAX PO) Take by mouth.     oxyCODONE (OXYCONTIN) 40 mg 12 hr tablet Take 1 tablet (40 mg total) by mouth every 6 (six) hours. Please fill on or after 04/13/22 120 tablet 0   oxyCODONE (OXYCONTIN) 40 mg 12 hr tablet Take 1 tablet (40 mg total) by mouth every 6 (six) hours. 120 tablet 0   ondansetron (ZOFRAN-ODT) 8 MG disintegrating tablet Take 1 tablet (8 mg total) by mouth every 8 (eight) hours as needed for nausea or vomiting. (Patient not taking: Reported on 02/26/2022) 20 tablet 0   No facility-administered medications prior to visit.    ROS: Review of Systems  Constitutional:  Negative for activity change, appetite change, chills, fatigue and unexpected weight change.  HENT:  Negative for congestion, mouth sores and sinus pressure.   Eyes:  Negative for visual disturbance.  Respiratory:  Negative for cough  and chest tightness.   Gastrointestinal:  Negative for abdominal pain and nausea.  Genitourinary:  Negative for difficulty urinating, frequency and vaginal pain.  Musculoskeletal:  Positive for back pain. Negative for gait problem.  Skin:  Negative for pallor and rash.  Neurological:  Negative for dizziness, tremors, weakness, numbness and headaches.  Psychiatric/Behavioral:  Negative for confusion and sleep disturbance.     Objective:  BP 122/74 (BP Location: Left Arm)   Pulse 65   Temp 98.3 F (36.8 C) (Oral)   Ht '5\' 5"'$  (1.651 m)   Wt 132 lb 3.2 oz (60 kg)   SpO2 97%   BMI 22.00 kg/m   BP Readings from Last 3 Encounters:  05/07/22 122/74  03/26/22 111/72  02/04/22 138/82    Wt Readings from Last 3 Encounters:  05/07/22 132 lb 3.2 oz (60 kg)  03/26/22 131 lb (59.4 kg)  02/26/22 131 lb (59.4 kg)    Physical Exam Constitutional:      General: She is not in acute distress.    Appearance: She is well-developed.  HENT:     Head: Normocephalic.     Right Ear: External ear normal.     Left Ear: External ear normal.     Nose: Nose normal.  Eyes:  General:        Right eye: No discharge.        Left eye: No discharge.     Conjunctiva/sclera: Conjunctivae normal.     Pupils: Pupils are equal, round, and reactive to light.  Neck:     Thyroid: No thyromegaly.     Vascular: No JVD.     Trachea: No tracheal deviation.  Cardiovascular:     Rate and Rhythm: Normal rate and regular rhythm.     Heart sounds: Normal heart sounds.  Pulmonary:     Effort: No respiratory distress.     Breath sounds: No stridor. No wheezing.  Abdominal:     General: Bowel sounds are normal. There is no distension.     Palpations: Abdomen is soft. There is no mass.     Tenderness: There is no abdominal tenderness. There is no guarding or rebound.  Musculoskeletal:        General: Tenderness present.     Cervical back: Normal range of motion and neck supple. No rigidity.  Lymphadenopathy:      Cervical: No cervical adenopathy.  Skin:    Findings: No erythema or rash.  Neurological:     Mental Status: She is oriented to person, place, and time.     Cranial Nerves: No cranial nerve deficit.     Motor: No abnormal muscle tone.     Coordination: Coordination normal.     Deep Tendon Reflexes: Reflexes normal.  Psychiatric:        Behavior: Behavior normal.        Thought Content: Thought content normal.        Judgment: Judgment normal.   LS w/pain  Lab Results  Component Value Date   WBC 8.7 09/06/2021   HGB 13.8 09/06/2021   HCT 39.2 09/06/2021   PLT 229 09/06/2021   GLUCOSE 101 (H) 09/06/2021   CHOL 204 (H) 11/18/2019   TRIG 55.0 11/18/2019   HDL 101.60 11/18/2019   LDLCALC 91 11/18/2019   ALT 18 09/06/2021   AST 27 09/06/2021   NA 136 09/06/2021   K 3.6 09/06/2021   CL 101 09/06/2021   CREATININE 0.72 09/06/2021   BUN 13 09/06/2021   CO2 28 09/06/2021   TSH 4.87 (H) 11/13/2020    No results found.  Assessment & Plan:   Problem List Items Addressed This Visit     Benign lipomatous neoplasm of skin and subcutaneous tissue of left leg   Relevant Medications   oxyCODONE (OXYCONTIN) 80 mg 12 hr tablet   oxyCODONE (OXYCONTIN) 80 mg 12 hr tablet   oxyCODONE (OXYCONTIN) 80 mg 12 hr tablet   oxyCODONE (OXYCONTIN) 40 mg 12 hr tablet   Colon polyps    03/2022, due in 2026 x7 TA -- Dr Fuller Plan      Hypothyroidism    Cont on Levothroid      Relevant Medications   oxyCODONE (OXYCONTIN) 40 mg 12 hr tablet   oxyCODONE (OXYCONTIN) 80 mg 12 hr tablet   oxyCODONE (OXYCONTIN) 80 mg 12 hr tablet   oxyCODONE (OXYCONTIN) 80 mg 12 hr tablet   oxyCODONE (OXYCONTIN) 40 mg 12 hr tablet   LOW BACK PAIN    Chronic Continue with oxycodone/Oxycontin Rx - long term  Potential benefits of a long term opioids use as well as potential risks (i.e. addiction risk, apnea etc) and complications (i.e. Somnolence, constipation and others) were explained to the patient and were  aknowledged.  Relevant Medications   oxyCODONE (OXYCONTIN) 40 mg 12 hr tablet   oxyCODONE (OXYCONTIN) 40 mg 12 hr tablet   oxyCODONE (OXYCONTIN) 80 mg 12 hr tablet   oxyCODONE (OXYCONTIN) 80 mg 12 hr tablet   oxyCODONE (OXYCONTIN) 80 mg 12 hr tablet   oxyCODONE (OXYCONTIN) 40 mg 12 hr tablet   Other Visit Diagnoses     Bilateral low back pain without sciatica       Relevant Medications   oxyCODONE (OXYCONTIN) 40 mg 12 hr tablet   oxyCODONE (OXYCONTIN) 40 mg 12 hr tablet   oxyCODONE (OXYCONTIN) 80 mg 12 hr tablet   oxyCODONE (OXYCONTIN) 80 mg 12 hr tablet   oxyCODONE (OXYCONTIN) 80 mg 12 hr tablet   oxyCODONE (OXYCONTIN) 40 mg 12 hr tablet         Meds ordered this encounter  Medications   oxyCODONE (OXYCONTIN) 40 mg 12 hr tablet    Sig: Take 1 tablet (40 mg total) by mouth every 6 (six) hours. Please fill on or after 07/12/22    Dispense:  120 tablet    Refill:  0   oxyCODONE (OXYCONTIN) 40 mg 12 hr tablet    Sig: Take 1 tablet (40 mg total) by mouth every 6 (six) hours.    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    Dispense:  120 tablet    Refill:  0    Please fill on or after 07/12/22   oxyCODONE (OXYCONTIN) 80 mg 12 hr tablet    Sig: Take 1 tablet (80 mg total) by mouth every 6 (six) hours.    Dispense:  120 tablet    Refill:  0    Please fill on or after 06/12/22   oxyCODONE (OXYCONTIN) 80 mg 12 hr tablet    Sig: Take 1 tablet (80 mg total) by mouth every 6 (six) hours.    Dispense:  120 tablet    Refill:  0    Please fill on or after 05/13/22   oxyCODONE (OXYCONTIN) 40 mg 12 hr tablet    Sig: Take 1 tablet (40 mg total) by mouth every 6 (six) hours.    Dispense:  120 tablet    Refill:  0    Please fill on or after 05/13/22      Follow-up: Return in about 3 months (around 08/07/2022) for a  follow-up visit.  Walker Kehr, MD

## 2022-05-07 NOTE — Assessment & Plan Note (Signed)
03/2022, due in 2026 x7 TA -- Dr Fuller Plan

## 2022-05-07 NOTE — Assessment & Plan Note (Signed)
Cont on Levothroid 

## 2022-06-23 ENCOUNTER — Other Ambulatory Visit: Payer: Self-pay | Admitting: Internal Medicine

## 2022-06-25 ENCOUNTER — Other Ambulatory Visit: Payer: Self-pay | Admitting: *Deleted

## 2022-06-25 MED ORDER — FUROSEMIDE 40 MG PO TABS: 40.0000 mg | ORAL_TABLET | Freq: Every day | ORAL | 1 refills | Status: DC

## 2022-07-30 ENCOUNTER — Other Ambulatory Visit: Payer: Self-pay | Admitting: Internal Medicine

## 2022-08-06 ENCOUNTER — Ambulatory Visit (INDEPENDENT_AMBULATORY_CARE_PROVIDER_SITE_OTHER): Payer: Medicare HMO | Admitting: Internal Medicine

## 2022-08-06 ENCOUNTER — Encounter: Payer: Self-pay | Admitting: Internal Medicine

## 2022-08-06 VITALS — BP 122/70 | HR 67 | Temp 98.1°F | Ht 65.0 in | Wt 130.4 lb

## 2022-08-06 DIAGNOSIS — D1724 Benign lipomatous neoplasm of skin and subcutaneous tissue of left leg: Secondary | ICD-10-CM | POA: Diagnosis not present

## 2022-08-06 DIAGNOSIS — G8929 Other chronic pain: Secondary | ICD-10-CM

## 2022-08-06 DIAGNOSIS — M545 Low back pain, unspecified: Secondary | ICD-10-CM | POA: Diagnosis not present

## 2022-08-06 DIAGNOSIS — E890 Postprocedural hypothyroidism: Secondary | ICD-10-CM

## 2022-08-06 DIAGNOSIS — M5440 Lumbago with sciatica, unspecified side: Secondary | ICD-10-CM | POA: Diagnosis not present

## 2022-08-06 DIAGNOSIS — I2510 Atherosclerotic heart disease of native coronary artery without angina pectoris: Secondary | ICD-10-CM | POA: Diagnosis not present

## 2022-08-06 DIAGNOSIS — E034 Atrophy of thyroid (acquired): Secondary | ICD-10-CM | POA: Diagnosis not present

## 2022-08-06 DIAGNOSIS — I2583 Coronary atherosclerosis due to lipid rich plaque: Secondary | ICD-10-CM | POA: Diagnosis not present

## 2022-08-06 DIAGNOSIS — E876 Hypokalemia: Secondary | ICD-10-CM | POA: Insufficient documentation

## 2022-08-06 MED ORDER — OXYCODONE HCL ER 80 MG PO T12A: 80.0000 mg | EXTENDED_RELEASE_TABLET | Freq: Four times a day (QID) | ORAL | 0 refills | Status: DC

## 2022-08-06 MED ORDER — OXYCODONE HCL ER 40 MG PO T12A: 40.0000 mg | EXTENDED_RELEASE_TABLET | Freq: Four times a day (QID) | ORAL | 0 refills | Status: DC

## 2022-08-06 NOTE — Assessment & Plan Note (Signed)
On Kdur

## 2022-08-06 NOTE — Assessment & Plan Note (Signed)
Chronic Continue with oxycodone/Oxycontin Rx - long term  Potential benefits of a long term opioids use as well as potential risks (i.e. addiction risk, apnea etc) and complications (i.e. Somnolence, constipation and others) were explained to the patient and were aknowledged. 

## 2022-08-06 NOTE — Progress Notes (Signed)
Subjective:  Patient ID: Abigail Wilson, female    DOB: 07/12/1961  Age: 61 y.o. MRN: [id number]  CC: Follow-up (3 MONTH F/U)   HPI Abigail Wilson presents for LBP, hypothyroidism, CAD, hypokalemia  Outpatient Medications Prior to Visit  Medication Sig Dispense Refill   aspirin 81 MG EC tablet Take 81 mg by mouth daily.     B Complex-C (SUPER B COMPLEX PO) Take by mouth.     Cholecalciferol 3000 units TABS Take 2 tablets by mouth daily.     COENZYME Q-10 PO Take 200 mg by mouth daily.     docusate sodium (COLACE) 100 MG capsule Take 100 mg by mouth 2 (two) times daily.     fluticasone (FLONASE) 50 MCG/ACT nasal spray shake liquid & use 2 sprays in each nostril daily 16 g 5   furosemide (LASIX) 40 MG tablet Take 1 tablet (40 mg total) by mouth daily. 90 tablet 1   levothyroxine (SYNTHROID) 100 MCG tablet TAKE ONE TABLET BY MOUTH DAILY 90 tablet 3   naloxone (NARCAN) nasal spray 4 mg/0.1 mL 1 actuation in one nostril once. May repeat in 2-3 min 1 each 2   potassium chloride SA (KLOR-CON M) 20 MEQ tablet Take 1 tablet (20 mEq total) by mouth daily. 90 tablet 3   Red Yeast Rice 600 MG CAPS 1 po qd 100 capsule 3   Sennosides (SENNA LAX PO) Take by mouth.     oxyCODONE (OXYCONTIN) 40 mg 12 hr tablet Take 1 tablet (40 mg total) by mouth every 6 (six) hours. Please fill on or after 07/12/22 120 tablet 0   oxyCODONE (OXYCONTIN) 40 mg 12 hr tablet Take 1 tablet (40 mg total) by mouth every 6 (six) hours. 120 tablet 0   oxyCODONE (OXYCONTIN) 40 mg 12 hr tablet Take 1 tablet (40 mg total) by mouth every 6 (six) hours. 120 tablet 0   oxyCODONE (OXYCONTIN) 80 mg 12 hr tablet Take 1 tablet (80 mg total) by mouth every 6 (six) hours. 120 tablet 0   oxyCODONE (OXYCONTIN) 80 mg 12 hr tablet Take 1 tablet (80 mg total) by mouth every 6 (six) hours. 120 tablet 0   oxyCODONE (OXYCONTIN) 80 mg 12 hr tablet Take 1 tablet (80 mg total) by mouth every 6 (six) hours. 120 tablet 0   No  facility-administered medications prior to visit.    ROS: Review of Systems  Constitutional:  Negative for activity change, appetite change, chills, fatigue and unexpected weight change.  HENT:  Negative for congestion, mouth sores and sinus pressure.   Eyes:  Negative for visual disturbance.  Respiratory:  Negative for cough and chest tightness.   Gastrointestinal:  Negative for abdominal pain and nausea.  Genitourinary:  Negative for difficulty urinating, frequency and vaginal pain.  Musculoskeletal:  Positive for back pain. Negative for gait problem.  Skin:  Negative for pallor and rash.  Neurological:  Negative for dizziness, tremors, weakness, numbness and headaches.  Psychiatric/Behavioral:  Negative for confusion and sleep disturbance. The patient is not nervous/anxious.     Objective:  BP 122/70 (BP Location: Left Arm)   Pulse 67   Temp 98.1 F (36.7 C) (Oral)   Ht '5\' 5"'$  (1.651 m)   Wt 130 lb 6.4 oz (59.1 kg)   SpO2 96%   BMI 21.70 kg/m   BP Readings from Last 3 Encounters:  08/06/22 122/70  05/07/22 122/74  03/26/22 111/72    Wt Readings from Last 3 Encounters:  08/06/22  130 lb 6.4 oz (59.1 kg)  05/07/22 132 lb 3.2 oz (60 kg)  03/26/22 131 lb (59.4 kg)    Physical Exam Constitutional:      General: She is not in acute distress.    Appearance: She is well-developed. She is obese.  HENT:     Head: Normocephalic.     Right Ear: External ear normal.     Left Ear: External ear normal.     Nose: Nose normal.  Eyes:     General:        Right eye: No discharge.        Left eye: No discharge.     Conjunctiva/sclera: Conjunctivae normal.     Pupils: Pupils are equal, round, and reactive to light.  Neck:     Thyroid: No thyromegaly.     Vascular: No JVD.     Trachea: No tracheal deviation.  Cardiovascular:     Rate and Rhythm: Normal rate and regular rhythm.     Heart sounds: Normal heart sounds.  Pulmonary:     Effort: No respiratory distress.     Breath  sounds: No stridor. No wheezing.  Abdominal:     General: Bowel sounds are normal. There is no distension.     Palpations: Abdomen is soft. There is no mass.     Tenderness: There is no abdominal tenderness. There is no guarding or rebound.  Musculoskeletal:        General: Tenderness present.     Cervical back: Normal range of motion and neck supple. No rigidity.  Lymphadenopathy:     Cervical: No cervical adenopathy.  Skin:    Findings: No erythema or rash.  Neurological:     Mental Status: She is oriented to person, place, and time.     Cranial Nerves: No cranial nerve deficit.     Motor: No abnormal muscle tone.     Coordination: Coordination normal.     Deep Tendon Reflexes: Reflexes normal.  Psychiatric:        Behavior: Behavior normal.        Thought Content: Thought content normal.        Judgment: Judgment normal.     Lab Results  Component Value Date   WBC 8.7 09/06/2021   HGB 13.8 09/06/2021   HCT 39.2 09/06/2021   PLT 229 09/06/2021   GLUCOSE 101 (H) 09/06/2021   CHOL 204 (H) 11/18/2019   TRIG 55.0 11/18/2019   HDL 101.60 11/18/2019   LDLCALC 91 11/18/2019   ALT 18 09/06/2021   AST 27 09/06/2021   NA 136 09/06/2021   K 3.6 09/06/2021   CL 101 09/06/2021   CREATININE 0.72 09/06/2021   BUN 13 09/06/2021   CO2 28 09/06/2021   TSH 4.87 (H) 11/13/2020    No results found.  Assessment & Plan:   Problem List Items Addressed This Visit     LOW BACK PAIN    Chronic Continue with oxycodone/Oxycontin Rx - long term  Potential benefits of a long term opioids use as well as potential risks (i.e. addiction risk, apnea etc) and complications (i.e. Somnolence, constipation and others) were explained to the patient and were aknowledged.      Relevant Medications   oxyCODONE (OXYCONTIN) 40 mg 12 hr tablet   oxyCODONE (OXYCONTIN) 40 mg 12 hr tablet   oxyCODONE (OXYCONTIN) 40 mg 12 hr tablet   oxyCODONE (OXYCONTIN) 80 mg 12 hr tablet   oxyCODONE (OXYCONTIN) 80  mg 12 hr tablet  oxyCODONE (OXYCONTIN) 80 mg 12 hr tablet   Hypothyroidism - Primary    Cont on Levothroid      Relevant Medications   oxyCODONE (OXYCONTIN) 40 mg 12 hr tablet   oxyCODONE (OXYCONTIN) 40 mg 12 hr tablet   oxyCODONE (OXYCONTIN) 80 mg 12 hr tablet   oxyCODONE (OXYCONTIN) 80 mg 12 hr tablet   oxyCODONE (OXYCONTIN) 80 mg 12 hr tablet   Hypokalemia    On Kdur      Coronary atherosclerosis     ASA, Red Rice yeast qd      Benign lipomatous neoplasm of skin and subcutaneous tissue of left leg   Relevant Medications   oxyCODONE (OXYCONTIN) 40 mg 12 hr tablet   oxyCODONE (OXYCONTIN) 80 mg 12 hr tablet   oxyCODONE (OXYCONTIN) 80 mg 12 hr tablet   oxyCODONE (OXYCONTIN) 80 mg 12 hr tablet      Meds ordered this encounter  Medications   oxyCODONE (OXYCONTIN) 40 mg 12 hr tablet    Sig: Take 1 tablet (40 mg total) by mouth every 6 (six) hours.    Dispense:  120 tablet    Refill:  0    Please fill on or after 08/11/22   oxyCODONE (OXYCONTIN) 40 mg 12 hr tablet    Sig: Take 1 tablet (40 mg total) by mouth every 6 (six) hours.    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    Dispense:  120 tablet    Refill:  0    Please fill on or after 10/10/22   oxyCODONE (OXYCONTIN) 80 mg 12 hr tablet    Sig: Take 1 tablet (80 mg total) by mouth every 6 (six) hours.    Dispense:  120 tablet    Refill:  0    Please fill on or after 08/11/22   oxyCODONE (OXYCONTIN) 80 mg 12 hr tablet    Sig: Take 1 tablet (80 mg total) by mouth every 6 (six) hours.    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    Dispense:  120 tablet    Refill:  0    Please fill on or after  10/10/22      Follow-up: No follow-ups on file.  Walker Kehr, MD

## 2022-08-06 NOTE — Assessment & Plan Note (Signed)
Cont on Levothroid 

## 2022-08-06 NOTE — Assessment & Plan Note (Signed)
  ASA, Red Rice yeast qd

## 2022-09-23 ENCOUNTER — Telehealth: Payer: Self-pay | Admitting: Internal Medicine

## 2022-09-23 ENCOUNTER — Other Ambulatory Visit (HOSPITAL_COMMUNITY): Payer: Self-pay

## 2022-09-23 NOTE — Telephone Encounter (Signed)
Patient called and said that a PA needs to be done on the oxycodone 80 mg. 120 tablets a month. & oxy codone 40 mg. 120 tablets a month   Pharmacy:  [facility] in [place]

## 2022-09-24 ENCOUNTER — Other Ambulatory Visit (HOSPITAL_COMMUNITY): Payer: Self-pay

## 2022-09-24 NOTE — Telephone Encounter (Signed)
Per test claim, refill too soon on insurance. Can start PA process if necessary on 10/01/2022

## 2022-09-24 NOTE — Telephone Encounter (Signed)
Proceed w/ PA on 10/01/2022.Marland KitchenJohny Wilson

## 2022-10-02 NOTE — Telephone Encounter (Signed)
PT calls today in regards to this PA. Informed her that process of PA could not even be started until 01/17. She expressed understanding and I let her know whether it would be approved would still be dependent on the insurance.  CB if needed: [phone]

## 2022-10-03 ENCOUNTER — Other Ambulatory Visit (HOSPITAL_COMMUNITY): Payer: Self-pay

## 2022-10-03 ENCOUNTER — Telehealth: Payer: Self-pay

## 2022-10-03 NOTE — Telephone Encounter (Signed)
Pharmacy Patient Advocate Encounter   Received notification that prior authorization for OxyCONTIN '40MG'$  er tablets is required/requested.     PA not submitted.   Key [id number]

## 2022-10-03 NOTE — Telephone Encounter (Signed)
Pharmacy Patient Advocate Encounter   Received notification that prior authorization for OxyCONTIN '80MG'$  er tablets is required/requested.   PA submitted on 10/03/22 to (ins) [employer] via [place] Status is pending

## 2022-10-06 ENCOUNTER — Other Ambulatory Visit (HOSPITAL_COMMUNITY): Payer: Self-pay

## 2022-10-06 NOTE — Telephone Encounter (Signed)
Patient Advocate Encounter  Prior Authorization for OxyCONTIN '80MG'$  er tablets has been approved.    PA# [id number] Effective dates: 09/15/22 through 09/15/23

## 2022-10-08 NOTE — Telephone Encounter (Signed)
The patient has oxycodone and OxyContin prescriptions to be filled on 10/10/2022 issue before.  Keep ROV. Thanks

## 2022-11-05 ENCOUNTER — Encounter: Payer: Self-pay | Admitting: Internal Medicine

## 2022-11-05 ENCOUNTER — Ambulatory Visit (INDEPENDENT_AMBULATORY_CARE_PROVIDER_SITE_OTHER): Payer: Medicare HMO | Admitting: Internal Medicine

## 2022-11-05 VITALS — BP 122/68 | HR 69 | Temp 98.0°F | Ht 65.0 in | Wt 130.0 lb

## 2022-11-05 DIAGNOSIS — D1724 Benign lipomatous neoplasm of skin and subcutaneous tissue of left leg: Secondary | ICD-10-CM | POA: Diagnosis not present

## 2022-11-05 DIAGNOSIS — E034 Atrophy of thyroid (acquired): Secondary | ICD-10-CM

## 2022-11-05 DIAGNOSIS — E890 Postprocedural hypothyroidism: Secondary | ICD-10-CM

## 2022-11-05 DIAGNOSIS — G8929 Other chronic pain: Secondary | ICD-10-CM | POA: Diagnosis not present

## 2022-11-05 DIAGNOSIS — M545 Low back pain, unspecified: Secondary | ICD-10-CM | POA: Diagnosis not present

## 2022-11-05 DIAGNOSIS — I2583 Coronary atherosclerosis due to lipid rich plaque: Secondary | ICD-10-CM

## 2022-11-05 DIAGNOSIS — I2510 Atherosclerotic heart disease of native coronary artery without angina pectoris: Secondary | ICD-10-CM

## 2022-11-05 DIAGNOSIS — Z0000 Encounter for general adult medical examination without abnormal findings: Secondary | ICD-10-CM

## 2022-11-05 MED ORDER — OXYCODONE HCL ER 80 MG PO T12A: 80.0000 mg | EXTENDED_RELEASE_TABLET | Freq: Four times a day (QID) | ORAL | 0 refills | Status: DC

## 2022-11-05 MED ORDER — OXYCODONE HCL ER 40 MG PO T12A: 40.0000 mg | EXTENDED_RELEASE_TABLET | Freq: Four times a day (QID) | ORAL | 0 refills | Status: DC

## 2022-11-05 NOTE — Assessment & Plan Note (Signed)
Chronic Continue with oxycodone/Oxycontin Rx - long term  Potential benefits of a long term opioids use as well as potential risks (i.e. addiction risk, apnea etc) and complications (i.e. Somnolence, constipation and others) were explained to the patient and were aknowledged.

## 2022-11-05 NOTE — Assessment & Plan Note (Signed)
ASA, Krill oil

## 2022-11-05 NOTE — Progress Notes (Signed)
Subjective:  Patient ID: [name], female    DOB: [birth year]  Age: 62 y.o. MRN: [id number]  CC: No chief complaint on file.   HPI RAELYNN BLOT presents for LBP, hypothyroidism, CAD  Outpatient Medications Prior to Visit  Medication Sig Dispense Refill   aspirin 81 MG EC tablet Take 81 mg by mouth daily.     B Complex-C (SUPER B COMPLEX PO) Take by mouth.     Cholecalciferol 3000 units TABS Take 2 tablets by mouth daily.     COENZYME Q-10 PO Take 200 mg by mouth daily.     docusate sodium (COLACE) 100 MG capsule Take 100 mg by mouth 2 (two) times daily.     fluticasone (FLONASE) 50 MCG/ACT nasal spray shake liquid & use 2 sprays in each nostril daily 16 g 5   furosemide (LASIX) 40 MG tablet Take 1 tablet (40 mg total) by mouth daily. 90 tablet 1   levothyroxine (SYNTHROID) 100 MCG tablet TAKE ONE TABLET BY MOUTH DAILY 90 tablet 3   naloxone (NARCAN) nasal spray 4 mg/0.1 mL 1 actuation in one nostril once. May repeat in 2-3 min 1 each 2   potassium chloride SA (KLOR-CON M) 20 MEQ tablet Take 1 tablet (20 mEq total) by mouth daily. 90 tablet 3   Red Yeast Rice 600 MG CAPS 1 po qd 100 capsule 3   Sennosides (SENNA LAX PO) Take by mouth.     oxyCODONE (OXYCONTIN) 80 mg 12 hr tablet Take 1 tablet (80 mg total) by mouth every 6 (six) hours. 120 tablet 0   oxyCODONE (OXYCONTIN) 40 mg 12 hr tablet Take 1 tablet (40 mg total) by mouth every 6 (six) hours. (Patient not taking: Reported on 11/05/2022) 120 tablet 0   oxyCODONE (OXYCONTIN) 40 mg 12 hr tablet Take 1 tablet (40 mg total) by mouth every 6 (six) hours. (Patient not taking: Reported on 11/05/2022) 120 tablet 0   oxyCODONE (OXYCONTIN) 40 mg 12 hr tablet Take 1 tablet (40 mg total) by mouth every 6 (six) hours. (Patient not taking: Reported on 11/05/2022) 120 tablet 0   oxyCODONE (OXYCONTIN) 80 mg 12 hr tablet Take 1 tablet (80 mg total) by mouth every 6 (six) hours. (Patient not taking: Reported on 11/05/2022) 120 tablet 0    oxyCODONE (OXYCONTIN) 80 mg 12 hr tablet Take 1 tablet (80 mg total) by mouth every 6 (six) hours. (Patient not taking: Reported on 11/05/2022) 120 tablet 0   No facility-administered medications prior to visit.    ROS: Review of Systems  Constitutional:  Negative for activity change, appetite change, chills, fatigue and unexpected weight change.  HENT:  Negative for congestion, mouth sores and sinus pressure.   Eyes:  Negative for visual disturbance.  Respiratory:  Negative for cough and chest tightness.   Gastrointestinal:  Negative for abdominal pain and nausea.  Genitourinary:  Negative for difficulty urinating, frequency and vaginal pain.  Musculoskeletal:  Positive for back pain. Negative for gait problem.  Skin:  Negative for pallor and rash.  Neurological:  Negative for dizziness, tremors, weakness, numbness and headaches.  Psychiatric/Behavioral:  Negative for confusion and sleep disturbance.     Objective:  BP 122/68 (BP Location: Left Arm, Patient Position: Sitting, Cuff Size: Normal)   Pulse 69   Temp 98 F (36.7 C) (Oral)   Ht 5' 5"$  (1.651 m)   Wt 130 lb (59 kg)   SpO2 98%   BMI 21.63 kg/m   BP Readings from  Last 3 Encounters:  11/05/22 122/68  08/06/22 122/70  05/07/22 122/74    Wt Readings from Last 3 Encounters:  11/05/22 130 lb (59 kg)  08/06/22 130 lb 6.4 oz (59.1 kg)  05/07/22 132 lb 3.2 oz (60 kg)    Physical Exam Constitutional:      General: She is not in acute distress.    Appearance: She is well-developed. She is obese.  HENT:     Head: Normocephalic.     Right Ear: External ear normal.     Left Ear: External ear normal.     Nose: Nose normal.  Eyes:     General:        Right eye: No discharge.        Left eye: No discharge.     Conjunctiva/sclera: Conjunctivae normal.     Pupils: Pupils are equal, round, and reactive to light.  Neck:     Thyroid: No thyromegaly.     Vascular: No JVD.     Trachea: No tracheal deviation.   Cardiovascular:     Rate and Rhythm: Normal rate and regular rhythm.     Heart sounds: Normal heart sounds.  Pulmonary:     Effort: No respiratory distress.     Breath sounds: No stridor. No wheezing.  Abdominal:     General: Bowel sounds are normal. There is no distension.     Palpations: Abdomen is soft. There is no mass.     Tenderness: There is no abdominal tenderness. There is no guarding or rebound.  Musculoskeletal:        General: Tenderness present.     Cervical back: Normal range of motion and neck supple. No rigidity.     Right lower leg: No edema.     Left lower leg: No edema.  Lymphadenopathy:     Cervical: No cervical adenopathy.  Skin:    Findings: No erythema or rash.  Neurological:     Mental Status: She is oriented to person, place, and time.     Cranial Nerves: No cranial nerve deficit.     Motor: No abnormal muscle tone.     Coordination: Coordination normal.     Deep Tendon Reflexes: Reflexes normal.  Psychiatric:        Behavior: Behavior normal.        Thought Content: Thought content normal.        Judgment: Judgment normal.   LS spine w/pain  Lab Results  Component Value Date   WBC 8.7 09/06/2021   HGB 13.8 09/06/2021   HCT 39.2 09/06/2021   PLT 229 09/06/2021   GLUCOSE 101 (H) 09/06/2021   CHOL 204 (H) 11/18/2019   TRIG 55.0 11/18/2019   HDL 101.60 11/18/2019   LDLCALC 91 11/18/2019   ALT 18 09/06/2021   AST 27 09/06/2021   NA 136 09/06/2021   K 3.6 09/06/2021   CL 101 09/06/2021   CREATININE 0.72 09/06/2021   BUN 13 09/06/2021   CO2 28 09/06/2021   TSH 4.87 (H) 11/13/2020    No results found.  Assessment & Plan:   Problem List Items Addressed This Visit       Cardiovascular and Mediastinum   Coronary atherosclerosis    ASA, Krill oil        Endocrine   Hypothyroidism - Primary    Cont on Levothroid      Relevant Medications   oxyCODONE (OXYCONTIN) 40 mg 12 hr tablet   oxyCODONE (OXYCONTIN) 40 mg 12 hr tablet  oxyCODONE (OXYCONTIN) 80 mg 12 hr tablet   oxyCODONE (OXYCONTIN) 80 mg 12 hr tablet   oxyCODONE (OXYCONTIN) 80 mg 12 hr tablet     Other   LOW BACK PAIN    Chronic Continue with oxycodone/Oxycontin Rx - long term  Potential benefits of a long term opioids use as well as potential risks (i.e. addiction risk, apnea etc) and complications (i.e. Somnolence, constipation and others) were explained to the patient and were aknowledged.      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    Dispense:  120 tablet    Refill:  0    Please fill on or after 11/09/22   oxyCODONE (OXYCONTIN) 40 mg 12 hr tablet    Sig: Take 1 tablet (40 mg total) by mouth every 6 (six) hours.    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    Dispense:  120 tablet    Refill:  0    Please fill on or after 11/09/22   oxyCODONE (OXYCONTIN) 80 mg 12 hr tablet    Sig: Take 1 tablet (80 mg total) by mouth every 6  (six) hours.    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  Walker Kehr, MD

## 2022-11-05 NOTE — Assessment & Plan Note (Signed)
Cont on Levothroid

## 2022-11-13 ENCOUNTER — Telehealth: Payer: Self-pay

## 2022-11-13 NOTE — Telephone Encounter (Signed)
[place] to schedule their annual wellness visit. Appointment made for 11/25/22.  Norton Blizzard, [place] ([facility])  [facility] [phone]

## 2022-11-25 ENCOUNTER — Ambulatory Visit (INDEPENDENT_AMBULATORY_CARE_PROVIDER_SITE_OTHER): Payer: Medicare HMO

## 2022-11-25 VITALS — Ht 65.0 in | Wt 130.0 lb

## 2022-11-25 DIAGNOSIS — Z1231 Encounter for screening mammogram for malignant neoplasm of breast: Secondary | ICD-10-CM

## 2022-11-25 DIAGNOSIS — Z0000 Encounter for general adult medical examination without abnormal findings: Secondary | ICD-10-CM

## 2022-11-25 NOTE — Patient Instructions (Addendum)
Abigail Wilson , Thank you for taking time to come for your Medicare Wellness Visit. I appreciate your ongoing commitment to your health goals. Please review the following plan we discussed and let me know if I can assist you in the future.   These are the goals we discussed:  Goals      Patient Stated     To get better control of my thyroid condition.        This is a list of the screening recommended for you and due dates:  Health Maintenance  Topic Date Due   HIV Screening  Never done   Zoster (Shingles) Vaccine (1 of 2) Never done   Mammogram  04/18/2016   Pap Smear  03/06/2023*   Medicare Annual Wellness Visit  11/25/2023   DTaP/Tdap/Td vaccine (3 - Td or Tdap) 01/01/2025   Colon Cancer Screening  03/26/2025   Flu Shot  Completed   Hepatitis C Screening: USPSTF Recommendation to screen - Ages 18-79 yo.  Completed   HPV Vaccine  Aged Out   COVID-19 Vaccine  Discontinued  *Topic was postponed. The date shown is not the original due date.    Advanced directives: Advance directive discussed with you today. I have provided a copy for you to complete at home and have notarized. Once this is complete please bring a copy in to our office so we can scan it into your chart.   Conditions/risks identified: Aim for 30 minutes of exercise or brisk walking, 6-8 glasses of water, and 5 servings of fruits and vegetables each day.   Next appointment: Follow up in one year for your annual wellness visit.   The number to schedule your mammogram is [phone]  Preventive Care 40-64 Years, Female Preventive care refers to lifestyle choices and visits with your health care provider that can promote health and wellness. What does preventive care include? A yearly physical exam. This is also called an annual well check. Dental exams once or twice a year. Routine eye exams. Ask your health care provider how often you should have your eyes checked. Personal lifestyle choices, including: Daily  care of your teeth and gums. Regular physical activity. Eating a healthy diet. Avoiding tobacco and drug use. Limiting alcohol use. Practicing safe sex. Taking low-dose aspirin daily starting at age 81. Taking vitamin and mineral supplements as recommended by your health care provider. What happens during an annual well check? The services and screenings done by your health care provider during your annual well check will depend on your age, overall health, lifestyle risk factors, and family history of disease. Counseling  Your health care provider may ask you questions about your: Alcohol use. Tobacco use. Drug use. Emotional well-being. Home and relationship well-being. Sexual activity. Eating habits. Work and work Statistician. Method of birth control. Menstrual cycle. Pregnancy history. Screening  You may have the following tests or measurements: Height, weight, and BMI. Blood pressure. Lipid and cholesterol levels. These may be checked every 5 years, or more frequently if you are over 45 years old. Skin check. Lung cancer screening. You may have this screening every year starting at age 39 if you have a 30-pack-year history of smoking and currently smoke or have quit within the past 15 years. Fecal occult blood test (FOBT) of the stool. You may have this test every year starting at age 68. Flexible sigmoidoscopy or colonoscopy. You may have a sigmoidoscopy every 5 years or a colonoscopy every 10 years starting at age 70. Hepatitis  C blood test. Hepatitis B blood test. Sexually transmitted disease (STD) testing. Diabetes screening. This is done by checking your blood sugar (glucose) after you have not eaten for a while (fasting). You may have this done every 1-3 years. Mammogram. This may be done every 1-2 years. Talk to your health care provider about when you should start having regular mammograms. This may depend on whether you have a family history of breast  cancer. BRCA-related cancer screening. This may be done if you have a family history of breast, ovarian, tubal, or peritoneal cancers. Pelvic exam and Pap test. This may be done every 3 years starting at age 74. Starting at age 71, this may be done every 5 years if you have a Pap test in combination with an HPV test. Bone density scan. This is done to screen for osteoporosis. You may have this scan if you are at high risk for osteoporosis. Discuss your test results, treatment options, and if necessary, the need for more tests with your health care provider. Vaccines  Your health care provider may recommend certain vaccines, such as: Influenza vaccine. This is recommended every year. Tetanus, diphtheria, and acellular pertussis (Tdap, Td) vaccine. You may need a Td booster every 10 years. Zoster vaccine. You may need this after age 51. Pneumococcal 13-valent conjugate (PCV13) vaccine. You may need this if you have certain conditions and were not previously vaccinated. Pneumococcal polysaccharide (PPSV23) vaccine. You may need one or two doses if you smoke cigarettes or if you have certain conditions. Talk to your health care provider about which screenings and vaccines you need and how often you need them. This information is not intended to replace advice given to you by your health care provider. Make sure you discuss any questions you have with your health care provider. Document Released: 09/28/2015 Document Revised: 05/21/2016 Document Reviewed: 07/03/2015 Elsevier Interactive Patient Education  2017 [employer] Prevention in the Home Falls can cause injuries. They can happen to people of all ages. There are many things you can do to make your home safe and to help prevent falls. What can I do on the outside of my home? Regularly fix the edges of walkways and driveways and fix any cracks. Remove anything that might make you trip as you walk through a door, such as a raised step  or threshold. Trim any bushes or trees on the path to your home. Use bright outdoor lighting. Clear any walking paths of anything that might make someone trip, such as rocks or tools. Regularly check to see if handrails are loose or broken. Make sure that both sides of any steps have handrails. Any raised decks and porches should have guardrails on the edges. Have any leaves, snow, or ice cleared regularly. Use sand or salt on walking paths during winter. Clean up any spills in your garage right away. This includes oil or grease spills. What can I do in the bathroom? Use night lights. Install grab bars by the toilet and in the tub and shower. Do not use towel bars as grab bars. Use non-skid mats or decals in the tub or shower. If you need to sit down in the shower, use a plastic, non-slip stool. Keep the floor dry. Clean up any water that spills on the floor as soon as it happens. Remove soap buildup in the tub or shower regularly. Attach bath mats securely with double-sided non-slip rug tape. Do not have throw rugs and other things on  the floor that can make you trip. What can I do in the bedroom? Use night lights. Make sure that you have a light by your bed that is easy to reach. Do not use any sheets or blankets that are too big for your bed. They should not hang down onto the floor. Have a firm chair that has side arms. You can use this for support while you get dressed. Do not have throw rugs and other things on the floor that can make you trip. What can I do in the kitchen? Clean up any spills right away. Avoid walking on wet floors. Keep items that you use a lot in easy-to-reach places. If you need to reach something above you, use a strong step stool that has a grab bar. Keep electrical cords out of the way. Do not use floor polish or wax that makes floors slippery. If you must use wax, use non-skid floor wax. Do not have throw rugs and other things on the floor that can make  you trip. What can I do with my stairs? Do not leave any items on the stairs. Make sure that there are handrails on both sides of the stairs and use them. Fix handrails that are broken or loose. Make sure that handrails are as long as the stairways. Check any carpeting to make sure that it is firmly attached to the stairs. Fix any carpet that is loose or worn. Avoid having throw rugs at the top or bottom of the stairs. If you do have throw rugs, attach them to the floor with carpet tape. Make sure that you have a light switch at the top of the stairs and the bottom of the stairs. If you do not have them, ask someone to add them for you. What else can I do to help prevent falls? Wear shoes that: Do not have high heels. Have rubber bottoms. Are comfortable and fit you well. Are closed at the toe. Do not wear sandals. If you use a stepladder: Make sure that it is fully opened. Do not climb a closed stepladder. Make sure that both sides of the stepladder are locked into place. Ask someone to hold it for you, if possible. Clearly mark and make sure that you can see: Any grab bars or handrails. First and last steps. Where the edge of each step is. Use tools that help you move around (mobility aids) if they are needed. These include: Canes. Walkers. Scooters. Crutches. Turn on the lights when you go into a dark area. Replace any light bulbs as soon as they burn out. Set up your furniture so you have a clear path. Avoid moving your furniture around. If any of your floors are uneven, fix them. If there are any pets around you, be aware of where they are. Review your medicines with your doctor. Some medicines can make you feel dizzy. This can increase your chance of falling. Ask your doctor what other things that you can do to help prevent falls. This information is not intended to replace advice given to you by your health care provider. Make sure you discuss any questions you have with your  health care provider. Document Released: 06/28/2009 Document Revised: 02/07/2016 Document Reviewed: 10/06/2014 Elsevier Interactive Patient Education  2017 [employer].

## 2022-11-25 NOTE — Progress Notes (Addendum)
Subjective:   Abigail Wilson is a 62 y.o. female who presents for Medicare Annual (Subsequent) preventive examination.  I connected with  [name] on 11/25/22 by a audio enabled telemedicine application and verified that I am speaking with the correct person using two identifiers.  Patient Location: Home  Provider Location: Home Office  I discussed the limitations of evaluation and management by telemedicine. The patient expressed understanding and agreed to proceed.  Review of Systems     Cardiac Risk Factors include: smoking/ tobacco exposure;sedentary lifestyle     Objective:    Today's Vitals   11/25/22 1438  Weight: 130 lb (59 kg)  Height: '5\' 5"'$  (1.651 m)   Body mass index is 21.63 kg/m.     11/25/2022    7:19 PM 11/19/2021    9:26 AM 09/06/2021    8:34 PM 11/12/2020    3:16 PM 03/27/2017   12:55 PM 03/13/2017    1:30 PM  Advanced Directives  Does Patient Have a Medical Advance Directive? No No No No No No  Would patient like information on creating a medical advance directive? Yes (MAU/Ambulatory/Procedural Areas - Information given) No - Patient declined  No - Patient declined      Current Medications (verified) Outpatient Encounter Medications as of 11/25/2022  Medication Sig   aspirin 81 MG EC tablet Take 81 mg by mouth daily.   B Complex-C (SUPER B COMPLEX PO) Take by mouth.   Cholecalciferol 3000 units TABS Take 2 tablets by mouth daily.   COENZYME Q-10 PO Take 200 mg by mouth daily.   docusate sodium (COLACE) 100 MG capsule Take 100 mg by mouth 2 (two) times daily.   fluticasone (FLONASE) 50 MCG/ACT nasal spray shake liquid & use 2 sprays in each nostril daily   furosemide (LASIX) 40 MG tablet Take 1 tablet (40 mg total) by mouth daily.   levothyroxine (SYNTHROID) 100 MCG tablet TAKE ONE TABLET BY MOUTH DAILY   naloxone (NARCAN) nasal spray 4 mg/0.1 mL 1 actuation in one nostril once. May repeat in 2-3 min   oxyCODONE (OXYCONTIN) 40 mg 12 hr  tablet Take 1 tablet (40 mg total) by mouth every 6 (six) hours.   oxyCODONE (OXYCONTIN) 40 mg 12 hr tablet Take 1 tablet (40 mg total) by mouth every 6 (six) hours.   oxyCODONE (OXYCONTIN) 40 mg 12 hr tablet Take 1 tablet (40 mg total) by mouth every 6 (six) hours.   oxyCODONE (OXYCONTIN) 80 mg 12 hr tablet Take 1 tablet (80 mg total) by mouth every 6 (six) hours.   oxyCODONE (OXYCONTIN) 80 mg 12 hr tablet Take 1 tablet (80 mg total) by mouth every 6 (six) hours.   oxyCODONE (OXYCONTIN) 80 mg 12 hr tablet Take 1 tablet (80 mg total) by mouth every 6 (six) hours.   potassium chloride SA (KLOR-CON M) 20 MEQ tablet Take 1 tablet (20 mEq total) by mouth daily.   Red Yeast Rice 600 MG CAPS 1 po qd   Sennosides (SENNA LAX PO) Take by mouth.   No facility-administered encounter medications on file as of 11/25/2022.    Allergies (verified) Nsaids and Trazodone hcl   History: Past Medical History:  Diagnosis Date   Asthmatic bronchitis    Mild   Cataract    forming   Depression    Hepatitis C    Hyperthyroidism    s/p 131Iodine Rx 2007   Hypothyroidism    LBP (low back pain)    Menopause  OA (osteoarthritis of spine)    OA (osteoarthritis)    Osteoporosis    Past Surgical History:  Procedure Laterality Date   CESAREAN SECTION     COLONOSCOPY     COLONOSCOPY W/ POLYPECTOMY     Family History  Problem Relation Age of Onset   Colon polyps Father    Heart disease Father    Hyperlipidemia Father    Hypertension Father    Diabetes Father    Cancer Father    Hypertension Other    Colon cancer Neg Hx    Pancreatic cancer Neg Hx    Stomach cancer Neg Hx    Esophageal cancer Neg Hx    Social History   Socioeconomic History   Marital status: Married    Spouse name: Not on file   Number of children: Not on file   Years of education: Not on file   Highest education level: Not on file  Occupational History   Occupation: retired    Fish farm manager: UNEMPLOYED  Tobacco Use    Smoking status: Former    Packs/day: 0.25    Types: Cigarettes    Quit date: 03/31/2013    Years since quitting: 9.6   Smokeless tobacco: Former   Tobacco comments:    Past hx vape   Vaping Use   Vaping Use: Some days  Substance and Sexual Activity   Alcohol use: Not Currently    Comment: OCC    Drug use: No   Sexual activity: Not Currently    Comment: 1st intercourse- 15, partners- 73, married- 31 yrs   Other Topics Concern   Not on file  Social History Narrative   Not on file   Social Determinants of Health   Financial Resource Strain: Low Risk  (11/25/2022)   Overall Financial Resource Strain (CARDIA)    Difficulty of Paying Living Expenses: Not hard at all  Food Insecurity: No Food Insecurity (11/25/2022)   Hunger Vital Sign    Worried About Running Out of Food in the Last Year: Never true    [place] in the Last Year: Never true  Transportation Needs: No Transportation Needs (11/25/2022)   PRAPARE - Hydrologist (Medical): No    Lack of Transportation (Non-Medical): No  Physical Activity: Insufficiently Active (11/25/2022)   Exercise Vital Sign    Days of Exercise per Week: 2 days    Minutes of Exercise per Session: 60 min  Stress: No Stress Concern Present (11/25/2022)   [place]    Feeling of Stress : Not at all  Social Connections: [place] (11/25/2022)   Social Connection and Isolation Panel [NHANES]    Frequency of Communication with Friends and Family: More than three times a week    Frequency of Social Gatherings with Friends and Family: Once a week    Attends Religious Services: More than 4 times per year    Active Member of [employer] or Organizations: Yes    Attends Music therapist: More than 4 times per year    Marital Status: Married    Tobacco Counseling Counseling given: Not Answered Tobacco comments: Past hx vape    Clinical  Intake:  Pre-visit preparation completed: Yes  Pain : No/denies pain  Diabetes: No  How often do you need to have someone help you when you read instructions, pamphlets, or other written materials from your doctor or pharmacy?: 1 - Never  Diabetic?No  Interpreter Needed?: No  Information entered by :: Denman George LPN   Activities of Daily Living    11/25/2022    7:19 PM  In your present state of health, do you have any difficulty performing the following activities:  Hearing? 0  Vision? 0  Difficulty concentrating or making decisions? 0  Walking or climbing stairs? 0  Dressing or bathing? 0  Doing errands, shopping? 0  Preparing Food and eating ? N  Using the Toilet? N  In the past six months, have you accidently leaked urine? N  Do you have problems with loss of bowel control? N  Managing your Medications? N  Managing your Finances? N  Housekeeping or managing your Housekeeping? N    Patient Care Team: Plotnikov, Evie Lacks, MD as PCP - General Renato Shin, MD (Inactive) as Attending Physician (Endocrinology) Terrance Mass, MD (Inactive) (Obstetrics and Gynecology) Ladene Artist, MD (Gastroenterology) Clent Jacks, MD as Consulting Physician (Ophthalmology)  Indicate any recent Medical Services you may have received from other than [facility] providers in the past year (date may be approximate).     Assessment:   This is a routine wellness examination for Abigail Wilson.  Hearing/Vision screen Hearing Screening - Comments:: Denies hearing difficulties  Vision Screening - Comments:: Wears rx glasses - up to date with routine eye exams with Dr. Katy Fitch     Dietary issues and exercise activities discussed: Current Exercise Habits: Home exercise routine, Type of exercise: stretching;treadmill;strength training/weights, Time (Minutes): 40, Frequency (Times/Week): 2, Weekly Exercise (Minutes/Week): 80, Intensity: Mild   Goals Addressed   None   Depression  Screen    11/25/2022    7:17 PM 11/05/2022    1:38 PM 11/06/2021    3:11 PM 11/12/2020    3:14 PM 08/14/2020    3:20 PM 04/23/2017    2:22 PM 01/20/2017    3:19 PM  PHQ 2/9 Scores  PHQ - 2 Score 0 0 0 0 0 1 0  PHQ- 9 Score      2 2    Fall Risk    11/25/2022    7:16 PM 11/05/2022    1:38 PM 11/19/2021    9:27 AM 11/06/2021    3:11 PM 11/12/2020    3:16 PM  Fall Risk   Falls in the past year? 0 0 0 0 0  Number falls in past yr: 0 0 0  0  Injury with Fall? 0 0 0  0  Risk for fall due to : No Fall Risks No Fall Risks No Fall Risks  No Fall Risks  Follow up Falls prevention discussed;Education provided;Falls evaluation completed Falls evaluation completed Falls evaluation completed      FALL RISK PREVENTION PERTAINING TO THE HOME:  Any stairs in or around the home? No  If so, are there any without handrails? No  Home free of loose throw rugs in walkways, pet beds, electrical cords, etc? Yes  Adequate lighting in your home to reduce risk of falls? Yes   ASSISTIVE DEVICES UTILIZED TO PREVENT FALLS:  Life alert? No  Use of a cane, walker or w/c? No  Grab bars in the bathroom? Yes  Shower chair or bench in shower? No  Elevated toilet seat or a handicapped toilet? Yes   TIMED UP AND GO:  Was the test performed? No . Telephonic visit   Cognitive Function:        11/25/2022    7:20 PM  6CIT Screen  What Year? 0 points  What month? 0 points  What time? 0 points  Count back from 20 0 points  Months in reverse 0 points  Repeat phrase 0 points  Total Score 0 points    Immunizations Immunization History  Administered Date(s) Administered   H1N1 08/22/2008   Influenza Split 07/22/2011, 07/02/2012   Influenza Whole 06/13/2009, 06/28/2010   Influenza, High Dose Seasonal PF 06/16/2016, 06/23/2017, 07/05/2018   Influenza, Quadrivalent, Recombinant, Inj, Pf 06/15/2022   Influenza,inj,Quad PF,6+ Mos 07/19/2013, 07/17/2014, 07/16/2015, 05/16/2019, 07/14/2020   Influenza,inj,Quad  PF,6-35 Mos 07/09/2021   PNEUMOCOCCAL CONJUGATE-20 05/14/2021   Pneumococcal Conjugate-13 10/02/2014   Pneumococcal Polysaccharide-23 12/09/2011   Td 04/28/2002   Tdap 01/02/2015    TDAP status: Up to date  Flu Vaccine status: Up to date  Pneumococcal vaccine status: Up to date  Covid-19 vaccine status: Information provided on how to obtain vaccines.   Qualifies for Shingles Vaccine? Yes   Zostavax completed No   Shingrix Completed?: No.    Education has been provided regarding the importance of this vaccine. Patient has been advised to call insurance company to determine out of pocket expense if they have not yet received this vaccine. Advised may also receive vaccine at local pharmacy or Health Dept. Verbalized acceptance and understanding.  Screening Tests Health Maintenance  Topic Date Due   HIV Screening  Never done   Zoster Vaccines- Shingrix (1 of 2) Never done   MAMMOGRAM  04/18/2016   PAP SMEAR-Modifier  03/06/2023 (Originally 01/25/2021)   Medicare Annual Wellness (AWV)  11/25/2023   DTaP/Tdap/Td (3 - Td or Tdap) 01/01/2025   COLONOSCOPY (Pts 45-65yr Insurance coverage will need to be confirmed)  03/26/2025   INFLUENZA VACCINE  Completed   Hepatitis C Screening  Completed   HPV VACCINES  Aged Out   COVID-19 Vaccine  Discontinued    Health Maintenance  Health Maintenance Due  Topic Date Due   HIV Screening  Never done   Zoster Vaccines- Shingrix (1 of 2) Never done   MAMMOGRAM  04/18/2016    Colorectal cancer screening: Type of screening: Colonoscopy. Completed 03/26/22. Repeat every 3 years  Mammogram status: Ordered today . Pt provided with contact info and advised to call to schedule appt.   Lung Cancer Screening: (Low Dose CT Chest recommended if Age 63-80years, 30 pack-year currently smoking OR have quit w/in 15years.) does not qualify.   Lung Cancer Screening Referral: n/a  Additional Screening:  Hepatitis C Screening: does qualify; Completed  07/22/16  Vision Screening: Recommended annual ophthalmology exams for early detection of glaucoma and other disorders of the eye. Is the patient up to date with their annual eye exam?  Yes  Who is the provider or what is the name of the office in which the patient attends annual eye exams? Dr. GKaty Fitch If pt is not established with a provider, would they like to be referred to a provider to establish care? No .   Dental Screening: Recommended annual dental exams for proper oral hygiene  Community Resource Referral / Chronic Care Management: CRR required this visit?  No   CCM required this visit?  No      Plan:     I have personally reviewed and noted the following in the patient's chart:   Medical and social history Use of alcohol, tobacco or illicit drugs  Current medications and supplements including opioid prescriptions. Patient is currently taking opioid prescriptions. Information provided to patient regarding non-opioid alternatives. Patient advised to discuss non-opioid treatment plan  with their provider. Functional ability and status Nutritional status Physical activity Advanced directives List of other physicians Hospitalizations, surgeries, and ER visits in previous 12 months Vitals Screenings to include cognitive, depression, and falls Referrals and appointments  In addition, I have reviewed and discussed with patient certain preventive protocols, quality metrics, and best practice recommendations. A written personalized care plan for preventive services as well as general preventive health recommendations were provided to patient.     Vanetta Mulders, [place]   075-GRM   Due to this being a virtual visit, the after visit summary with patients personalized plan was offered to patient via mail or my-chart.  per request, patient was mailed a copy of AVS./   Nurse Notes: No concerns    Medical screening examination/treatment/procedure(s) were performed by  non-physician practitioner and as supervising physician I was immediately available for consultation/collaboration.  I agree with above. Lew Dawes, MD

## 2022-12-22 ENCOUNTER — Other Ambulatory Visit: Payer: Self-pay | Admitting: Internal Medicine

## 2022-12-29 ENCOUNTER — Other Ambulatory Visit: Payer: Self-pay | Admitting: Internal Medicine

## 2023-02-03 ENCOUNTER — Ambulatory Visit: Payer: Medicare HMO | Admitting: Internal Medicine

## 2023-02-03 ENCOUNTER — Ambulatory Visit (INDEPENDENT_AMBULATORY_CARE_PROVIDER_SITE_OTHER): Payer: Medicare HMO | Admitting: Internal Medicine

## 2023-02-03 ENCOUNTER — Encounter: Payer: Self-pay | Admitting: Internal Medicine

## 2023-02-03 VITALS — BP 112/78 | HR 70 | Temp 98.2°F | Ht 65.0 in | Wt 127.0 lb

## 2023-02-03 DIAGNOSIS — I2583 Coronary atherosclerosis due to lipid rich plaque: Secondary | ICD-10-CM

## 2023-02-03 DIAGNOSIS — E034 Atrophy of thyroid (acquired): Secondary | ICD-10-CM

## 2023-02-03 DIAGNOSIS — I2510 Atherosclerotic heart disease of native coronary artery without angina pectoris: Secondary | ICD-10-CM | POA: Diagnosis not present

## 2023-02-03 DIAGNOSIS — G894 Chronic pain syndrome: Secondary | ICD-10-CM

## 2023-02-03 DIAGNOSIS — Z122 Encounter for screening for malignant neoplasm of respiratory organs: Secondary | ICD-10-CM | POA: Diagnosis not present

## 2023-02-03 DIAGNOSIS — D1724 Benign lipomatous neoplasm of skin and subcutaneous tissue of left leg: Secondary | ICD-10-CM | POA: Diagnosis not present

## 2023-02-03 DIAGNOSIS — Z0000 Encounter for general adult medical examination without abnormal findings: Secondary | ICD-10-CM

## 2023-02-03 DIAGNOSIS — R590 Localized enlarged lymph nodes: Secondary | ICD-10-CM

## 2023-02-03 DIAGNOSIS — M545 Low back pain, unspecified: Secondary | ICD-10-CM | POA: Diagnosis not present

## 2023-02-03 DIAGNOSIS — M8580 Other specified disorders of bone density and structure, unspecified site: Secondary | ICD-10-CM

## 2023-02-03 LAB — CBC WITH DIFFERENTIAL/PLATELET
Basophils Absolute: 0 10*3/uL (ref 0.0–0.1)
Basophils Relative: 0.6 % (ref 0.0–3.0)
Eosinophils Absolute: 0.2 10*3/uL (ref 0.0–0.7)
Eosinophils Relative: 3.6 % (ref 0.0–5.0)
HCT: 37.2 % (ref 36.0–46.0)
Hemoglobin: 12.4 g/dL (ref 12.0–15.0)
Lymphocytes Relative: 40.2 % (ref 12.0–46.0)
Lymphs Abs: 2 10*3/uL (ref 0.7–4.0)
MCHC: 33.4 g/dL (ref 30.0–36.0)
MCV: 101.9 fl — ABNORMAL HIGH (ref 78.0–100.0)
Monocytes Absolute: 0.6 10*3/uL (ref 0.1–1.0)
Monocytes Relative: 11.8 % (ref 3.0–12.0)
Neutro Abs: 2.2 10*3/uL (ref 1.4–7.7)
Neutrophils Relative %: 43.8 % (ref 43.0–77.0)
Platelets: 229 10*3/uL (ref 150.0–400.0)
RBC: 3.65 Mil/uL — ABNORMAL LOW (ref 3.87–5.11)
RDW: 14.2 % (ref 11.5–15.5)
WBC: 5 10*3/uL (ref 4.0–10.5)

## 2023-02-03 LAB — COMPREHENSIVE METABOLIC PANEL
ALT: 14 U/L (ref 0–35)
AST: 25 U/L (ref 0–37)
Albumin: 4.1 g/dL (ref 3.5–5.2)
Alkaline Phosphatase: 49 U/L (ref 39–117)
BUN: 17 mg/dL (ref 6–23)
CO2: 30 mEq/L (ref 19–32)
Calcium: 9.3 mg/dL (ref 8.4–10.5)
Chloride: 100 mEq/L (ref 96–112)
Creatinine, Ser: 0.81 mg/dL (ref 0.40–1.20)
GFR: 77.37 mL/min (ref >60.00)
Glucose, Bld: 92 mg/dL (ref 70–99)
Potassium: 4 mEq/L (ref 3.5–5.1)
Sodium: 135 mEq/L (ref 135–145)
Total Bilirubin: 0.4 mg/dL (ref 0.2–1.2)
Total Protein: 6.7 g/dL (ref 6.0–8.3)

## 2023-02-03 LAB — LIPID PANEL
Cholesterol: 193 mg/dL (ref 0–200)
HDL: 97.2 mg/dL (ref >39.00)
LDL Cholesterol: 87 mg/dL (ref 0–99)
NonHDL: 96.1
Total CHOL/HDL Ratio: 2
Triglycerides: 45 mg/dL (ref 0.0–149.0)
VLDL: 9 mg/dL (ref 0.0–40.0)

## 2023-02-03 LAB — TSH: TSH: 4.21 u[IU]/mL (ref 0.35–5.50)

## 2023-02-03 MED ORDER — OXYCODONE HCL ER 80 MG PO T12A
80.0000 mg | EXTENDED_RELEASE_TABLET | Freq: Four times a day (QID) | ORAL | 0 refills | Status: DC
Start: 2023-02-03 — End: 2023-05-06

## 2023-02-03 MED ORDER — OXYCODONE HCL ER 40 MG PO T12A
40.0000 mg | EXTENDED_RELEASE_TABLET | Freq: Four times a day (QID) | ORAL | 0 refills | Status: DC
Start: 2023-02-03 — End: 2023-05-06

## 2023-02-03 MED ORDER — OXYCODONE HCL ER 40 MG PO T12A: 40.0000 mg | EXTENDED_RELEASE_TABLET | Freq: Four times a day (QID) | ORAL | 0 refills | Status: DC

## 2023-02-03 NOTE — Assessment & Plan Note (Signed)
On Rx 

## 2023-02-03 NOTE — Assessment & Plan Note (Signed)
Cont on Levothroid 

## 2023-02-03 NOTE — Progress Notes (Signed)
Subjective:  Patient ID: Abigail Wilson, female    DOB: 10-20-1960  Age: 62 y.o. MRN: [id number]  CC: Follow-up (3 MNTH F/U)   HPI ZYANYA WORMALD presents for LBP, hypothyroidism, dyslipidemia  Outpatient Medications Prior to Visit  Medication Sig Dispense Refill  . aspirin 81 MG EC tablet Take 81 mg by mouth daily.    . B Complex-C (SUPER B COMPLEX PO) Take by mouth.    . Cholecalciferol 3000 units TABS Take 2 tablets by mouth daily.    [name] COENZYME Q-10 PO Take 200 mg by mouth daily.    [name] docusate sodium (COLACE) 100 MG capsule Take 100 mg by mouth 2 (two) times daily.    . fluticasone (FLONASE) 50 MCG/ACT nasal spray shake liquid & use 2 sprays in each nostril daily 16 g 5  . furosemide (LASIX) 40 MG tablet Take 1 tablet (40 mg total) by mouth daily. 90 tablet 1  . levothyroxine (SYNTHROID) 100 MCG tablet TAKE ONE TABLET BY MOUTH DAILY 90 tablet 3  . naloxone (NARCAN) nasal spray 4 mg/0.1 mL 1 actuation in one nostril once. May repeat in 2-3 min 1 each 2  . potassium chloride SA (KLOR-CON M) 20 MEQ tablet Take 1 tablet (20 mEq total) by mouth daily. 90 tablet 3  . Red Yeast Rice 600 MG CAPS 1 po qd 100 capsule 3  . Sennosides (SENNA LAX PO) Take by mouth.    . oxyCODONE (OXYCONTIN) 40 mg 12 hr tablet Take 1 tablet (40 mg total) by mouth every 6 (six) hours. 120 tablet 0  . oxyCODONE (OXYCONTIN) 40 mg 12 hr tablet Take 1 tablet (40 mg total) by mouth every 6 (six) hours. 120 tablet 0  . oxyCODONE (OXYCONTIN) 40 mg 12 hr tablet Take 1 tablet (40 mg total) by mouth every 6 (six) hours. 120 tablet 0  . oxyCODONE (OXYCONTIN) 80 mg 12 hr tablet Take 1 tablet (80 mg total) by mouth every 6 (six) hours. 120 tablet 0  . oxyCODONE (OXYCONTIN) 80 mg 12 hr tablet Take 1 tablet (80 mg total) by mouth every 6 (six) hours. 120 tablet 0  . oxyCODONE (OXYCONTIN) 80 mg 12 hr tablet Take 1 tablet (80 mg total) by mouth every 6 (six) hours. 120 tablet 0   No facility-administered medications  prior to visit.    ROS: Review of Systems  Constitutional:  Negative for activity change, appetite change, chills, fatigue and unexpected weight change.  HENT:  Negative for congestion, mouth sores and sinus pressure.   Eyes:  Negative for visual disturbance.  Respiratory:  Negative for cough and chest tightness.   Gastrointestinal:  Negative for abdominal pain and nausea.  Genitourinary:  Negative for difficulty urinating, frequency and vaginal pain.  Musculoskeletal:  Positive for back pain and gait problem.  Skin:  Negative for pallor and rash.  Neurological:  Negative for dizziness, tremors, weakness, numbness and headaches.  Psychiatric/Behavioral:  Negative for confusion and sleep disturbance.     Objective:  BP 112/78 (BP Location: Right Arm, Patient Position: Sitting, Cuff Size: Normal)   Pulse 70   Temp 98.2 F (36.8 C) (Oral)   Ht 5\' 5"  (1.651 m)   Wt 127 lb (57.6 kg)   SpO2 98%   BMI 21.13 kg/m   BP Readings from Last 3 Encounters:  02/03/23 112/78  11/05/22 122/68  08/06/22 122/70    Wt Readings from Last 3 Encounters:  02/03/23 127 lb (57.6 kg)  11/25/22 130 lb (59  kg)  11/05/22 130 lb (59 kg)    Physical Exam Constitutional:      General: She is not in acute distress.    Appearance: Normal appearance. She is well-developed.  HENT:     Head: Normocephalic.     Right Ear: External ear normal.     Left Ear: External ear normal.     Nose: Nose normal.  Eyes:     General:        Right eye: No discharge.        Left eye: No discharge.     Conjunctiva/sclera: Conjunctivae normal.     Pupils: Pupils are equal, round, and reactive to light.  Neck:     Thyroid: No thyromegaly.     Vascular: No JVD.     Trachea: No tracheal deviation.  Cardiovascular:     Rate and Rhythm: Normal rate and regular rhythm.     Heart sounds: Normal heart sounds.  Pulmonary:     Effort: No respiratory distress.     Breath sounds: No stridor. No wheezing.  Abdominal:      General: Bowel sounds are normal. There is no distension.     Palpations: Abdomen is soft. There is no mass.     Tenderness: There is no abdominal tenderness. There is no guarding or rebound.  Musculoskeletal:        General: Tenderness and deformity present.     Cervical back: Normal range of motion and neck supple. No rigidity.  Lymphadenopathy:     Cervical: No cervical adenopathy.  Skin:    Findings: No erythema or rash.  Neurological:     Cranial Nerves: No cranial nerve deficit.     Motor: No abnormal muscle tone.     Coordination: Coordination normal.     Deep Tendon Reflexes: Reflexes normal.  Psychiatric:        Behavior: Behavior normal.        Thought Content: Thought content normal.        Judgment: Judgment normal.  LS w/pain R neck - swollen LNs     A total time of 45 minutes was spent preparing to see the patient, reviewing tests, x-rays, operative reports and other medical records.  Also, obtaining history and performing comprehensive physical exam.  Additionally, counseling the patient regarding the above listed issues lung cancer screen, adenopathy.   Finally, documenting clinical information in the health records, coordination of care, educating the patient..  Lab Results  Component Value Date   WBC 8.7 09/06/2021   HGB 13.8 09/06/2021   HCT 39.2 09/06/2021   PLT 229 09/06/2021   GLUCOSE 101 (H) 09/06/2021   CHOL 204 (H) 11/18/2019   TRIG 55.0 11/18/2019   HDL 101.60 11/18/2019   LDLCALC 91 11/18/2019   ALT 18 09/06/2021   AST 27 09/06/2021   NA 136 09/06/2021   K 3.6 09/06/2021   CL 101 09/06/2021   CREATININE 0.72 09/06/2021   BUN 13 09/06/2021   CO2 28 09/06/2021   TSH 4.87 (H) 11/13/2020    No results found.  Assessment & Plan:   Problem List Items Addressed This Visit     Hypothyroidism    Cont on Levothroid      Relevant Medications   oxyCODONE (OXYCONTIN) 40 mg 12 hr tablet   oxyCODONE (OXYCONTIN) 40 mg 12 hr tablet   oxyCODONE  (OXYCONTIN) 80 mg 12 hr tablet   oxyCODONE (OXYCONTIN) 80 mg 12 hr tablet   oxyCODONE (OXYCONTIN) 80 mg 12  hr tablet   LOW BACK PAIN    Chronic Continue with oxycodone/Oxycontin Rx - long term  Potential benefits of a long term opioids use as well as potential risks (i.e. addiction risk, apnea etc) and complications (i.e. Somnolence, constipation and others) were explained to the patient and were aknowledged.      Relevant Medications   oxyCODONE (OXYCONTIN) 40 mg 12 hr tablet   oxyCODONE (OXYCONTIN) 40 mg 12 hr tablet   oxyCODONE (OXYCONTIN) 40 mg 12 hr tablet   oxyCODONE (OXYCONTIN) 80 mg 12 hr tablet   oxyCODONE (OXYCONTIN) 80 mg 12 hr tablet   oxyCODONE (OXYCONTIN) 80 mg 12 hr tablet   Well adult exam   Osteopenia - Primary    On Vit D A lot of exercise      Benign lipomatous neoplasm of skin and subcutaneous tissue of left leg   Relevant Medications   oxyCODONE (OXYCONTIN) 40 mg 12 hr tablet   oxyCODONE (OXYCONTIN) 80 mg 12 hr tablet   oxyCODONE (OXYCONTIN) 80 mg 12 hr tablet   oxyCODONE (OXYCONTIN) 80 mg 12 hr tablet   Chronic pain syndrome    On Rx      Relevant Medications   oxyCODONE (OXYCONTIN) 40 mg 12 hr tablet   oxyCODONE (OXYCONTIN) 40 mg 12 hr tablet   oxyCODONE (OXYCONTIN) 40 mg 12 hr tablet   oxyCODONE (OXYCONTIN) 80 mg 12 hr tablet   oxyCODONE (OXYCONTIN) 80 mg 12 hr tablet   oxyCODONE (OXYCONTIN) 80 mg 12 hr tablet   Coronary atherosclerosis    ASA, Krill oil      Cervical adenopathy    [place] CBC      Relevant Orders   US SOFT TISSUE HEAD & NECK (NON-THYROID)   Other Visit Diagnoses     Screening for lung cancer       Relevant Orders   CT CHEST LUNG CANCER SCREENING LOW DOSE WO CONTRAST         Meds ordered this encounter  Medications  . oxyCODONE (OXYCONTIN) 40 mg 12 hr tablet    Sig: Take 1 tablet (40 mg total) by mouth every 6 (six) hours.    Dispense:  120 tablet    Refill:  0    Please fill on or after 04/09/23  . oxyCODONE  (OXYCONTIN) 40 mg 12 hr tablet    Sig: Take 1 tablet (40 mg total) by mouth every 6 (six) hours.    Dispense:  120 tablet    Refill:  0    Please fill on or after 02/08/23 Dx: M54.5 and G89.4  . oxyCODONE (OXYCONTIN) 40 mg 12 hr tablet    Sig: Take 1 tablet (40 mg total) by mouth every 6 (six) hours.    Dispense:  120 tablet    Refill:  0    Please fill on or after 03/10/23 Dx: M54.5 and G89.4  . oxyCODONE (OXYCONTIN) 80 mg 12 hr tablet    Sig: Take 1 tablet (80 mg total) by mouth every 6 (six) hours.    Dispense:  120 tablet    Refill:  0    Please fill on or after 04/09/23  . oxyCODONE (OXYCONTIN) 80 mg 12 hr tablet    Sig: Take 1 tablet (80 mg total) by mouth every 6 (six) hours.    Dispense:  120 tablet    Refill:  0    Please fill on or after 02/08/23 Dx: M54.5 and G89.4  . oxyCODONE (OXYCONTIN) 80 mg 12 hr  tablet    Sig: Take 1 tablet (80 mg total) by mouth every 6 (six) hours.    Dispense:  120 tablet    Refill:  0    Please fill on or after 03/10/23 Dx: M54.5 and G89.4      Follow-up: Return in about 3 months (around 05/06/2023) for a follow-up visit.  Sonda Primes, MD

## 2023-02-03 NOTE — Assessment & Plan Note (Signed)
ASA, Krill oil 

## 2023-02-03 NOTE — Assessment & Plan Note (Signed)
On Vit D A lot of exercise 

## 2023-02-03 NOTE — Assessment & Plan Note (Signed)
[place] CBC

## 2023-02-03 NOTE — Assessment & Plan Note (Signed)
Chronic Continue with oxycodone/Oxycontin Rx - long term  Potential benefits of a long term opioids use as well as potential risks (i.e. addiction risk, apnea etc) and complications (i.e. Somnolence, constipation and others) were explained to the patient and were aknowledged. 

## 2023-02-04 DIAGNOSIS — M545 Low back pain, unspecified: Secondary | ICD-10-CM | POA: Diagnosis not present

## 2023-02-04 DIAGNOSIS — G8929 Other chronic pain: Secondary | ICD-10-CM | POA: Diagnosis not present

## 2023-02-04 LAB — URINALYSIS
Bilirubin Urine: NEGATIVE
Hgb urine dipstick: NEGATIVE
Leukocytes,Ua: NEGATIVE
Nitrite: NEGATIVE
Specific Gravity, Urine: 1.02 (ref 1.000–1.030)
Total Protein, Urine: NEGATIVE
Urine Glucose: NEGATIVE
Urobilinogen, UA: 0.2 (ref 0.0–1.0)
pH: 6 (ref 5.0–8.0)

## 2023-02-06 ENCOUNTER — Ambulatory Visit
Admission: RE | Admit: 2023-02-06 | Discharge: 2023-02-06 | Disposition: A | Discharge status: Home / Self Care | Payer: Medicare HMO | Source: Ambulatory Visit | Attending: Internal Medicine | Admitting: Internal Medicine

## 2023-02-06 DIAGNOSIS — R590 Localized enlarged lymph nodes: Secondary | ICD-10-CM | POA: Diagnosis not present

## 2023-02-06 DIAGNOSIS — E041 Nontoxic single thyroid nodule: Secondary | ICD-10-CM | POA: Diagnosis not present

## 2023-02-06 LAB — PMP SCREEN PROFILE (10S), URINE
Amphetamine Scrn, Ur: NEGATIVE ng/mL
BARBITURATE SCREEN URINE: NEGATIVE ng/mL
BENZODIAZEPINE SCREEN, URINE: NEGATIVE ng/mL
CANNABINOIDS UR QL SCN: NEGATIVE ng/mL
Cocaine (Metab) Scrn, Ur: NEGATIVE ng/mL
Creatinine(Crt), U: 116.7 mg/dL (ref 20.0–300.0)
Methadone Screen, Urine: NEGATIVE ng/mL
OXYCODONE+OXYMORPHONE UR QL SCN: POSITIVE ng/mL — AB
Opiate Scrn, Ur: POSITIVE ng/mL — AB
Ph of Urine: 5.6 (ref 4.5–8.9)
Phencyclidine Qn, Ur: NEGATIVE ng/mL
Propoxyphene Scrn, Ur: NEGATIVE ng/mL

## 2023-02-10 ENCOUNTER — Other Ambulatory Visit: Payer: Self-pay | Admitting: Internal Medicine

## 2023-02-10 DIAGNOSIS — E041 Nontoxic single thyroid nodule: Secondary | ICD-10-CM

## 2023-02-12 ENCOUNTER — Ambulatory Visit
Admission: RE | Admit: 2023-02-12 | Discharge: 2023-02-12 | Disposition: A | Discharge status: Home / Self Care | Payer: Medicare HMO | Source: Ambulatory Visit | Attending: Internal Medicine | Admitting: Internal Medicine

## 2023-02-12 DIAGNOSIS — E041 Nontoxic single thyroid nodule: Secondary | ICD-10-CM

## 2023-02-13 ENCOUNTER — Telehealth: Payer: Self-pay | Admitting: Internal Medicine

## 2023-02-13 NOTE — Telephone Encounter (Signed)
Notified pt w/ MD response and US thyroid result.Marland KitchenRaechel Chute

## 2023-02-13 NOTE — Telephone Encounter (Signed)
Pt called stating she went to get her test done yesterday on 02/12/23 and they never put in the order for a needle biopsy. Please advise.

## 2023-02-13 NOTE — Telephone Encounter (Signed)
The order is in for afirma first lesion already. I am not sure if the imaging place will do a biopsy without a resulted thyroid ultrasound to ensure the biopsy is needed. That is the appropriate order as afirma is recommended to get best results.

## 2023-02-13 NOTE — Telephone Encounter (Signed)
Called pt she states she went to have the [place] and Biopsy done but they only did her [place]. She still need order for Biopsy. Per chart an order for 16109 (CPT) - PR FINE NEEDLE ASPIRATION BX W/US GDN 1ST but not done...Raechel Chute

## 2023-03-10 ENCOUNTER — Ambulatory Visit
Admission: RE | Admit: 2023-03-10 | Discharge: 2023-03-10 | Disposition: A | Discharge status: Home / Self Care | Payer: Medicare HMO | Source: Ambulatory Visit | Attending: Internal Medicine | Admitting: Internal Medicine

## 2023-03-10 DIAGNOSIS — Z87891 Personal history of nicotine dependence: Secondary | ICD-10-CM | POA: Diagnosis not present

## 2023-03-10 DIAGNOSIS — Z122 Encounter for screening for malignant neoplasm of respiratory organs: Secondary | ICD-10-CM

## 2023-03-16 ENCOUNTER — Other Ambulatory Visit: Payer: Self-pay | Admitting: Internal Medicine

## 2023-03-16 DIAGNOSIS — I2510 Atherosclerotic heart disease of native coronary artery without angina pectoris: Secondary | ICD-10-CM

## 2023-05-06 ENCOUNTER — Encounter: Payer: Self-pay | Admitting: Internal Medicine

## 2023-05-06 ENCOUNTER — Ambulatory Visit (INDEPENDENT_AMBULATORY_CARE_PROVIDER_SITE_OTHER): Payer: Medicare HMO | Admitting: Internal Medicine

## 2023-05-06 VITALS — BP 108/60 | HR 69 | Temp 98.6°F | Ht 65.0 in | Wt 129.0 lb

## 2023-05-06 DIAGNOSIS — E042 Nontoxic multinodular goiter: Secondary | ICD-10-CM

## 2023-05-06 DIAGNOSIS — I2510 Atherosclerotic heart disease of native coronary artery without angina pectoris: Secondary | ICD-10-CM

## 2023-05-06 DIAGNOSIS — D1724 Benign lipomatous neoplasm of skin and subcutaneous tissue of left leg: Secondary | ICD-10-CM | POA: Diagnosis not present

## 2023-05-06 DIAGNOSIS — I2583 Coronary atherosclerosis due to lipid rich plaque: Secondary | ICD-10-CM | POA: Diagnosis not present

## 2023-05-06 DIAGNOSIS — M545 Low back pain, unspecified: Secondary | ICD-10-CM | POA: Diagnosis not present

## 2023-05-06 DIAGNOSIS — E876 Hypokalemia: Secondary | ICD-10-CM

## 2023-05-06 DIAGNOSIS — E034 Atrophy of thyroid (acquired): Secondary | ICD-10-CM

## 2023-05-06 DIAGNOSIS — G8929 Other chronic pain: Secondary | ICD-10-CM

## 2023-05-06 MED ORDER — OXYCODONE HCL ER 80 MG PO T12A
80.0000 mg | EXTENDED_RELEASE_TABLET | Freq: Four times a day (QID) | ORAL | 0 refills | Status: DC
Start: 2023-05-06 — End: 2023-08-05

## 2023-05-06 MED ORDER — OXYCODONE HCL ER 40 MG PO T12A
40.0000 mg | EXTENDED_RELEASE_TABLET | Freq: Four times a day (QID) | ORAL | 0 refills | Status: DC
Start: 2023-05-06 — End: 2023-08-05

## 2023-05-06 MED ORDER — FUROSEMIDE 40 MG PO TABS: 40.0000 mg | ORAL_TABLET | Freq: Every day | ORAL | 1 refills | Status: DC

## 2023-05-06 MED ORDER — OXYCODONE HCL ER 40 MG PO T12A: 40.0000 mg | EXTENDED_RELEASE_TABLET | Freq: Four times a day (QID) | ORAL | 0 refills | Status: DC

## 2023-05-06 MED ORDER — LEVOTHYROXINE SODIUM 100 MCG PO TABS: 100.0000 ug | ORAL_TABLET | Freq: Every day | ORAL | 3 refills | Status: DC

## 2023-05-06 NOTE — Assessment & Plan Note (Signed)
ASA, Krill oil

## 2023-05-06 NOTE — Assessment & Plan Note (Signed)
On Kdur

## 2023-05-06 NOTE — Progress Notes (Signed)
Subjective:  Patient ID: Abigail Wilson, female    DOB: [birth year]  Age: 62 y.o. MRN: [id number]  CC: Follow-up (3 mnth f/u)   HPI BETTYANNE MUNETON presents for chronic pain  Outpatient Medications Prior to Visit  Medication Sig Dispense Refill   aspirin 81 MG EC tablet Take 81 mg by mouth daily.     B Complex-C (SUPER B COMPLEX PO) Take by mouth.     Cholecalciferol 3000 units TABS Take 2 tablets by mouth daily.     COENZYME Q-10 PO Take 200 mg by mouth daily.     docusate sodium (COLACE) 100 MG capsule Take 100 mg by mouth 2 (two) times daily.     fluticasone (FLONASE) 50 MCG/ACT nasal spray shake liquid & use 2 sprays in each nostril daily 16 g 5   naloxone (NARCAN) nasal spray 4 mg/0.1 mL 1 actuation in one nostril once. May repeat in 2-3 min 1 each 2   potassium chloride SA (KLOR-CON M) 20 MEQ tablet Take 1 tablet (20 mEq total) by mouth daily. 90 tablet 3   Red Yeast Rice 600 MG CAPS 1 po qd 100 capsule 3   Sennosides (SENNA LAX PO) Take by mouth.     furosemide (LASIX) 40 MG tablet Take 1 tablet (40 mg total) by mouth daily. 90 tablet 1   levothyroxine (SYNTHROID) 100 MCG tablet TAKE ONE TABLET BY MOUTH DAILY 90 tablet 3   oxyCODONE (OXYCONTIN) 40 mg 12 hr tablet Take 1 tablet (40 mg total) by mouth every 6 (six) hours. 120 tablet 0   oxyCODONE (OXYCONTIN) 40 mg 12 hr tablet Take 1 tablet (40 mg total) by mouth every 6 (six) hours. 120 tablet 0   oxyCODONE (OXYCONTIN) 40 mg 12 hr tablet Take 1 tablet (40 mg total) by mouth every 6 (six) hours. 120 tablet 0   oxyCODONE (OXYCONTIN) 80 mg 12 hr tablet Take 1 tablet (80 mg total) by mouth every 6 (six) hours. 120 tablet 0   oxyCODONE (OXYCONTIN) 80 mg 12 hr tablet Take 1 tablet (80 mg total) by mouth every 6 (six) hours. 120 tablet 0   oxyCODONE (OXYCONTIN) 80 mg 12 hr tablet Take 1 tablet (80 mg total) by mouth every 6 (six) hours. 120 tablet 0   No facility-administered medications prior to visit.    ROS: Review of  Systems  Constitutional:  Negative for activity change, appetite change, chills, diaphoresis, fatigue, fever and unexpected weight change.  HENT:  Negative for congestion, dental problem, ear pain, hearing loss, mouth sores, postnasal drip, sinus pressure, sneezing, sore throat and voice change.   Eyes:  Negative for pain and visual disturbance.  Respiratory:  Negative for cough, chest tightness, wheezing and stridor.   Cardiovascular:  Negative for chest pain, palpitations and leg swelling.  Gastrointestinal:  Negative for abdominal distention, abdominal pain, blood in stool, nausea, rectal pain and vomiting.  Genitourinary:  Negative for decreased urine volume, difficulty urinating, dysuria, frequency, hematuria, menstrual problem, vaginal bleeding, vaginal discharge and vaginal pain.  Musculoskeletal:  Positive for arthralgias, back pain and gait problem. Negative for joint swelling and neck pain.  Skin:  Negative for color change, pallor, rash and wound.  Neurological:  Negative for dizziness, tremors, syncope, speech difficulty, weakness, light-headedness, numbness and headaches.  Hematological:  Negative for adenopathy.  Psychiatric/Behavioral:  Negative for behavioral problems, confusion, decreased concentration, dysphoric mood, hallucinations, sleep disturbance and suicidal ideas. The patient is not nervous/anxious and is not hyperactive.  Objective:  BP 108/60 (BP Location: Left Arm, Patient Position: Sitting, Cuff Size: Large)   Pulse 69   Temp 98.6 F (37 C) (Oral)   Ht 5\' 5"  (1.651 m)   Wt 129 lb (58.5 kg)   SpO2 94%   BMI 21.47 kg/m   BP Readings from Last 3 Encounters:  05/06/23 108/60  02/03/23 112/78  11/05/22 122/68    Wt Readings from Last 3 Encounters:  05/06/23 129 lb (58.5 kg)  02/03/23 127 lb (57.6 kg)  11/25/22 130 lb (59 kg)    Physical Exam Constitutional:      General: She is not in acute distress.    Appearance: Normal appearance. She is  well-developed.  HENT:     Head: Normocephalic.     Right Ear: External ear normal.     Left Ear: External ear normal.     Nose: Nose normal.  Eyes:     General:        Right eye: No discharge.        Left eye: No discharge.     Conjunctiva/sclera: Conjunctivae normal.     Pupils: Pupils are equal, round, and reactive to light.  Neck:     Thyroid: No thyromegaly.     Vascular: No JVD.     Trachea: No tracheal deviation.  Cardiovascular:     Rate and Rhythm: Normal rate and regular rhythm.     Heart sounds: Normal heart sounds.  Pulmonary:     Effort: No respiratory distress.     Breath sounds: No stridor. No wheezing.  Abdominal:     General: Bowel sounds are normal. There is no distension.     Palpations: Abdomen is soft. There is no mass.     Tenderness: There is no abdominal tenderness. There is no guarding or rebound.  Musculoskeletal:        General: No tenderness.     Cervical back: Normal range of motion and neck supple. No rigidity.  Lymphadenopathy:     Cervical: No cervical adenopathy.  Skin:    Findings: No erythema or rash.  Neurological:     Cranial Nerves: No cranial nerve deficit.     Motor: No abnormal muscle tone.     Coordination: Coordination normal.     Deep Tendon Reflexes: Reflexes normal.  Psychiatric:        Behavior: Behavior normal.        Thought Content: Thought content normal.        Judgment: Judgment normal.     Lab Results  Component Value Date   WBC 5.0 02/03/2023   HGB 12.4 02/03/2023   HCT 37.2 02/03/2023   PLT 229.0 02/03/2023   GLUCOSE 92 02/03/2023   CHOL 193 02/03/2023   TRIG 45.0 02/03/2023   HDL 97.20 02/03/2023   LDLCALC 87 02/03/2023   ALT 14 02/03/2023   AST 25 02/03/2023   NA 135 02/03/2023   K 4.0 02/03/2023   CL 100 02/03/2023   CREATININE 0.81 02/03/2023   BUN 17 02/03/2023   CO2 30 02/03/2023   TSH 4.21 02/03/2023    CT CHEST LUNG CANCER SCREENING LOW DOSE WO CONTRAST  Result Date:  03/13/2023 CLINICAL DATA:  62 year old female former smoker (quit in 2015) with 20 pack-year history of smoking. Lung cancer screening examination. EXAM: CT CHEST WITHOUT CONTRAST LOW-DOSE FOR LUNG CANCER SCREENING TECHNIQUE: Multidetector CT imaging of the chest was performed following the standard protocol without IV contrast. RADIATION DOSE REDUCTION: This exam was  performed according to the departmental dose-optimization program which includes automated exposure control, adjustment of the mA and/or kV according to patient size and/or use of iterative reconstruction technique. COMPARISON:  Cardiac CT 10/19/2018. PET-CT 10/27/2005 chest CT 09/19/2005. FINDINGS: Cardiovascular: Heart size is normal. There is no significant pericardial fluid, thickening or pericardial calcification. There is aortic atherosclerosis, as well as atherosclerosis of the great vessels of the mediastinum and the coronary arteries, including calcified atherosclerotic plaque in the left anterior descending coronary artery. Mediastinum/Nodes: No pathologically enlarged mediastinal or hilar lymph nodes. Please note that accurate exclusion of hilar adenopathy is limited on noncontrast CT scans. Esophagus is unremarkable in appearance. Lungs/Pleura: Smoothly marginated nodule in the inferior aspect of the lingula (axial image 220) with a volume derived mean diameter of 13.6 mm, similar to numerous prior examinations dating back to chest CT from 2007, previously not hypermetabolic on prior PET-CT in 2007, presumably a benign lesion such as a hamartoma. No other suspicious appearing pulmonary nodules or masses are noted. No acute consolidative airspace disease. No pleural effusions. Mild diffuse bronchial wall thickening with very mild centrilobular and paraseptal emphysema. Upper Abdomen: No axillary lymphadenopathy. Musculoskeletal: There are no aggressive appearing lytic or blastic lesions noted in the visualized portions of the skeleton.  IMPRESSION: 1. Lung-RADS 2S, benign appearance or behavior. Continue annual screening with low-dose chest CT without contrast in 12 months. 2. The "S" modifier above refers to potentially clinically significant non lung cancer related findings. Specifically, there is aortic atherosclerosis, in addition to left anterior descending coronary artery disease. Please note that although the presence of coronary artery calcium documents the presence of coronary artery disease, the severity of this disease and any potential stenosis cannot be assessed on this non-gated CT examination. Assessment for potential risk factor modification, dietary therapy or pharmacologic therapy may be warranted, if clinically indicated. 3. Mild diffuse bronchial wall thickening with very mild centrilobular and paraseptal emphysema; imaging findings suggestive of underlying COPD. Aortic Atherosclerosis (ICD10-I70.0) and Emphysema (ICD10-J43.9). Electronically Signed   By: Trudie Reed M.D.   On: 03/13/2023 13:11    Assessment & Plan:   Problem List Items Addressed This Visit     GOITER, MULTINODULAR    Pt was scheduled for FNA, but was told "no need" by Radiology We reviewed Neck US and Thyroid US      Relevant Medications   levothyroxine (SYNTHROID) 100 MCG tablet   Hypothyroidism   Relevant Medications   levothyroxine (SYNTHROID) 100 MCG tablet   oxyCODONE (OXYCONTIN) 40 mg 12 hr tablet   oxyCODONE (OXYCONTIN) 40 mg 12 hr tablet   oxyCODONE (OXYCONTIN) 80 mg 12 hr tablet   oxyCODONE (OXYCONTIN) 80 mg 12 hr tablet   oxyCODONE (OXYCONTIN) 80 mg 12 hr tablet   LOW BACK PAIN - Primary    Chronic Continue with oxycodone/Oxycontin Rx - long term  Potential benefits of a long term opioids use as well as potential risks (i.e. addiction risk, apnea etc) and complications (i.e. Somnolence, constipation and others) were explained to the patient and were aknowledged.      Relevant Medications   oxyCODONE (OXYCONTIN) 40 mg  12 hr tablet   oxyCODONE (OXYCONTIN) 40 mg 12 hr tablet   oxyCODONE (OXYCONTIN) 40 mg 12 hr tablet   oxyCODONE (OXYCONTIN) 80 mg 12 hr tablet   oxyCODONE (OXYCONTIN) 80 mg 12 hr tablet   oxyCODONE (OXYCONTIN) 80 mg 12 hr tablet   Benign lipomatous neoplasm of skin and subcutaneous tissue of left leg  Relevant Medications   oxyCODONE (OXYCONTIN) 40 mg 12 hr tablet   oxyCODONE (OXYCONTIN) 80 mg 12 hr tablet   oxyCODONE (OXYCONTIN) 80 mg 12 hr tablet   oxyCODONE (OXYCONTIN) 80 mg 12 hr tablet   Coronary atherosclerosis    ASA, Krill oil      Relevant Medications   furosemide (LASIX) 40 MG tablet   Hypokalemia    On Kdur         Meds ordered this encounter  Medications   oxyCODONE (OXYCONTIN) 40 mg 12 hr tablet    Sig: Take 1 tablet (40 mg total) by mouth every 6 (six) hours.    Dispense:  120 tablet    Refill:  0    Please fill on or after 07/08/23   levothyroxine (SYNTHROID) 100 MCG tablet    Sig: Take 1 tablet (100 mcg total) by mouth daily.    Dispense:  90 tablet    Refill:  3   furosemide (LASIX) 40 MG tablet    Sig: Take 1 tablet (40 mg total) by mouth daily.    Dispense:  90 tablet    Refill:  1   oxyCODONE (OXYCONTIN) 40 mg 12 hr tablet    Sig: Take 1 tablet (40 mg total) by mouth every 6 (six) hours.    Dispense:  120 tablet    Refill:  0    Please fill on or after 05/09/23 Dx: M54.5 and G89.4   oxyCODONE (OXYCONTIN) 40 mg 12 hr tablet    Sig: Take 1 tablet (40 mg total) by mouth every 6 (six) hours.    Dispense:  120 tablet    Refill:  0    Please fill on or after 06/08/23 Dx: M54.5 and G89.4   oxyCODONE (OXYCONTIN) 80 mg 12 hr tablet    Sig: Take 1 tablet (80 mg total) by mouth every 6 (six) hours.    Dispense:  120 tablet    Refill:  0    Please fill on or after 07/08/23   oxyCODONE (OXYCONTIN) 80 mg 12 hr tablet    Sig: Take 1 tablet (80 mg total) by mouth every 6 (six) hours.    Dispense:  120 tablet    Refill:  0    Please fill on or after 05/09/23  Dx: M54.5 and G89.4   oxyCODONE (OXYCONTIN) 80 mg 12 hr tablet    Sig: Take 1 tablet (80 mg total) by mouth every 6 (six) hours.    Dispense:  120 tablet    Refill:  0    Please fill on or after 06/08/23 Dx: M54.5 and G89.4      Follow-up: Return in about 3 months (around 08/06/2023) for a follow-up visit.  Sonda Primes, MD

## 2023-05-06 NOTE — Assessment & Plan Note (Signed)
Chronic Continue with oxycodone/Oxycontin Rx - long term  Potential benefits of a long term opioids use as well as potential risks (i.e. addiction risk, apnea etc) and complications (i.e. Somnolence, constipation and others) were explained to the patient and were aknowledged. 

## 2023-05-06 NOTE — Assessment & Plan Note (Signed)
Pt was scheduled for FNA, but was told "no need" by Radiology We reviewed Neck US and Thyroid US

## 2023-05-20 NOTE — Progress Notes (Signed)
Referring-Abigail Plotnikov MD Reason for referral-coronary calcification  HPI: 62 year old female for evaluation of coronary calcification at request of Jacinta Shoe MD.  Calcium score February 2020 3 which was 71st percentile.  Laboratories May 2024 showed total cholesterol 193, HDL 97 and LDL 87.  Chest CT June 2024 showed aortic atherosclerosis and calcification in the LAD.  Cardiology now asked to evaluate.  Notes she exercises routinely and does not have dyspnea on exertion, orthopnea, PND, pedal edema, exertional chest pain or syncope.  No claudication.  Current Outpatient Medications  Medication Sig Dispense Refill   aspirin 81 MG EC tablet Take 81 mg by mouth daily.     B Complex-C (SUPER B COMPLEX PO) Take by mouth.     Cholecalciferol 3000 units TABS Take 2 tablets by mouth daily.     COENZYME Q-10 PO Take 200 mg by mouth daily.     docusate sodium (COLACE) 100 MG capsule Take 100 mg by mouth 2 (two) times daily.     fluticasone (FLONASE) 50 MCG/ACT nasal spray shake liquid & use 2 sprays in each nostril daily 16 g 5   furosemide (LASIX) 40 MG tablet Take 1 tablet (40 mg total) by mouth daily. 90 tablet 1   levothyroxine (SYNTHROID) 100 MCG tablet Take 1 tablet (100 mcg total) by mouth daily. 90 tablet 3   naloxone (NARCAN) nasal spray 4 mg/0.1 mL 1 actuation in one nostril once. May repeat in 2-3 min 1 each 2   oxyCODONE (OXYCONTIN) 40 mg 12 hr tablet Take 1 tablet (40 mg total) by mouth every 6 (six) hours. 120 tablet 0   oxyCODONE (OXYCONTIN) 80 mg 12 hr tablet Take 1 tablet (80 mg total) by mouth every 6 (six) hours. 120 tablet 0   potassium chloride SA (KLOR-CON M) 20 MEQ tablet Take 1 tablet (20 mEq total) by mouth daily. 90 tablet 3   Sennosides (SENNA LAX PO) Take by mouth.     oxyCODONE (OXYCONTIN) 40 mg 12 hr tablet Take 1 tablet (40 mg total) by mouth every 6 (six) hours. (Patient not taking: Reported on 05/26/2023) 120 tablet 0   oxyCODONE (OXYCONTIN) 40 mg 12  hr tablet Take 1 tablet (40 mg total) by mouth every 6 (six) hours. (Patient not taking: Reported on 05/26/2023) 120 tablet 0   oxyCODONE (OXYCONTIN) 80 mg 12 hr tablet Take 1 tablet (80 mg total) by mouth every 6 (six) hours. (Patient not taking: Reported on 05/26/2023) 120 tablet 0   oxyCODONE (OXYCONTIN) 80 mg 12 hr tablet Take 1 tablet (80 mg total) by mouth every 6 (six) hours. (Patient not taking: Reported on 05/26/2023) 120 tablet 0   Red Yeast Rice 600 MG CAPS 1 po qd (Patient not taking: Reported on 05/26/2023) 100 capsule 3   No current facility-administered medications for this visit.    Allergies  Allergen Reactions   Nsaids     REACTION: GI upset   Trazodone Hcl     REACTION: urinary retention     Past Medical History:  Diagnosis Date   Asthmatic bronchitis    Mild   Cataract    forming   Depression    Hepatitis C    Hyperthyroidism    s/p 131Iodine Rx 2007   Hypothyroidism    LBP (low back pain)    Menopause    OA (osteoarthritis of spine)    Osteoporosis     Past Surgical History:  Procedure Laterality Date   CESAREAN SECTION  COLONOSCOPY     COLONOSCOPY W/ POLYPECTOMY      Social History   Socioeconomic History   Marital status: Married    Spouse name: Not on file   Number of children: 2   Years of education: Not on file   Highest education level: Not on file  Occupational History   Occupation: retired    Associate Professor: UNEMPLOYED  Tobacco Use   Smoking status: Every Day    Current packs/day: 0.25    Average packs/day: 0.3 packs/day for 1.7 years (0.4 ttl pk-yrs)    Types: Cigarettes    Start date: 09/15/2021    Last attempt to quit: 03/31/2013   Smokeless tobacco: Former   Tobacco comments:    Past hx vape   Vaping Use   Vaping status: Some Days  Substance and Sexual Activity   Alcohol use: Not Currently    Comment: OCC    Drug use: No   Sexual activity: Not Currently    Comment: 1st intercourse- 15, partners- 4, married- 29 yrs   Other  Topics Concern   Not on file  Social History Narrative   Not on file   Social Determinants of Health   Financial Resource Strain: Low Risk  (11/25/2022)   Overall Financial Resource Strain (CARDIA)    Difficulty of Paying Living Expenses: Not hard at all  Food Insecurity: No Food Insecurity (11/25/2022)   Hunger Vital Sign    Worried About Running Out of Food in the Last Year: Never true    Ran Out of Food in the Last Year: Never true  Transportation Needs: No Transportation Needs (11/25/2022)   PRAPARE - Administrator, Civil Service (Medical): No    Lack of Transportation (Non-Medical): No  Physical Activity: Insufficiently Active (11/25/2022)   Exercise Vital Sign    Days of Exercise per Week: 2 days    Minutes of Exercise per Session: 60 min  Stress: No Stress Concern Present (11/25/2022)   [employer] of Occupational Health - Occupational Stress Questionnaire    Feeling of Stress : Not at all  Social Connections: Socially Integrated (11/25/2022)   Social Connection and Isolation Panel [NHANES]    Frequency of Communication with Friends and Family: More than three times a week    Frequency of Social Gatherings with Friends and Family: Once a week    Attends Religious Services: More than 4 times per year    Active Member of [employer] or Organizations: Yes    Attends Engineer, structural: More than 4 times per year    Marital Status: Married  Catering manager Violence: Not At Risk (11/25/2022)   Humiliation, Afraid, Rape, and Kick questionnaire    Fear of Current or Ex-Partner: No    Emotionally Abused: No    Physically Abused: No    Sexually Abused: No    Family History  Problem Relation Age of Onset   Colon polyps Father    Heart disease Father    Hyperlipidemia Father    Hypertension Father    Diabetes Father    Cancer Father    Coronary artery disease Father    Hypertension Other    Colon cancer Neg Hx    Pancreatic cancer Neg Hx    Stomach  cancer Neg Hx    Esophageal cancer Neg Hx     ROS: no fevers or chills, productive cough, hemoptysis, dysphasia, odynophagia, melena, hematochezia, dysuria, hematuria, rash, seizure activity, orthopnea, PND, pedal edema, claudication. Remaining systems are  negative.  Physical Exam:   Blood pressure 106/64, pulse 60, height 5\' 5"  (1.651 m), weight 127 lb (57.6 kg), SpO2 98%.  General:  Well developed/well nourished in NAD Skin warm/dry Patient not depressed No peripheral clubbing Back-normal HEENT-normal/normal eyelids Neck supple/normal carotid upstroke bilaterally; no bruits; no JVD; no thyromegaly chest - CTA/ normal expansion CV - RRR/normal S1 and S2; no murmurs, rubs or gallops;  PMI nondisplaced Abdomen -NT/ND, no HSM, no mass, + bowel sounds, no bruit 2+ femoral pulses, no bruits Ext-no edema, chords, 2+ DP Neuro-grossly nonfocal  EKG Interpretation Date/Time:  Tuesday May 26 2023 13:41:19 EDT Ventricular Rate:  60 PR Interval:  170 QRS Duration:  86 QT Interval:  440 QTC Calculation: 440 R Axis:   74  Text Interpretation: Normal sinus rhythm Minimal voltage criteria for LVH, may be normal variant ( Sokolow-Lyon ) No previous ECGs available Confirmed by Olga Millers ([id number]) on 05/26/2023 1:45:40 PM   A/P  1 coronary calcification-patient has no symptoms and she exercises vigorously.  Continue aspirin.  Add statin.  Will begin Crestor 20 mg daily.  Check lipids and liver in 8 weeks.  2 tobacco abuse-patient counseled on discontinuing.  Olga Millers, MD

## 2023-05-26 ENCOUNTER — Encounter: Payer: Self-pay | Admitting: Cardiology

## 2023-05-26 ENCOUNTER — Ambulatory Visit: Payer: Medicare HMO | Attending: Cardiology | Admitting: Cardiology

## 2023-05-26 VITALS — BP 106/64 | HR 60 | Ht 65.0 in | Wt 127.0 lb

## 2023-05-26 DIAGNOSIS — Z720 Tobacco use: Secondary | ICD-10-CM

## 2023-05-26 DIAGNOSIS — I2510 Atherosclerotic heart disease of native coronary artery without angina pectoris: Secondary | ICD-10-CM

## 2023-05-26 DIAGNOSIS — I2583 Coronary atherosclerosis due to lipid rich plaque: Secondary | ICD-10-CM

## 2023-05-26 DIAGNOSIS — Z136 Encounter for screening for cardiovascular disorders: Secondary | ICD-10-CM | POA: Diagnosis not present

## 2023-05-26 MED ORDER — ROSUVASTATIN CALCIUM 20 MG PO TABS
20.0000 mg | ORAL_TABLET | Freq: Every day | ORAL | 3 refills | Status: DC
Start: 2023-05-26 — End: 2024-02-23

## 2023-05-26 NOTE — Addendum Note (Signed)
Addended by: Freddi Starr on: 05/26/2023 02:03 PM   Modules accepted: Orders

## 2023-05-26 NOTE — Patient Instructions (Signed)
Medication Instructions:   STOP RED YEAST RICE  START ROSUVASTATIN 20 MG ONCE DAILY  *If you need a refill on your cardiac medications before your next appointment, please call your pharmacy*   Lab Work:  Your physician recommends that you return for lab work in: 8 [facility]  If you have labs (blood work) drawn today and your tests are completely normal, you will receive your results only by: MyChart Message (if you have MyChart) OR A paper copy in the mail If you have any lab test that is abnormal or we need to change your treatment, we will call you to review the results.   Follow-Up: At [facility], you and your health needs are our priority.  As part of our continuing mission to provide you with exceptional heart care, we have created designated Provider Care Teams.  These Care Teams include your primary Cardiologist (physician) and Advanced Practice Providers (APPs -  Physician Assistants and Nurse Practitioners) who all work together to provide you with the care you need, when you need it.  We recommend signing up for the patient portal called "MyChart".  Sign up information is provided on this After Visit Summary.  MyChart is used to connect with patients for Virtual Visits (Telemedicine).  Patients are able to view lab/test results, encounter notes, upcoming appointments, etc.  Non-urgent messages can be sent to your provider as well.   To learn more about what you can do with MyChart, go to [email or web address].    Your next appointment:   12 month(s)  Provider:   Olga Millers MD

## 2023-06-23 ENCOUNTER — Other Ambulatory Visit: Payer: Self-pay | Admitting: Internal Medicine

## 2023-08-05 ENCOUNTER — Ambulatory Visit: Payer: Medicare HMO | Admitting: Internal Medicine

## 2023-08-05 ENCOUNTER — Encounter: Payer: Self-pay | Admitting: Internal Medicine

## 2023-08-05 VITALS — BP 110/64 | HR 60 | Temp 98.0°F | Ht 65.0 in | Wt 131.4 lb

## 2023-08-05 DIAGNOSIS — G894 Chronic pain syndrome: Secondary | ICD-10-CM | POA: Diagnosis not present

## 2023-08-05 DIAGNOSIS — F329 Major depressive disorder, single episode, unspecified: Secondary | ICD-10-CM

## 2023-08-05 DIAGNOSIS — M5440 Lumbago with sciatica, unspecified side: Secondary | ICD-10-CM

## 2023-08-05 DIAGNOSIS — E034 Atrophy of thyroid (acquired): Secondary | ICD-10-CM

## 2023-08-05 DIAGNOSIS — I2510 Atherosclerotic heart disease of native coronary artery without angina pectoris: Secondary | ICD-10-CM | POA: Diagnosis not present

## 2023-08-05 DIAGNOSIS — D1724 Benign lipomatous neoplasm of skin and subcutaneous tissue of left leg: Secondary | ICD-10-CM | POA: Diagnosis not present

## 2023-08-05 DIAGNOSIS — I2583 Coronary atherosclerosis due to lipid rich plaque: Secondary | ICD-10-CM | POA: Diagnosis not present

## 2023-08-05 DIAGNOSIS — G8929 Other chronic pain: Secondary | ICD-10-CM

## 2023-08-05 DIAGNOSIS — E890 Postprocedural hypothyroidism: Secondary | ICD-10-CM

## 2023-08-05 MED ORDER — OXYCODONE HCL ER 40 MG PO T12A: 40.0000 mg | EXTENDED_RELEASE_TABLET | Freq: Four times a day (QID) | ORAL | 0 refills | Status: DC

## 2023-08-05 MED ORDER — OXYCODONE HCL ER 80 MG PO T12A: 80.0000 mg | EXTENDED_RELEASE_TABLET | Freq: Four times a day (QID) | ORAL | 0 refills | Status: DC

## 2023-08-05 MED ORDER — OXYCODONE HCL ER 40 MG PO T12A
40.0000 mg | EXTENDED_RELEASE_TABLET | Freq: Four times a day (QID) | ORAL | 0 refills | Status: DC
Start: 2023-08-05 — End: 2023-11-06

## 2023-08-05 NOTE — Assessment & Plan Note (Signed)
F/u w/Dr Jens Som On crestor 2024

## 2023-08-05 NOTE — Assessment & Plan Note (Signed)
Chronic Continue with oxycodone/Oxycontin Rx - long term  Potential benefits of a long term opioids use as well as potential risks (i.e. addiction risk, apnea etc) and complications (i.e. Somnolence, constipation and others) were explained to the patient and were aknowledged. 

## 2023-08-05 NOTE — Assessment & Plan Note (Signed)
Doing well 

## 2023-08-05 NOTE — Assessment & Plan Note (Signed)
On Rx 

## 2023-08-05 NOTE — Assessment & Plan Note (Signed)
Cont on Levothroid 

## 2023-08-05 NOTE — Progress Notes (Signed)
Subjective:  Patient ID: Abigail Wilson, female    DOB: May 22, 1961  Age: 62 y.o. MRN: [id number]  CC: Follow-up (3 Month Follow Up)   HPI Abigail Wilson presents for chronic pain, hypothyroidism, dyslipidemia  Outpatient Medications Prior to Visit  Medication Sig Dispense Refill   aspirin 81 MG EC tablet Take 81 mg by mouth daily.     B Complex-C (SUPER B COMPLEX PO) Take by mouth.     Cholecalciferol 3000 units TABS Take 2 tablets by mouth daily.     COENZYME Q-10 PO Take 200 mg by mouth daily.     docusate sodium (COLACE) 100 MG capsule Take 100 mg by mouth 2 (two) times daily.     fluticasone (FLONASE) 50 MCG/ACT nasal spray shake liquid & use 2 sprays in each nostril daily 16 g 5   furosemide (LASIX) 40 MG tablet Take 1 tablet (40 mg total) by mouth daily. 90 tablet 1   levothyroxine (SYNTHROID) 100 MCG tablet Take 1 tablet (100 mcg total) by mouth daily. 90 tablet 3   naloxone (NARCAN) nasal spray 4 mg/0.1 mL 1 actuation in one nostril once. May repeat in 2-3 min 1 each 2   potassium chloride SA (KLOR-CON M) 20 MEQ tablet TAKE ONE TABLET BY MOUTH DAILY 90 tablet 3   rosuvastatin (CRESTOR) 20 MG tablet Take 1 tablet (20 mg total) by mouth daily. 90 tablet 3   Sennosides (SENNA LAX PO) Take by mouth.     oxyCODONE (OXYCONTIN) 40 mg 12 hr tablet Take 1 tablet (40 mg total) by mouth every 6 (six) hours. 120 tablet 0   oxyCODONE (OXYCONTIN) 80 mg 12 hr tablet Take 1 tablet (80 mg total) by mouth every 6 (six) hours. 120 tablet 0   oxyCODONE (OXYCONTIN) 40 mg 12 hr tablet Take 1 tablet (40 mg total) by mouth every 6 (six) hours. (Patient not taking: Reported on 05/26/2023) 120 tablet 0   oxyCODONE (OXYCONTIN) 40 mg 12 hr tablet Take 1 tablet (40 mg total) by mouth every 6 (six) hours. (Patient not taking: Reported on 05/26/2023) 120 tablet 0   oxyCODONE (OXYCONTIN) 80 mg 12 hr tablet Take 1 tablet (80 mg total) by mouth every 6 (six) hours. (Patient not taking: Reported on 05/26/2023)  120 tablet 0   oxyCODONE (OXYCONTIN) 80 mg 12 hr tablet Take 1 tablet (80 mg total) by mouth every 6 (six) hours. (Patient not taking: Reported on 05/26/2023) 120 tablet 0   No facility-administered medications prior to visit.    ROS: Review of Systems  Constitutional:  Negative for activity change, appetite change, chills, fatigue and unexpected weight change.  HENT:  Negative for congestion, mouth sores and sinus pressure.   Eyes:  Negative for visual disturbance.  Respiratory:  Negative for cough and chest tightness.   Gastrointestinal:  Negative for abdominal pain and nausea.  Genitourinary:  Negative for difficulty urinating, frequency and vaginal pain.  Musculoskeletal:  Negative for back pain and gait problem.  Skin:  Negative for pallor and rash.  Neurological:  Negative for dizziness, tremors, weakness, numbness and headaches.  Psychiatric/Behavioral:  Negative for confusion, sleep disturbance and suicidal ideas.     Objective:  BP 110/64   Pulse 60   Temp 98 F (36.7 C) (Oral)   Ht 5\' 5"  (1.651 m)   Wt 131 lb 6.4 oz (59.6 kg)   SpO2 97%   BMI 21.87 kg/m   BP Readings from Last 3 Encounters:  08/05/23 110/64  05/26/23 106/64  05/06/23 108/60    Wt Readings from Last 3 Encounters:  08/05/23 131 lb 6.4 oz (59.6 kg)  05/26/23 127 lb (57.6 kg)  05/06/23 129 lb (58.5 kg)    Physical Exam Constitutional:      General: She is not in acute distress.    Appearance: She is well-developed. She is obese.  HENT:     Head: Normocephalic.     Right Ear: External ear normal.     Left Ear: External ear normal.     Nose: Nose normal.  Eyes:     General:        Right eye: No discharge.        Left eye: No discharge.     Conjunctiva/sclera: Conjunctivae normal.     Pupils: Pupils are equal, round, and reactive to light.  Neck:     Thyroid: No thyromegaly.     Vascular: No JVD.     Trachea: No tracheal deviation.  Cardiovascular:     Rate and Rhythm: Normal rate and  regular rhythm.     Heart sounds: Normal heart sounds.  Pulmonary:     Effort: No respiratory distress.     Breath sounds: No stridor. No wheezing.  Abdominal:     General: Bowel sounds are normal. There is no distension.     Palpations: Abdomen is soft. There is no mass.     Tenderness: There is no abdominal tenderness. There is no guarding or rebound.  Musculoskeletal:        General: No tenderness.     Cervical back: Normal range of motion and neck supple. No rigidity.  Lymphadenopathy:     Cervical: No cervical adenopathy.  Skin:    Findings: No erythema or rash.  Neurological:     Mental Status: She is oriented to person, place, and time.     Cranial Nerves: No cranial nerve deficit.     Motor: No abnormal muscle tone.     Coordination: Coordination normal.     Deep Tendon Reflexes: Reflexes normal.  Psychiatric:        Behavior: Behavior normal.        Thought Content: Thought content normal.        Judgment: Judgment normal.     Lab Results  Component Value Date   WBC 5.0 02/03/2023   HGB 12.4 02/03/2023   HCT 37.2 02/03/2023   PLT 229.0 02/03/2023   GLUCOSE 92 02/03/2023   CHOL 193 02/03/2023   TRIG 45.0 02/03/2023   HDL 97.20 02/03/2023   LDLCALC 87 02/03/2023   ALT 14 02/03/2023   AST 25 02/03/2023   NA 135 02/03/2023   K 4.0 02/03/2023   CL 100 02/03/2023   CREATININE 0.81 02/03/2023   BUN 17 02/03/2023   CO2 30 02/03/2023   TSH 4.21 02/03/2023    CT CHEST LUNG CANCER SCREENING LOW DOSE WO CONTRAST  Result Date: 03/13/2023 CLINICAL DATA:  62 year old female former smoker (quit in 2015) with 20 pack-year history of smoking. Lung cancer screening examination. EXAM: CT CHEST WITHOUT CONTRAST LOW-DOSE FOR LUNG CANCER SCREENING TECHNIQUE: Multidetector CT imaging of the chest was performed following the standard protocol without IV contrast. RADIATION DOSE REDUCTION: This exam was performed according to the departmental dose-optimization program which  includes automated exposure control, adjustment of the mA and/or kV according to patient size and/or use of iterative reconstruction technique. COMPARISON:  Cardiac CT 10/19/2018. PET-CT 10/27/2005 chest CT 09/19/2005. FINDINGS: Cardiovascular: Heart size is normal.  There is no significant pericardial fluid, thickening or pericardial calcification. There is aortic atherosclerosis, as well as atherosclerosis of the great vessels of the mediastinum and the coronary arteries, including calcified atherosclerotic plaque in the left anterior descending coronary artery. Mediastinum/Nodes: No pathologically enlarged mediastinal or hilar lymph nodes. Please note that accurate exclusion of hilar adenopathy is limited on noncontrast CT scans. Esophagus is unremarkable in appearance. Lungs/Pleura: Smoothly marginated nodule in the inferior aspect of the lingula (axial image 220) with a volume derived mean diameter of 13.6 mm, similar to numerous prior examinations dating back to chest CT from 2007, previously not hypermetabolic on prior PET-CT in 2007, presumably a benign lesion such as a hamartoma. No other suspicious appearing pulmonary nodules or masses are noted. No acute consolidative airspace disease. No pleural effusions. Mild diffuse bronchial wall thickening with very mild centrilobular and paraseptal emphysema. Upper Abdomen: No axillary lymphadenopathy. Musculoskeletal: There are no aggressive appearing lytic or blastic lesions noted in the visualized portions of the skeleton. IMPRESSION: 1. Lung-RADS 2S, benign appearance or behavior. Continue annual screening with low-dose chest CT without contrast in 12 months. 2. The "S" modifier above refers to potentially clinically significant non lung cancer related findings. Specifically, there is aortic atherosclerosis, in addition to left anterior descending coronary artery disease. Please note that although the presence of coronary artery calcium documents the presence of  coronary artery disease, the severity of this disease and any potential stenosis cannot be assessed on this non-gated CT examination. Assessment for potential risk factor modification, dietary therapy or pharmacologic therapy may be warranted, if clinically indicated. 3. Mild diffuse bronchial wall thickening with very mild centrilobular and paraseptal emphysema; imaging findings suggestive of underlying COPD. Aortic Atherosclerosis (ICD10-I70.0) and Emphysema (ICD10-J43.9). Electronically Signed   By: Trudie Reed M.D.   On: 03/13/2023 13:11    Assessment & Plan:   Problem List Items Addressed This Visit     Hypothyroidism - Primary    Cont on Levothroid      Relevant Medications   oxyCODONE (OXYCONTIN) 40 mg 12 hr tablet   oxyCODONE (OXYCONTIN) 40 mg 12 hr tablet   oxyCODONE (OXYCONTIN) 80 mg 12 hr tablet   oxyCODONE (OXYCONTIN) 80 mg 12 hr tablet   oxyCODONE (OXYCONTIN) 80 mg 12 hr tablet   Depression    Doing well      LOW BACK PAIN    Chronic Continue with oxycodone/Oxycontin Rx - long term  Potential benefits of a long term opioids use as well as potential risks (i.e. addiction risk, apnea etc) and complications (i.e. Somnolence, constipation and others) were explained to the patient and were aknowledged.      Relevant Medications   oxyCODONE (OXYCONTIN) 40 mg 12 hr tablet   oxyCODONE (OXYCONTIN) 40 mg 12 hr tablet   oxyCODONE (OXYCONTIN) 40 mg 12 hr tablet   oxyCODONE (OXYCONTIN) 80 mg 12 hr tablet   oxyCODONE (OXYCONTIN) 80 mg 12 hr tablet   oxyCODONE (OXYCONTIN) 80 mg 12 hr tablet   Benign lipomatous neoplasm of skin and subcutaneous tissue of left leg   Relevant Medications   oxyCODONE (OXYCONTIN) 40 mg 12 hr tablet   oxyCODONE (OXYCONTIN) 80 mg 12 hr tablet   oxyCODONE (OXYCONTIN) 80 mg 12 hr tablet   oxyCODONE (OXYCONTIN) 80 mg 12 hr tablet   Chronic pain syndrome    On Rx      Relevant Medications   oxyCODONE (OXYCONTIN) 40 mg 12 hr tablet   oxyCODONE  (OXYCONTIN) 40 mg 12  hr tablet   oxyCODONE (OXYCONTIN) 40 mg 12 hr tablet   oxyCODONE (OXYCONTIN) 80 mg 12 hr tablet   oxyCODONE (OXYCONTIN) 80 mg 12 hr tablet   oxyCODONE (OXYCONTIN) 80 mg 12 hr tablet   Coronary atherosclerosis    F/u w/Dr Jens Som On crestor 2024         Meds ordered this encounter  Medications   oxyCODONE (OXYCONTIN) 40 mg 12 hr tablet    Sig: Take 1 tablet (40 mg total) by mouth every 6 (six) hours.    Dispense:  120 tablet    Refill:  0    Please fill on or after 08/08/23   oxyCODONE (OXYCONTIN) 40 mg 12 hr tablet    Sig: Take 1 tablet (40 mg total) by mouth every 6 (six) hours.    Dispense:  120 tablet    Refill:  0    Please fill on or after 09/07/23 Dx: M54.5 and G89.4   oxyCODONE (OXYCONTIN) 40 mg 12 hr tablet    Sig: Take 1 tablet (40 mg total) by mouth every 6 (six) hours.    Dispense:  120 tablet    Refill:  0    Please fill on or after 10/07/23 Dx: M54.5 and G89.4   oxyCODONE (OXYCONTIN) 80 mg 12 hr tablet    Sig: Take 1 tablet (80 mg total) by mouth every 6 (six) hours.    Dispense:  120 tablet    Refill:  0    Please fill on or after 08/08/23   oxyCODONE (OXYCONTIN) 80 mg 12 hr tablet    Sig: Take 1 tablet (80 mg total) by mouth every 6 (six) hours.    Dispense:  120 tablet    Refill:  0    Please fill on or after 09/07/23 Dx: M54.5 and G89.4   oxyCODONE (OXYCONTIN) 80 mg 12 hr tablet    Sig: Take 1 tablet (80 mg total) by mouth every 6 (six) hours.    Dispense:  120 tablet    Refill:  0    Please fill on or after 10/07/23 Dx: M54.5 and G89.4      Follow-up: Return in about 3 months (around 11/05/2023) for a follow-up visit.  Sonda Primes, MD

## 2023-08-06 ENCOUNTER — Encounter: Payer: Self-pay | Admitting: *Deleted

## 2023-08-06 ENCOUNTER — Encounter: Payer: Self-pay | Admitting: Internal Medicine

## 2023-08-06 LAB — HEPATIC FUNCTION PANEL
ALT: 19 [IU]/L (ref 0–32)
AST: 31 [IU]/L (ref 0–40)
Albumin: 4.7 g/dL (ref 3.9–4.9)
Alkaline Phosphatase: 63 [IU]/L (ref 44–121)
Bilirubin Total: 0.3 mg/dL (ref 0.0–1.2)
Bilirubin, Direct: 0.12 mg/dL (ref 0.00–0.40)
Total Protein: 7 g/dL (ref 6.0–8.5)

## 2023-08-06 LAB — LIPID PANEL
Chol/HDL Ratio: 1.7 {ratio} (ref 0.0–4.4)
Cholesterol, Total: 184 mg/dL (ref 100–199)
HDL: 107 mg/dL (ref >39)
LDL Chol Calc (NIH): 67 mg/dL (ref 0–99)
Triglycerides: 52 mg/dL (ref 0–149)
VLDL Cholesterol Cal: 10 mg/dL (ref 5–40)

## 2023-10-07 ENCOUNTER — Telehealth: Payer: Self-pay | Admitting: Internal Medicine

## 2023-10-07 DIAGNOSIS — G8929 Other chronic pain: Secondary | ICD-10-CM

## 2023-10-07 DIAGNOSIS — E034 Atrophy of thyroid (acquired): Secondary | ICD-10-CM

## 2023-10-07 DIAGNOSIS — D1724 Benign lipomatous neoplasm of skin and subcutaneous tissue of left leg: Secondary | ICD-10-CM

## 2023-10-07 NOTE — Telephone Encounter (Signed)
Copied from CRM [id number]. Topic: Clinical - Medication Refill >> Oct 07, 2023 10:02 AM Thomes Dinning wrote: Most Recent Primary Care Visit:  Provider: Tresa Garter  Department: [facility]  Visit Type: OFFICE VISIT  Date: 08/05/2023  Medication: oxyCODONE (OXYCONTIN) 80 mg 12  Has the patient contacted their pharmacy? Yes (Agent: If no, request that the patient contact the pharmacy for the refill. If patient does not wish to contact the pharmacy document the reason why and proceed with request.) (Agent: If yes, when and what did the pharmacy advise?)  Is this the correct pharmacy for this prescription? Yes If no, delete pharmacy and type the correct one.  This is the patient's preferred pharmacy:  [facility] [place], [place] - [address] [place] [address] Phone: [phone] Fax: [phone]   Has the prescription been filled recently? Yes  Is the patient out of the medication? Yes  Has the patient been seen for an appointment in the last year OR does the patient have an upcoming appointment? Yes  Can we respond through MyChart? Yes  Agent: Please be advised that Rx refills may take up to 3 business days. We ask that you follow-up with your pharmacy.

## 2023-10-11 MED ORDER — OXYCODONE HCL ER 80 MG PO T12A: 80.0000 mg | EXTENDED_RELEASE_TABLET | Freq: Four times a day (QID) | ORAL | 0 refills | Status: DC

## 2023-10-13 ENCOUNTER — Telehealth: Payer: Self-pay

## 2023-10-13 ENCOUNTER — Other Ambulatory Visit (HOSPITAL_COMMUNITY): Payer: Self-pay

## 2023-10-13 NOTE — Telephone Encounter (Signed)
Copied from CRM [id number]. Topic: Clinical - Prescription Issue >> Oct 13, 2023  2:44 PM Abigail Wilson wrote: Reason for CRM: Patient calling to check the status of the prior authorization for  oxyCODONE (OXYCONTIN) 80 mg 12.  ---- Please start PA for pt.

## 2023-10-13 NOTE — Telephone Encounter (Signed)
Pharmacy Patient Advocate Encounter  Received notification from [facility] that Prior Authorization for Oxycontin 80mg  ER  has been APPROVED from 09/16/23 to 09/14/24   PA #/Case ID/Reference #: [id number]

## 2023-10-13 NOTE — Telephone Encounter (Signed)
Pharmacy Patient Advocate Encounter   Received notification from Pt Calls Messages that prior authorization for Oxycontin 80mg  ER is required/requested.   Insurance verification completed.   The patient is insured through [facility] .   Per test claim: PA required; PA submitted to above mentioned insurance via CoverMyMeds Key/confirmation #/EOC [id number] Status is pending

## 2023-10-14 ENCOUNTER — Ambulatory Visit: Payer: Medicare HMO | Admitting: Family Medicine

## 2023-10-15 ENCOUNTER — Encounter: Payer: Self-pay | Admitting: Nurse Practitioner

## 2023-10-15 ENCOUNTER — Ambulatory Visit: Payer: Medicare HMO | Admitting: Nurse Practitioner

## 2023-10-15 VITALS — BP 100/64 | HR 69 | Temp 97.8°F | Ht 65.0 in | Wt 133.5 lb

## 2023-10-15 DIAGNOSIS — R1032 Left lower quadrant pain: Secondary | ICD-10-CM

## 2023-10-15 DIAGNOSIS — R109 Unspecified abdominal pain: Secondary | ICD-10-CM

## 2023-10-15 LAB — COMPREHENSIVE METABOLIC PANEL
ALT: 19 U/L (ref 0–35)
AST: 28 U/L (ref 0–37)
Albumin: 4.7 g/dL (ref 3.5–5.2)
Alkaline Phosphatase: 51 U/L (ref 39–117)
BUN: 19 mg/dL (ref 6–23)
CO2: 30 meq/L (ref 19–32)
Calcium: 9.7 mg/dL (ref 8.4–10.5)
Chloride: 99 meq/L (ref 96–112)
Creatinine, Ser: 0.69 mg/dL (ref 0.40–1.20)
GFR: 92.05 mL/min (ref >60.00)
Glucose, Bld: 88 mg/dL (ref 70–99)
Potassium: 4.1 meq/L (ref 3.5–5.1)
Sodium: 137 meq/L (ref 135–145)
Total Bilirubin: 0.4 mg/dL (ref 0.2–1.2)
Total Protein: 7.3 g/dL (ref 6.0–8.3)

## 2023-10-15 LAB — CBC WITH DIFFERENTIAL/PLATELET
Basophils Absolute: 0 10*3/uL (ref 0.0–0.1)
Basophils Relative: 0.6 % (ref 0.0–3.0)
Eosinophils Absolute: 0.2 10*3/uL (ref 0.0–0.7)
Eosinophils Relative: 3.3 % (ref 0.0–5.0)
HCT: 39.7 % (ref 36.0–46.0)
Hemoglobin: 13.2 g/dL (ref 12.0–15.0)
Lymphocytes Relative: 39.5 % (ref 12.0–46.0)
Lymphs Abs: 2.1 10*3/uL (ref 0.7–4.0)
MCHC: 33.2 g/dL (ref 30.0–36.0)
MCV: 102.2 fL — ABNORMAL HIGH (ref 78.0–100.0)
Monocytes Absolute: 0.6 10*3/uL (ref 0.1–1.0)
Monocytes Relative: 11.2 % (ref 3.0–12.0)
Neutro Abs: 2.5 10*3/uL (ref 1.4–7.7)
Neutrophils Relative %: 45.4 % (ref 43.0–77.0)
Platelets: 240 10*3/uL (ref 150.0–400.0)
RBC: 3.89 Mil/uL (ref 3.87–5.11)
RDW: 15.2 % (ref 11.5–15.5)
WBC: 5.4 10*3/uL (ref 4.0–10.5)

## 2023-10-15 LAB — URINALYSIS, ROUTINE W REFLEX MICROSCOPIC
Bilirubin Urine: NEGATIVE
Hgb urine dipstick: NEGATIVE
Ketones, ur: NEGATIVE
Leukocytes,Ua: NEGATIVE
Nitrite: NEGATIVE
RBC / HPF: NONE SEEN (ref 0–?)
Specific Gravity, Urine: 1.01 (ref 1.000–1.030)
Total Protein, Urine: NEGATIVE
Urine Glucose: NEGATIVE
Urobilinogen, UA: 0.2 (ref 0.0–1.0)
WBC, UA: NONE SEEN (ref 0–?)
pH: 6.5 (ref 5.0–8.0)

## 2023-10-15 NOTE — Assessment & Plan Note (Signed)
Subacute Intermittent Waxes and wanes in intensity Etiology unclear, high on my differential diagnosis list includes kidney stone, diverticulitis, urinary tract infection, ovarian torsion. Will order labs today including CBC and CMP, will also order multiple imaging tests for further evaluation.  Further recommendations may be made based upon these results. While waiting for workup to be completed would recommend she treat with as needed ibuprofen.  She was told if she experiences fever, severe pain that is unrelenting, sensory changes in legs, weakness in legs, blood in urine, or blood in stool she should call our office and/or proceed to the emergency department.  She reports her understanding.

## 2023-10-15 NOTE — Progress Notes (Signed)
Established Patient Office Visit  Subjective   Patient ID: MCKINZEE SPIRITO, female    DOB: 03/04/1961  Age: 63 y.o. MRN: [id number]  Chief Complaint  Patient presents with   Back Pain    Patient has today for acute visit for the above.  She reports that for almost 2 months has been having intermittent low back pain that radiates around to her left lower abdomen/groin area.  When it occurs it is constant and can last 3 to 4 days at a time but then will resolve.  She describes it as a dull pain and can be anywhere from 2/10 - 9/10 in intensity.  She has chronic oxycodone that she takes for chronic back pain but reports that this back pain is new and does not feel similar to her previous back pain.  The oxycodone does not seem to treat this new pain, but she will take ibuprofen as needed which does help ease the discomfort.  She denies sensory changes, weakness to lower extremities, changes in bowel habits, changes in urinary habits, denies hematuria, denies blood in stool, denies dysuria, denies history of kidney stones, no recent fever, no recent chills, no nausea, no vomiting, no history of hysterectomy or oophorectomy.    Review of Systems  Constitutional:  Negative for chills and fever.  Gastrointestinal:  Negative for nausea and vomiting.  Genitourinary:  Negative for dysuria and hematuria.      Objective:     BP 100/64   Pulse 69   Temp 97.8 F (36.6 C) (Temporal)   Ht 5\' 5"  (1.651 m)   Wt 133 lb 8 oz (60.6 kg)   SpO2 95%   BMI 22.22 kg/m    Physical Exam Vitals reviewed.  Constitutional:      General: She is not in acute distress.    Appearance: Normal appearance.  HENT:     Head: Normocephalic and atraumatic.  Neck:     Vascular: No carotid bruit.  Cardiovascular:     Rate and Rhythm: Normal rate and regular rhythm.     Pulses: Normal pulses.     Heart sounds: Normal heart sounds.  Pulmonary:     Effort: Pulmonary effort is normal.     Breath sounds:  Normal breath sounds.  Abdominal:     General: Abdomen is flat. Bowel sounds are normal.     Palpations: Abdomen is soft.     Tenderness: There is no abdominal tenderness. There is no right CVA tenderness, left CVA tenderness or guarding.  Musculoskeletal:     Lumbar back: Normal. Negative right straight leg raise test and negative left straight leg raise test.  Skin:    General: Skin is warm and dry.  Neurological:     General: No focal deficit present.     Mental Status: She is alert and oriented to person, place, and time.     Sensory: Sensation is intact.     Motor: Motor function is intact.     Gait: Gait is intact.  Psychiatric:        Mood and Affect: Mood normal.        Behavior: Behavior normal.        Judgment: Judgment normal.      No results found for any visits on 10/15/23.    The ASCVD Risk score (Arnett DK, et al., 2019) failed to calculate for the following reasons:   The valid HDL cholesterol range is 20 to 100 mg/dL    Assessment &  Plan:   Problem List Items Addressed This Visit       Other   Abdominal pain - Primary   Subacute Intermittent Waxes and wanes in intensity Etiology unclear, high on my differential diagnosis list includes kidney stone, diverticulitis, urinary tract infection, ovarian torsion. Will order labs today including CBC and CMP, will also order multiple imaging tests for further evaluation.  Further recommendations may be made based upon these results. While waiting for workup to be completed would recommend she treat with as needed ibuprofen.  She was told if she experiences fever, severe pain that is unrelenting, sensory changes in legs, weakness in legs, blood in urine, or blood in stool she should call our office and/or proceed to the emergency department.  She reports her understanding.       Relevant Orders   Urinalysis, Routine w reflex microscopic   Urine Culture   CT RENAL STONE STUDY   US PELVIC COMPLETE WITH  TRANSVAGINAL   CT ABDOMEN PELVIS W WO CONTRAST   CBC with Differential/Platelet   Comprehensive metabolic panel   Urinalysis, Routine w reflex microscopic   Urine Culture   CT RENAL STONE STUDY   US PELVIC COMPLETE WITH TRANSVAGINAL   CT ABDOMEN PELVIS W WO CONTRAST   CBC with Differential/Platelet   Comprehensive metabolic panel    Return for As scheduled.    Elenore Paddy, NP

## 2023-10-16 ENCOUNTER — Other Ambulatory Visit: Payer: Medicare HMO

## 2023-10-16 ENCOUNTER — Other Ambulatory Visit: Payer: Self-pay | Admitting: Nurse Practitioner

## 2023-10-16 ENCOUNTER — Telehealth: Payer: Self-pay | Admitting: Nurse Practitioner

## 2023-10-16 LAB — URINE CULTURE: Result:: NO GROWTH

## 2023-10-16 NOTE — Telephone Encounter (Signed)
 Noted! Thank you

## 2023-10-16 NOTE — Telephone Encounter (Signed)
Made pt aware of provider note, pt showed understanding.

## 2023-10-16 NOTE — Telephone Encounter (Signed)
Please call patient and let her know that our prior Auth team reach out to [place] and stated that her insurance will not pay for both CT scans.  For this reason I feel doing the CT of abdomen and pelvis is the preferred imaging test.  Thus, we are going to cancel the renal stone study that is scheduled for today, and our team is working on seeing if they can do the CT of abdomen and pelvis today.  They should reach out to the patient to let her know either way, but for now she should plan on not going to [facility] imaging until she hears back from [place] regarding scheduling the CT of abdomen and pelvis.

## 2023-10-20 ENCOUNTER — Other Ambulatory Visit: Payer: Medicare HMO

## 2023-10-21 ENCOUNTER — Other Ambulatory Visit: Payer: Medicare HMO

## 2023-11-02 ENCOUNTER — Ambulatory Visit
Admission: RE | Admit: 2023-11-02 | Discharge: 2023-11-02 | Disposition: A | Discharge status: Home / Self Care | Payer: Medicare HMO | Source: Ambulatory Visit | Attending: Nurse Practitioner | Admitting: Nurse Practitioner

## 2023-11-02 DIAGNOSIS — R1032 Left lower quadrant pain: Secondary | ICD-10-CM

## 2023-11-02 DIAGNOSIS — I700 Atherosclerosis of aorta: Secondary | ICD-10-CM | POA: Diagnosis not present

## 2023-11-02 DIAGNOSIS — R109 Unspecified abdominal pain: Secondary | ICD-10-CM

## 2023-11-02 MED ORDER — IOPAMIDOL (ISOVUE-300) INJECTION 61%
100.0000 mL | Freq: Once | INTRAVENOUS | Status: AC | PRN
  Administered 2023-11-02: 100 mL via INTRAVENOUS

## 2023-11-03 ENCOUNTER — Other Ambulatory Visit: Payer: Self-pay | Admitting: Nurse Practitioner

## 2023-11-03 DIAGNOSIS — K839 Disease of biliary tract, unspecified: Secondary | ICD-10-CM

## 2023-11-03 DIAGNOSIS — R109 Unspecified abdominal pain: Secondary | ICD-10-CM

## 2023-11-06 ENCOUNTER — Encounter: Payer: Self-pay | Admitting: Internal Medicine

## 2023-11-06 ENCOUNTER — Ambulatory Visit: Payer: Medicare HMO | Admitting: Internal Medicine

## 2023-11-06 VITALS — BP 104/68 | HR 78 | Temp 97.9°F | Ht 65.0 in | Wt 141.0 lb

## 2023-11-06 DIAGNOSIS — Z7985 Long-term (current) use of injectable non-insulin antidiabetic drugs: Secondary | ICD-10-CM | POA: Diagnosis not present

## 2023-11-06 DIAGNOSIS — D1724 Benign lipomatous neoplasm of skin and subcutaneous tissue of left leg: Secondary | ICD-10-CM

## 2023-11-06 DIAGNOSIS — R1032 Left lower quadrant pain: Secondary | ICD-10-CM

## 2023-11-06 DIAGNOSIS — M79604 Pain in right leg: Secondary | ICD-10-CM | POA: Diagnosis not present

## 2023-11-06 DIAGNOSIS — E785 Hyperlipidemia, unspecified: Secondary | ICD-10-CM | POA: Diagnosis not present

## 2023-11-06 DIAGNOSIS — I2510 Atherosclerotic heart disease of native coronary artery without angina pectoris: Secondary | ICD-10-CM | POA: Diagnosis not present

## 2023-11-06 DIAGNOSIS — I2583 Coronary atherosclerosis due to lipid rich plaque: Secondary | ICD-10-CM | POA: Diagnosis not present

## 2023-11-06 DIAGNOSIS — M79605 Pain in left leg: Secondary | ICD-10-CM | POA: Diagnosis not present

## 2023-11-06 DIAGNOSIS — G8929 Other chronic pain: Secondary | ICD-10-CM

## 2023-11-06 DIAGNOSIS — E66812 Obesity, class 2: Secondary | ICD-10-CM | POA: Diagnosis not present

## 2023-11-06 DIAGNOSIS — K838 Other specified diseases of biliary tract: Secondary | ICD-10-CM | POA: Diagnosis not present

## 2023-11-06 DIAGNOSIS — Z6837 Body mass index (BMI) 37.0-37.9, adult: Secondary | ICD-10-CM | POA: Diagnosis not present

## 2023-11-06 DIAGNOSIS — E291 Testicular hypofunction: Secondary | ICD-10-CM | POA: Diagnosis not present

## 2023-11-06 DIAGNOSIS — E1165 Type 2 diabetes mellitus with hyperglycemia: Secondary | ICD-10-CM | POA: Diagnosis not present

## 2023-11-06 DIAGNOSIS — E034 Atrophy of thyroid (acquired): Secondary | ICD-10-CM | POA: Diagnosis not present

## 2023-11-06 DIAGNOSIS — M545 Low back pain, unspecified: Secondary | ICD-10-CM | POA: Diagnosis not present

## 2023-11-06 MED ORDER — OXYCODONE HCL ER 80 MG PO T12A: 80.0000 mg | EXTENDED_RELEASE_TABLET | Freq: Four times a day (QID) | ORAL | 0 refills | Status: DC

## 2023-11-06 MED ORDER — OXYCODONE HCL ER 40 MG PO T12A: 40.0000 mg | EXTENDED_RELEASE_TABLET | Freq: Four times a day (QID) | ORAL | 0 refills | Status: DC

## 2023-11-06 MED ORDER — HYOSCYAMINE SULFATE 0.125 MG PO TABS: 0.1250 mg | ORAL_TABLET | Freq: Four times a day (QID) | ORAL | 1 refills | Status: AC | PRN

## 2023-11-06 MED ORDER — OXYCODONE HCL ER 40 MG PO T12A
40.0000 mg | EXTENDED_RELEASE_TABLET | Freq: Four times a day (QID) | ORAL | 0 refills | Status: DC
Start: 2023-11-06 — End: 2024-02-04

## 2023-11-06 NOTE — Assessment & Plan Note (Signed)
On CT: 13 mm - ?opioid use related vs other No abn LFTs, no RUQ pain Will get MRCP GI appt Dr Fredric Mare - April 2025

## 2023-11-06 NOTE — Assessment & Plan Note (Signed)
Chronic Continue with oxycodone/Oxycontin Rx - long term  Potential benefits of a long term opioids use as well as potential risks (i.e. addiction risk, apnea etc) and complications (i.e. Somnolence, constipation and others) were explained to the patient and were aknowledged. 

## 2023-11-06 NOTE — Assessment & Plan Note (Addendum)
?  etioloy Abd CT did not show any abn findings in the LLQ Hyoscyamine prn

## 2023-11-06 NOTE — Progress Notes (Signed)
Subjective:  Patient ID: Abigail Wilson, female    DOB: 25-Jan-1961  Age: 63 y.o. MRN: [id number]  CC: Medical Management of Chronic Issues (3 mnth f/u)   HPI Abigail Wilson presents for LLQ abd pain, LBP - worse F/u on abn CT: dilated CBD of 13 mm  Outpatient Medications Prior to Visit  Medication Sig Dispense Refill   aspirin 81 MG EC tablet Take 81 mg by mouth daily.     B Complex-C (SUPER B COMPLEX PO) Take by mouth.     Cholecalciferol 3000 units TABS Take 2 tablets by mouth daily.     COENZYME Q-10 PO Take 200 mg by mouth daily.     docusate sodium (COLACE) 100 MG capsule Take 100 mg by mouth 2 (two) times daily.     fluticasone (FLONASE) 50 MCG/ACT nasal spray shake liquid & use 2 sprays in each nostril daily 16 g 5   furosemide (LASIX) 40 MG tablet Take 1 tablet (40 mg total) by mouth daily. 90 tablet 1   levothyroxine (SYNTHROID) 100 MCG tablet Take 1 tablet (100 mcg total) by mouth daily. 90 tablet 3   naloxone (NARCAN) nasal spray 4 mg/0.1 mL 1 actuation in one nostril once. May repeat in 2-3 min 1 each 2   potassium chloride SA (KLOR-CON M) 20 MEQ tablet TAKE ONE TABLET BY MOUTH DAILY 90 tablet 3   rosuvastatin (CRESTOR) 20 MG tablet Take 1 tablet (20 mg total) by mouth daily. 90 tablet 3   Sennosides (SENNA LAX PO) Take by mouth.     oxyCODONE (OXYCONTIN) 40 mg 12 hr tablet Take 1 tablet (40 mg total) by mouth every 6 (six) hours. 120 tablet 0   oxyCODONE (OXYCONTIN) 40 mg 12 hr tablet Take 1 tablet (40 mg total) by mouth every 6 (six) hours. 120 tablet 0   oxyCODONE (OXYCONTIN) 40 mg 12 hr tablet Take 1 tablet (40 mg total) by mouth every 6 (six) hours. 120 tablet 0   oxyCODONE (OXYCONTIN) 80 mg 12 hr tablet Take 1 tablet (80 mg total) by mouth every 6 (six) hours. 120 tablet 0   oxyCODONE (OXYCONTIN) 80 mg 12 hr tablet Take 1 tablet (80 mg total) by mouth every 6 (six) hours. 120 tablet 0   oxyCODONE (OXYCONTIN) 80 mg 12 hr tablet Take 1 tablet (80 mg total) by  mouth every 6 (six) hours. 120 tablet 0   No facility-administered medications prior to visit.    ROS: Review of Systems  Constitutional:  Negative for activity change, appetite change, chills, fatigue and unexpected weight change.  HENT:  Negative for congestion, mouth sores and sinus pressure.   Eyes:  Negative for visual disturbance.  Respiratory:  Negative for cough and chest tightness.   Gastrointestinal:  Positive for abdominal pain. Negative for nausea and vomiting.  Genitourinary:  Negative for difficulty urinating, frequency and vaginal pain.  Musculoskeletal:  Positive for back pain. Negative for gait problem.  Skin:  Negative for pallor and rash.  Neurological:  Negative for dizziness, tremors, weakness, numbness and headaches.  Psychiatric/Behavioral:  Negative for confusion, sleep disturbance and suicidal ideas.     Objective:  BP 104/68   Pulse 78   Temp 97.9 F (36.6 C) (Oral)   Ht 5\' 5"  (1.651 m)   Wt 141 lb (64 kg)   SpO2 98%   BMI 23.46 kg/m   BP Readings from Last 3 Encounters:  11/06/23 104/68  10/15/23 100/64  08/05/23 110/64  Wt Readings from Last 3 Encounters:  11/06/23 141 lb (64 kg)  10/15/23 133 lb 8 oz (60.6 kg)  08/05/23 131 lb 6.4 oz (59.6 kg)    Physical Exam Constitutional:      General: She is not in acute distress.    Appearance: Normal appearance. She is well-developed.  HENT:     Head: Normocephalic.     Right Ear: External ear normal.     Left Ear: External ear normal.     Nose: Nose normal.  Eyes:     General:        Right eye: No discharge.        Left eye: No discharge.     Conjunctiva/sclera: Conjunctivae normal.     Pupils: Pupils are equal, round, and reactive to light.  Neck:     Thyroid: No thyromegaly.     Vascular: No JVD.     Trachea: No tracheal deviation.  Cardiovascular:     Rate and Rhythm: Normal rate and regular rhythm.     Heart sounds: Normal heart sounds.  Pulmonary:     Effort: No respiratory  distress.     Breath sounds: No stridor. No wheezing.  Abdominal:     General: Bowel sounds are normal. There is no distension.     Palpations: Abdomen is soft. There is no mass.     Tenderness: There is no abdominal tenderness. There is no guarding or rebound.  Musculoskeletal:        General: Tenderness present.     Cervical back: Normal range of motion and neck supple. No rigidity.  Lymphadenopathy:     Cervical: No cervical adenopathy.  Skin:    Findings: No erythema or rash.  Neurological:     Cranial Nerves: No cranial nerve deficit.     Motor: No abnormal muscle tone.     Coordination: Coordination normal.     Deep Tendon Reflexes: Reflexes normal.  Psychiatric:        Behavior: Behavior normal.        Thought Content: Thought content normal.        Judgment: Judgment normal.     Lab Results  Component Value Date   WBC 5.4 10/15/2023   HGB 13.2 10/15/2023   HCT 39.7 10/15/2023   PLT 240.0 10/15/2023   GLUCOSE 88 10/15/2023   CHOL 184 08/05/2023   TRIG 52 08/05/2023   HDL 107 08/05/2023   LDLCALC 67 08/05/2023   ALT 19 10/15/2023   AST 28 10/15/2023   NA 137 10/15/2023   K 4.1 10/15/2023   CL 99 10/15/2023   CREATININE 0.69 10/15/2023   BUN 19 10/15/2023   CO2 30 10/15/2023   TSH 4.21 02/03/2023    CT ABDOMEN PELVIS W CONTRAST Result Date: 11/02/2023 CLINICAL DATA:  Left lower quadrant pain EXAM: CT ABDOMEN AND PELVIS WITH CONTRAST TECHNIQUE: Multidetector CT imaging of the abdomen and pelvis was performed using the standard protocol following bolus administration of intravenous contrast. RADIATION DOSE REDUCTION: This exam was performed according to the departmental dose-optimization program which includes automated exposure control, adjustment of the mA and/or kV according to patient size and/or use of iterative reconstruction technique. CONTRAST:  ISOVUE-300 IOPAMIDOL (ISOVUE-300) INJECTION 61% COMPARISON:  Chest CT report 09/19/2005, PET CT report  10/27/2005, chest CT 10/19/2018, CT chest 03/10/2023 FINDINGS: Lower chest: Lung bases demonstrate stable 13 mm lingular pulmonary nodule over multiple prior exams for consistent with benign finding, no imaging follow-up is recommended Hepatobiliary: No calcified  gallstone. Mild intra hepatic biliary dilatation. Dilated common bile duct measures up to 13 mm. Pancreas: Unremarkable. No pancreatic ductal dilatation or surrounding inflammatory changes. Spleen: Normal in size without focal abnormality. Adrenals/Urinary Tract: Adrenal glands are normal. Kidneys show no hydronephrosis. Cysts and subcentimeter hypodensities too small to further characterize. No imaging follow-up recommended. Bladder is normal Stomach/Bowel: Stomach nonenlarged. No dilated small bowel. No acute bowel wall thickening. Vascular/Lymphatic: Aortic atherosclerosis. No enlarged abdominal or pelvic lymph nodes. Reproductive: Uterus and bilateral adnexa are unremarkable. Other: Negative for pelvic effusion or free air. Musculoskeletal: No acute or suspicious osseous abnormality IMPRESSION: 1. No CT evidence for acute intra-abdominal or pelvic abnormality. 2. Intra and extrahepatic biliary dilatation with common bile duct measuring up to 13 mm. Recommend correlation with LFTs and nonemergent follow-up MRCP. 3. Aortic atherosclerosis. Aortic Atherosclerosis (ICD10-I70.0). Electronically Signed   By: Jasmine Pang M.D.   On: 11/02/2023 18:10    Assessment & Plan:   Problem List Items Addressed This Visit     Hypothyroidism   Relevant Medications   oxyCODONE (OXYCONTIN) 40 mg 12 hr tablet   oxyCODONE (OXYCONTIN) 40 mg 12 hr tablet   oxyCODONE (OXYCONTIN) 80 mg 12 hr tablet   oxyCODONE (OXYCONTIN) 80 mg 12 hr tablet   oxyCODONE (OXYCONTIN) 80 mg 12 hr tablet   Low back pain   Chronic Continue with oxycodone/Oxycontin Rx - long term  Potential benefits of a long term opioids use as well as potential risks (i.e. addiction risk, apnea etc)  and complications (i.e. Somnolence, constipation and others) were explained to the patient and were aknowledged.      Relevant Medications   oxyCODONE (OXYCONTIN) 40 mg 12 hr tablet   oxyCODONE (OXYCONTIN) 40 mg 12 hr tablet   oxyCODONE (OXYCONTIN) 40 mg 12 hr tablet   oxyCODONE (OXYCONTIN) 80 mg 12 hr tablet   oxyCODONE (OXYCONTIN) 80 mg 12 hr tablet   oxyCODONE (OXYCONTIN) 80 mg 12 hr tablet   Benign lipomatous neoplasm of skin and subcutaneous tissue of left leg   Relevant Medications   oxyCODONE (OXYCONTIN) 40 mg 12 hr tablet   oxyCODONE (OXYCONTIN) 80 mg 12 hr tablet   oxyCODONE (OXYCONTIN) 80 mg 12 hr tablet   oxyCODONE (OXYCONTIN) 80 mg 12 hr tablet   LLQ abdominal pain - Primary   ?etioloy Abd CT did not show any abn findings in the LLQ Hyoscyamine prn      Dilated cbd, acquired   On CT: 13 mm - ?opioid use related vs other No abn LFTs, no RUQ pain Will get MRCP GI appt Dr Fredric Mare - April 2025      Relevant Orders   MR ABDOMEN WITH MRCP W CONTRAST      Meds ordered this encounter  Medications   hyoscyamine (LEVSIN) 0.125 MG tablet    Sig: Take 1-2 tablets (0.125-0.25 mg total) by mouth every 6 (six) hours as needed for cramping.    Dispense:  100 tablet    Refill:  1   oxyCODONE (OXYCONTIN) 40 mg 12 hr tablet    Sig: Take 1 tablet (40 mg total) by mouth every 6 (six) hours.    Dispense:  120 tablet    Refill:  0    Please fill on or after 11/12/23   oxyCODONE (OXYCONTIN) 40 mg 12 hr tablet    Sig: Take 1 tablet (40 mg total) by mouth every 6 (six) hours.    Dispense:  120 tablet    Refill:  0  Dx: M54.5 and G89.4 Please fill on or after 12/12/23   oxyCODONE (OXYCONTIN) 40 mg 12 hr tablet    Sig: Take 1 tablet (40 mg total) by mouth every 6 (six) hours.    Dispense:  120 tablet    Refill:  0    Dx: M54.5 and G89.4 Please fill on or after 01/11/24   oxyCODONE (OXYCONTIN) 80 mg 12 hr tablet    Sig: Take 1 tablet (80 mg total) by mouth every 6 (six) hours.     Dispense:  120 tablet    Refill:  0    Dx: M54.5 and G89.4 Please fill on or after 11/12/23   oxyCODONE (OXYCONTIN) 80 mg 12 hr tablet    Sig: Take 1 tablet (80 mg total) by mouth every 6 (six) hours.    Dispense:  120 tablet    Refill:  0    Dx: M54.5 and G89.4 Please fill on or after 12/12/23   oxyCODONE (OXYCONTIN) 80 mg 12 hr tablet    Sig: Take 1 tablet (80 mg total) by mouth every 6 (six) hours.    Dispense:  120 tablet    Refill:  0    Please fill on or after 01/11/24      Follow-up: Return in about 3 months (around 02/03/2024) for a follow-up visit.  Sonda Primes, MD

## 2023-11-12 ENCOUNTER — Encounter: Payer: Self-pay | Admitting: Internal Medicine

## 2023-11-13 ENCOUNTER — Encounter: Payer: Self-pay | Admitting: Internal Medicine

## 2023-11-16 ENCOUNTER — Encounter: Payer: Self-pay | Admitting: Internal Medicine

## 2023-11-17 ENCOUNTER — Ambulatory Visit: Payer: Medicare HMO

## 2023-11-18 ENCOUNTER — Encounter: Payer: Self-pay | Admitting: Internal Medicine

## 2023-11-19 ENCOUNTER — Encounter: Payer: Self-pay | Admitting: Internal Medicine

## 2023-11-24 ENCOUNTER — Ambulatory Visit
Admission: RE | Admit: 2023-11-24 | Discharge: 2023-11-24 | Disposition: A | Discharge status: Home / Self Care | Payer: Medicare HMO | Source: Ambulatory Visit | Attending: Internal Medicine | Admitting: Internal Medicine

## 2023-11-24 DIAGNOSIS — K838 Other specified diseases of biliary tract: Secondary | ICD-10-CM

## 2023-11-24 MED ORDER — GADOPICLENOL 0.5 MMOL/ML IV SOLN
6.0000 mL | Freq: Once | INTRAVENOUS | Status: AC | PRN
  Administered 2023-11-24: 6 mL via INTRAVENOUS

## 2023-11-30 ENCOUNTER — Telehealth: Payer: Self-pay

## 2023-11-30 NOTE — Telephone Encounter (Signed)
 LVM for pt to call the clinic back as this is my 2nd attempt at contacting pt to inform her of her recent MRI of the abdomen.. Per Dr. Posey Rea "Abigail Wilson, Please inform the patient that her liver MRI shows mild diffuse biliary ductal dilatation. No evidence of choledocholithiasis, biliary stricture, or other significant abnormality."

## 2023-11-30 NOTE — Telephone Encounter (Signed)
 Copied from CRM [id number]. Topic: General - Other >> Nov 30, 2023  9:45 AM Aletta Edouard wrote: Reason for CRM: patient received a call from the office there where no notes on her account she would like a call back regarding this

## 2023-12-01 NOTE — Telephone Encounter (Signed)
 3rd attempt at trying to contact pt about MRI of there abdomen. Please inform pt she can go on [facility] and check these results a/w providers note.

## 2023-12-01 NOTE — Telephone Encounter (Signed)
 Patient returned Maria's call and would like a call back at [phone].

## 2023-12-03 ENCOUNTER — Telehealth: Payer: Self-pay

## 2023-12-03 NOTE — Telephone Encounter (Signed)
 Spoke with the pt and was able to print and send a copy of MRI via mail.

## 2023-12-03 NOTE — Telephone Encounter (Signed)
 Copied from CRM [id number]. Topic: Clinical - Lab/Test Results >> Dec 02, 2023  3:35 PM Elizebeth Brooking wrote: Reason for CRM: Patient called in wanting to speak with Byrd Hesselbach regarding test results. Call [facility] for further assistance, Byrd Hesselbach will call back once done with patients >> Dec 03, 2023 11:31 AM Truddie Crumble wrote: Patient called stating no one called her regarding her MRI results. I let the patient know that the office sent the results to her mychart. The patient stated she does not use mychart and the office knows that. Patient stated she would like a copy of her MRI results so she can come and pick them up

## 2023-12-15 ENCOUNTER — Encounter: Payer: Medicare Other | Admitting: Obstetrics and Gynecology

## 2023-12-17 ENCOUNTER — Ambulatory Visit: Payer: Medicare HMO | Admitting: Gastroenterology

## 2023-12-21 ENCOUNTER — Other Ambulatory Visit: Payer: Self-pay | Admitting: Internal Medicine

## 2024-01-11 DIAGNOSIS — Z794 Long term (current) use of insulin: Secondary | ICD-10-CM | POA: Diagnosis not present

## 2024-01-11 DIAGNOSIS — E785 Hyperlipidemia, unspecified: Secondary | ICD-10-CM | POA: Diagnosis not present

## 2024-01-11 DIAGNOSIS — E1142 Type 2 diabetes mellitus with diabetic polyneuropathy: Secondary | ICD-10-CM | POA: Diagnosis not present

## 2024-01-18 ENCOUNTER — Ambulatory Visit (INDEPENDENT_AMBULATORY_CARE_PROVIDER_SITE_OTHER)

## 2024-01-18 VITALS — Ht 65.0 in | Wt 141.0 lb

## 2024-01-18 DIAGNOSIS — Z0000 Encounter for general adult medical examination without abnormal findings: Secondary | ICD-10-CM | POA: Diagnosis not present

## 2024-01-18 DIAGNOSIS — Z1231 Encounter for screening mammogram for malignant neoplasm of breast: Secondary | ICD-10-CM

## 2024-01-18 NOTE — Progress Notes (Cosign Needed Addendum)
 Subjective:   Abigail Wilson is a 63 y.o. who presents for a Medicare Wellness preventive visit.  Visit Complete: Virtual I connected with  Abigail Wilson on 01/18/24 by a audio enabled telemedicine application and verified that I am speaking with the correct person using two identifiers.  Patient Location: Home  Provider Location: Home Office  I discussed the limitations of evaluation and management by telemedicine. The patient expressed understanding and agreed to proceed.  Vital Signs: Because this visit was a virtual/telehealth visit, some criteria may be missing or patient reported. Any vitals not documented were not able to be obtained and vitals that have been documented are patient reported.  VideoDeclined- This patient declined Librarian, academic. Therefore the visit was completed with audio only.  Persons Participating in Visit: Patient.  AWV Questionnaire: No: Patient Medicare AWV questionnaire was not completed prior to this visit.  Cardiac Risk Factors include: advanced age (>63men, >73 women);Other (see comment), Risk factor comments: Coronary atherosclerosis, COPD     Objective:    Today's Vitals   01/18/24 1048  Weight: 141 lb (64 kg)  Height: 5\' 5"  (1.651 m)   Body mass index is 23.46 kg/m.     01/18/2024   10:56 AM 11/25/2022    7:19 PM 11/19/2021    9:26 AM 09/06/2021    8:34 PM 11/12/2020    3:16 PM 03/27/2017   12:55 PM 03/13/2017    1:30 PM  Advanced Directives  Does Patient Have a Medical Advance Directive? No No No No No No No  Would patient like information on creating a medical advance directive?  Yes (MAU/Ambulatory/Procedural Areas - Information given) No - Patient declined  No - Patient declined      Current Medications (verified) Outpatient Encounter Medications as of 01/18/2024  Medication Sig   aspirin 81 MG EC tablet Take 81 mg by mouth daily.   B Complex-C (SUPER B COMPLEX PO) Take by mouth.    Cholecalciferol 3000 units TABS Take 2 tablets by mouth daily.   COENZYME Q-10 PO Take 200 mg by mouth daily.   docusate sodium (COLACE) 100 MG capsule Take 100 mg by mouth 2 (two) times daily.   fluticasone  (FLONASE ) 50 MCG/ACT nasal spray shake liquid & use 2 sprays in each nostril daily   furosemide  (LASIX ) 40 MG tablet Take 1 tablet (40 mg total) by mouth daily.   hyoscyamine  (LEVSIN) 0.125 MG tablet Take 1-2 tablets (0.125-0.25 mg total) by mouth every 6 (six) hours as needed for cramping.   levothyroxine  (SYNTHROID ) 100 MCG tablet Take 1 tablet (100 mcg total) by mouth daily.   naloxone  (NARCAN ) nasal spray 4 mg/0.1 mL 1 actuation in one nostril once. May repeat in 2-3 min   oxyCODONE  (OXYCONTIN ) 40 mg 12 hr tablet Take 1 tablet (40 mg total) by mouth every 6 (six) hours.   oxyCODONE  (OXYCONTIN ) 40 mg 12 hr tablet Take 1 tablet (40 mg total) by mouth every 6 (six) hours.   oxyCODONE  (OXYCONTIN ) 80 mg 12 hr tablet Take 1 tablet (80 mg total) by mouth every 6 (six) hours.   oxyCODONE  (OXYCONTIN ) 80 mg 12 hr tablet Take 1 tablet (80 mg total) by mouth every 6 (six) hours.   potassium chloride  SA (KLOR-CON  M) 20 MEQ tablet TAKE ONE TABLET BY MOUTH DAILY   rosuvastatin  (CRESTOR ) 20 MG tablet Take 1 tablet (20 mg total) by mouth daily.   Sennosides (SENNA LAX PO) Take by mouth.   [DISCONTINUED] oxyCODONE  (  OXYCONTIN ) 40 mg 12 hr tablet Take 1 tablet (40 mg total) by mouth every 6 (six) hours.   [DISCONTINUED] oxyCODONE  (OXYCONTIN ) 80 mg 12 hr tablet Take 1 tablet (80 mg total) by mouth every 6 (six) hours.   No facility-administered encounter medications on file as of 01/18/2024.    Allergies (verified) Nsaids and Trazodone hcl   History: Past Medical History:  Diagnosis Date   Asthmatic bronchitis    Mild   Cataract    forming   Depression    Hepatitis C    Hyperthyroidism    s/p 131Iodine Rx 2007   Hypothyroidism    LBP (low back pain)    Menopause    OA (osteoarthritis of spine)     Osteoporosis    Past Surgical History:  Procedure Laterality Date   CESAREAN SECTION     COLONOSCOPY     COLONOSCOPY W/ POLYPECTOMY     Family History  Problem Relation Age of Onset   Colon polyps Father    Heart disease Father    Hyperlipidemia Father    Hypertension Father    Diabetes Father    Cancer Father    Coronary artery disease Father    Hypertension Other    Colon cancer Neg Hx    Pancreatic cancer Neg Hx    Stomach cancer Neg Hx    Esophageal cancer Neg Hx    Social History   Socioeconomic History   Marital status: Married    Spouse name: Allayne Arabian   Number of children: 2   Years of education: Not on file   Highest education level: Not on file  Occupational History   Occupation: retired    Associate Professor: UNEMPLOYED  Tobacco Use   Smoking status: Every Day    Current packs/day: 0.25    Average packs/day: 0.3 packs/day for 2.3 years (0.6 ttl pk-yrs)    Types: Cigarettes    Start date: 09/15/2021    Last attempt to quit: 03/31/2013   Smokeless tobacco: Former   Tobacco comments:    Past hx vape   Vaping Use   Vaping status: Some Days  Substance and Sexual Activity   Alcohol use: Not Currently    Comment: OCC    Drug use: No   Sexual activity: Not Currently    Comment: 1st intercourse- 15, partners- 4, married- 29 yrs   Other Topics Concern   Not on file  Social History Narrative   Lives with husband   Social Drivers of Health   Financial Resource Strain: Low Risk  (01/18/2024)   Overall Financial Resource Strain (CARDIA)    Difficulty of Paying Living Expenses: Not hard at all  Food Insecurity: No Food Insecurity (01/18/2024)   Hunger Vital Sign    Worried About Running Out of Food in the Last Year: Never true    Ran Out of Food in the Last Year: Never true  Transportation Needs: No Transportation Needs (01/18/2024)   PRAPARE - Administrator, Civil Service (Medical): No    Lack of Transportation (Non-Medical): No  Physical Activity:  Insufficiently Active (01/18/2024)   Exercise Vital Sign    Days of Exercise per Week: 2 days    Minutes of Exercise per Session: 60 min  Stress: No Stress Concern Present (01/18/2024)   [employer] of Occupational Health - Occupational Stress Questionnaire    Feeling of Stress : Only a little  Social Connections: Moderately Isolated (01/18/2024)   Social Connection and Isolation Panel [NHANES]  Frequency of Communication with Friends and Family: More than three times a week    Frequency of Social Gatherings with Friends and Family: Three times a week    Attends Religious Services: Never    Active Member of Clubs or Organizations: No    Attends Engineer, structural: Never    Marital Status: Married    Tobacco Counseling Ready to quit: Not Answered Counseling given: Not Answered Tobacco comments: Past hx vape     Clinical Intake:  Pre-visit preparation completed: Yes  Pain : No/denies pain     BMI - recorded: 23.46 Nutritional Status: BMI of 19-24  Normal Nutritional Risks: None Diabetes: No  No results found for: "HGBA1C"   How often do you need to have someone help you when you read instructions, pamphlets, or other written materials from your doctor or pharmacy?: 1 - Never  Interpreter Needed?: No  Information entered by :: Warrene Kapfer, RMA   Activities of Daily Living     01/18/2024   10:49 AM  In your present state of health, do you have any difficulty performing the following activities:  Hearing? 0  Vision? 0  Difficulty concentrating or making decisions? 0  Walking or climbing stairs? 0  Dressing or bathing? 0  Doing errands, shopping? 0  Preparing Food and eating ? N  Using the Toilet? N  In the past six months, have you accidently leaked urine? N  Do you have problems with loss of bowel control? N  Managing your Medications? N  Managing your Finances? N  Housekeeping or managing your Housekeeping? N    Patient Care  Team: Plotnikov, Oakley Bellman, MD as PCP - General Audery Blazing, Deannie Fabian, MD as PCP - Cardiology (Cardiology) Gwyndolyn Lerner, MD (Inactive) as Attending Physician (Endocrinology) Davia Erps, MD (Inactive) (Obstetrics and Gynecology) Asencion Blacksmith, MD (Inactive) (Gastroenterology) Maris Sickle, MD as Consulting Physician (Ophthalmology)  Indicate any recent Medical Services you may have received from other than [facility] providers in the past year (date may be approximate).     Assessment:   This is a routine wellness examination for Abigail Wilson.  Hearing/Vision screen Hearing Screening - Comments:: Denies hearing difficulties   Vision Screening - Comments:: Wears eyeglasses   Goals Addressed             This Visit's Progress    Patient Stated   On track    To get better control of my thyroid  condition.       Depression Screen     01/18/2024   10:58 AM 08/05/2023    1:09 PM 05/06/2023    2:10 PM 02/03/2023    3:16 PM 11/25/2022    7:17 PM 11/05/2022    1:38 PM 11/06/2021    3:11 PM  PHQ 2/9 Scores  PHQ - 2 Score 0 0 0 0 0 0 0  PHQ- 9 Score 0          Fall Risk     01/18/2024   10:56 AM 08/05/2023    1:09 PM 05/06/2023    2:10 PM 02/03/2023    3:16 PM 11/25/2022    7:16 PM  Fall Risk   Falls in the past year? 0 0 0 0 0  Number falls in past yr: 0 0 0 0 0  Injury with Fall? 0 0 0 0 0  Risk for fall due to : No Fall Risks;Medication side effect No Fall Risks No Fall Risks No Fall Risks No Fall Risks  Follow up Falls prevention discussed;Falls evaluation completed Falls evaluation completed Falls evaluation completed Falls evaluation completed Falls prevention discussed;Education provided;Falls evaluation completed    MEDICARE RISK AT HOME:  Medicare Risk at Home Any stairs in or around the home?: Yes (outside in front) If so, are there any without handrails?: Yes Home free of loose throw rugs in walkways, pet beds, electrical cords, etc?: Yes Adequate lighting in your  home to reduce risk of falls?: Yes Life alert?: No Use of a cane, walker or w/c?: No Grab bars in the bathroom?: Yes Shower chair or bench in shower?: No Elevated toilet seat or a handicapped toilet?: No  TIMED UP AND GO:  Was the test performed?  No  Cognitive Function: Declined/Normal: No cognitive concerns noted by patient or family. Patient alert, oriented, able to answer questions appropriately and recall recent events. No signs of memory loss or confusion.        11/25/2022    7:20 PM  6CIT Screen  What Year? 0 points  What month? 0 points  What time? 0 points  Count back from 20 0 points  Months in reverse 0 points  Repeat phrase 0 points  Total Score 0 points    Immunizations Immunization History  Administered Date(s) Administered   H1N1 08/22/2008   Influenza Split 07/22/2011, 07/02/2012   Influenza Whole 06/13/2009, 06/28/2010   Influenza, High Dose Seasonal PF 06/16/2016, 06/23/2017, 07/05/2018   Influenza, Quadrivalent, Recombinant, Inj, Pf 06/15/2022   Influenza,inj,Quad PF,6+ Mos 07/19/2013, 07/17/2014, 07/16/2015, 05/16/2019, 07/14/2020   Influenza,inj,Quad PF,6-35 Mos 07/09/2021   Influenza-Unspecified 07/05/2023   PNEUMOCOCCAL CONJUGATE-20 05/14/2021   Pneumococcal Conjugate-13 10/02/2014   Pneumococcal Polysaccharide-23 12/09/2011   Rsv, Bivalent, Protein Subunit Rsvpref,pf (Abrysvo) 07/01/2023   Td 04/28/2002   Tdap 01/02/2015    Screening Tests Health Maintenance  Topic Date Due   HIV Screening  Never done   Zoster Vaccines- Shingrix (1 of 2) Never done   MAMMOGRAM  04/18/2016   Cervical Cancer Screening (HPV/Pap Cotest)  01/25/2021   Medicare Annual Wellness (AWV)  11/25/2023   INFLUENZA VACCINE  04/15/2024   DTaP/Tdap/Td (3 - Td or Tdap) 01/01/2025   Colonoscopy  03/26/2025   Pneumococcal Vaccine 44-39 Years old  Completed   Hepatitis C Screening  Completed   HPV VACCINES  Aged Out   Meningococcal B Vaccine  Aged Out   COVID-19 Vaccine   Discontinued    Health Maintenance  Health Maintenance Due  Topic Date Due   HIV Screening  Never done   Zoster Vaccines- Shingrix (1 of 2) Never done   MAMMOGRAM  04/18/2016   Cervical Cancer Screening (HPV/Pap Cotest)  01/25/2021   Medicare Annual Wellness (AWV)  11/25/2023   Health Maintenance Items Addressed: Mammogram ordered, See Nurse Notes  Additional Screening:  Vision Screening: Recommended annual ophthalmology exams for early detection of glaucoma and other disorders of the eye.  Dental Screening: Recommended annual dental exams for proper oral hygiene  Community Resource Referral / Chronic Care Management: CRR required this visit?  No   CCM required this visit?  No     Plan:     I have personally reviewed and noted the following in the patient's chart:   Medical and social history Use of alcohol, tobacco or illicit drugs  Current medications and supplements including opioid prescriptions. Patient is currently taking opioid prescriptions. Information provided to patient regarding non-opioid alternatives. Patient advised to discuss non-opioid treatment plan with their provider. Functional ability and status Nutritional status  Physical activity Advanced directives List of other physicians Hospitalizations, surgeries, and ER visits in previous 12 months Vitals Screenings to include cognitive, depression, and falls Referrals and appointments  In addition, I have reviewed and discussed with patient certain preventive protocols, quality metrics, and best practice recommendations. A written personalized care plan for preventive services as well as general preventive health recommendations were provided to patient.     Cyncere Ruhe L Saleemah Mollenhauer, CMA   01/18/2024   After Visit Summary: (MyChart) Due to this being a telephonic visit, the after visit summary with patients personalized plan was offered to patient via MyChart   Notes: Please refer to Routing  Comments.  Medical screening examination/treatment/procedure(s) were performed by non-physician practitioner and as supervising physician I was immediately available for consultation/collaboration.  I agree with above. Adelaide Holy, MD

## 2024-01-18 NOTE — Patient Instructions (Addendum)
 Abigail Wilson , Thank you for taking time to come for your Medicare Wellness Visit. I appreciate your ongoing commitment to your health goals. Please review the following plan we discussed and let me know if I can assist you in the future.   Referrals/Orders/Follow-Ups/Clinician Recommendations: Ir ws nice talking with you today.  You are due for a shingles vaccine.  You have an order for:  [x]   3D Mammogram    Please call for appointment:  [facility] [address] [place], [place] [address] [phone]  Make sure to wear two-piece clothing.  No lotions, powders, or deodorants the day of the appointment. Make sure to bring picture ID and insurance card.  Bring list of medications you are currently taking including any supplements.    This is a list of the screening recommended for you and due dates:  Health Maintenance  Topic Date Due   HIV Screening  Never done   Zoster (Shingles) Vaccine (1 of 2) Never done   Mammogram  04/18/2016   Pap with HPV screening  01/25/2021   Medicare Annual Wellness Visit  11/25/2023   Flu Shot  04/15/2024   DTaP/Tdap/Td vaccine (3 - Td or Tdap) 01/01/2025   Colon Cancer Screening  03/26/2025   Pneumococcal Vaccination  Completed   Hepatitis C Screening  Completed   HPV Vaccine  Aged Out   Meningitis B Vaccine  Aged Out   COVID-19 Vaccine  Discontinued    Advanced directives: (Declined) Advance directive discussed with you today. Even though you declined this today, please call our office should you change your mind, and we can give you the proper paperwork for you to fill out.  Next Medicare Annual Wellness Visit scheduled for next year: Yes  Have you seen your provider in the last 6 months (3 months if uncontrolled diabetes)? Yes  Managing Pain Without Opioids Opioids are strong medicines used to treat moderate to severe pain. For some people, especially those who have long-term (chronic) pain, opioids may not be the  best choice for pain management due to: Side effects like nausea, constipation, and sleepiness. The risk of addiction (opioid use disorder). The longer you take opioids, the greater your risk of addiction. Pain that lasts for more than 3 months is called chronic pain. Managing chronic pain usually requires more than one approach and is often provided by a team of health care providers working together (multidisciplinary approach). Pain management may be done at a pain management center or pain clinic. How to manage pain without the use of opioids Use non-opioid medicines Non-opioid medicines for pain may include: Over-the-counter or prescription non-steroidal anti-inflammatory drugs (NSAIDs). These may be the first medicines used for pain. They work well for muscle and bone pain, and they reduce swelling. Acetaminophen. This over-the-counter medicine may work well for milder pain but not swelling. Antidepressants. These may be used to treat chronic pain. A certain type of antidepressant (tricyclics) is often used. These medicines are given in lower doses for pain than when used for depression. Anticonvulsants. These are usually used to treat seizures but may also reduce nerve (neuropathic) pain. Muscle relaxants. These relieve pain caused by sudden muscle tightening (spasms). You may also use a pain medicine that is applied to the skin as a patch, cream, or gel (topical analgesic), such as a numbing medicine. These may cause fewer side effects than medicines taken by mouth. Do certain therapies as directed Some therapies can help with pain management. They include:  Physical therapy. You will do exercises to gain strength and flexibility. A physical therapist may teach you exercises to move and stretch parts of your body that are weak, stiff, or painful. You can learn these exercises at physical therapy visits and practice them at home. Physical therapy may also involve: Massage. Heat wraps or  applying heat or cold to affected areas. Electrical signals that interrupt pain signals (transcutaneous electrical nerve stimulation, TENS). Weak lasers that reduce pain and swelling (low-level laser therapy). Signals from your body that help you learn to regulate pain (biofeedback). Occupational therapy. This helps you to learn ways to function at home and work with less pain. Recreational therapy. This involves trying new activities or hobbies, such as a physical activity or drawing. Mental health therapy, including: Cognitive behavioral therapy (CBT). This helps you learn coping skills for dealing with pain. Acceptance and commitment therapy (ACT) to change the way you think and react to pain. Relaxation therapies, including muscle relaxation exercises and mindfulness-based stress reduction. Pain management counseling. This may be individual, family, or group counseling.  Receive medical treatments Medical treatments for pain management include: Nerve block injections. These may include a pain blocker and anti-inflammatory medicines. You may have injections: Near the spine to relieve chronic back or neck pain. Into joints to relieve back or joint pain. Into nerve areas that supply a painful area to relieve body pain. Into muscles (trigger point injections) to relieve some painful muscle conditions. A medical device placed near your spine to help block pain signals and relieve nerve pain or chronic back pain (spinal cord stimulation device). Acupuncture. Follow these instructions at home Medicines Take over-the-counter and prescription medicines only as told by your health care provider. If you are taking pain medicine, ask your health care providers about possible side effects to watch out for. Do not drive or use heavy machinery while taking prescription opioid pain medicine. Lifestyle  Do not use drugs or alcohol to reduce pain. If you drink alcohol, limit how much you have to: 0-1  drink a day for women who are not pregnant. 0-2 drinks a day for men. Know how much alcohol is in a drink. In the [place] one drink equals one 12 oz bottle of beer (355 mL), one 5 oz glass of wine (148 mL), or one 1 oz glass of hard liquor (44 mL). Do not use any products that contain nicotine or tobacco. These products include cigarettes, chewing tobacco, and vaping devices, such as e-cigarettes. If you need help quitting, ask your health care provider. Eat a healthy diet and maintain a healthy weight. Poor diet and excess weight may make pain worse. Eat foods that are high in fiber. These include fresh fruits and vegetables, whole grains, and beans. Limit foods that are high in fat and processed sugars, such as fried and sweet foods. Exercise regularly. Exercise lowers stress and may help relieve pain. Ask your health care provider what activities and exercises are safe for you. If your health care provider approves, join an exercise class that combines movement and stress reduction. Examples include yoga and tai chi. Get enough sleep. Lack of sleep may make pain worse. Lower stress as much as possible. Practice stress reduction techniques as told by your therapist. General instructions Work with all your pain management providers to find the treatments that work best for you. You are an important member of your pain management team. There are many things you can do to reduce pain on your own. Consider joining  an online or in-person support group for people who have chronic pain. Keep all follow-up visits. This is important. Where to find more information You can find more information about managing pain without opioids from: American Academy of Pain Medicine: painmed.org Institute for Chronic Pain: instituteforchronicpain.org American Chronic Pain Association: theacpa.org Contact a health care provider if: You have side effects from pain medicine. Your pain gets worse or does not get better  with treatments or home therapy. You are struggling with anxiety or depression. Summary Many types of pain can be managed without opioids. Chronic pain may respond better to pain management without opioids. Pain is best managed when you and a team of health care providers work together. Pain management without opioids may include non-opioid medicines, medical treatments, physical therapy, mental health therapy, and lifestyle changes. Tell your health care providers if your pain gets worse or is not being managed well enough. This information is not intended to replace advice given to you by your health care provider. Make sure you discuss any questions you have with your health care provider. Document Revised: 12/12/2020 Document Reviewed: 12/12/2020 Elsevier Patient Education  2024 [employer].

## 2024-02-02 ENCOUNTER — Ambulatory Visit: Payer: Medicare HMO | Admitting: Internal Medicine

## 2024-02-04 ENCOUNTER — Encounter: Payer: Self-pay | Admitting: Internal Medicine

## 2024-02-04 ENCOUNTER — Ambulatory Visit (INDEPENDENT_AMBULATORY_CARE_PROVIDER_SITE_OTHER): Payer: Medicare HMO | Admitting: Internal Medicine

## 2024-02-04 VITALS — BP 122/80 | HR 71 | Temp 98.2°F | Ht 65.0 in | Wt 130.4 lb

## 2024-02-04 DIAGNOSIS — D1724 Benign lipomatous neoplasm of skin and subcutaneous tissue of left leg: Secondary | ICD-10-CM | POA: Diagnosis not present

## 2024-02-04 DIAGNOSIS — E890 Postprocedural hypothyroidism: Secondary | ICD-10-CM

## 2024-02-04 DIAGNOSIS — G8929 Other chronic pain: Secondary | ICD-10-CM | POA: Diagnosis not present

## 2024-02-04 DIAGNOSIS — F329 Major depressive disorder, single episode, unspecified: Secondary | ICD-10-CM | POA: Diagnosis not present

## 2024-02-04 DIAGNOSIS — M545 Low back pain, unspecified: Secondary | ICD-10-CM

## 2024-02-04 DIAGNOSIS — E034 Atrophy of thyroid (acquired): Secondary | ICD-10-CM | POA: Diagnosis not present

## 2024-02-04 MED ORDER — OXYCODONE HCL ER 40 MG PO T12A: 40.0000 mg | EXTENDED_RELEASE_TABLET | Freq: Four times a day (QID) | ORAL | 0 refills | Status: DC

## 2024-02-04 MED ORDER — OXYCODONE HCL ER 80 MG PO T12A
80.0000 mg | EXTENDED_RELEASE_TABLET | Freq: Four times a day (QID) | ORAL | 0 refills | Status: DC
Start: 2024-02-04 — End: 2024-05-04

## 2024-02-04 MED ORDER — OXYCODONE HCL ER 80 MG PO T12A: 80.0000 mg | EXTENDED_RELEASE_TABLET | Freq: Four times a day (QID) | ORAL | 0 refills | Status: DC

## 2024-02-04 MED ORDER — OXYCODONE HCL ER 40 MG PO T12A
40.0000 mg | EXTENDED_RELEASE_TABLET | Freq: Four times a day (QID) | ORAL | 0 refills | Status: DC
Start: 2024-02-04 — End: 2024-05-04

## 2024-02-04 NOTE — Assessment & Plan Note (Signed)
Chronic Continue with oxycodone/Oxycontin Rx - long term  Potential benefits of a long term opioids use as well as potential risks (i.e. addiction risk, apnea etc) and complications (i.e. Somnolence, constipation and others) were explained to the patient and were aknowledged. 

## 2024-02-04 NOTE — Progress Notes (Signed)
 Subjective:  Patient ID: Abigail Wilson, female    DOB: [birth year]  Age: 63 y.o. MRN: [id number]  CC: Medical Management of Chronic Issues (3 mnth f/u)   HPI SHERLIE BOYUM presents for LBP, hypothyroidism, no wt loss  Outpatient Medications Prior to Visit  Medication Sig Dispense Refill   aspirin 81 MG EC tablet Take 81 mg by mouth daily.     B Complex-C (SUPER B COMPLEX PO) Take by mouth.     Cholecalciferol 3000 units TABS Take 2 tablets by mouth daily.     COENZYME Q-10 PO Take 200 mg by mouth daily.     docusate sodium (COLACE) 100 MG capsule Take 100 mg by mouth 2 (two) times daily.     fluticasone  (FLONASE ) 50 MCG/ACT nasal spray shake liquid & use 2 sprays in each nostril daily 16 g 5   furosemide  (LASIX ) 40 MG tablet Take 1 tablet (40 mg total) by mouth daily. 90 tablet 1   hyoscyamine  (LEVSIN) 0.125 MG tablet Take 1-2 tablets (0.125-0.25 mg total) by mouth every 6 (six) hours as needed for cramping. 100 tablet 1   levothyroxine  (SYNTHROID ) 100 MCG tablet Take 1 tablet (100 mcg total) by mouth daily. 90 tablet 3   naloxone  (NARCAN ) nasal spray 4 mg/0.1 mL 1 actuation in one nostril once. May repeat in 2-3 min 1 each 2   potassium chloride  SA (KLOR-CON  M) 20 MEQ tablet TAKE ONE TABLET BY MOUTH DAILY 90 tablet 3   rosuvastatin  (CRESTOR ) 20 MG tablet Take 1 tablet (20 mg total) by mouth daily. 90 tablet 3   Sennosides (SENNA LAX PO) Take by mouth.     oxyCODONE  (OXYCONTIN ) 40 mg 12 hr tablet Take 1 tablet (40 mg total) by mouth every 6 (six) hours. 120 tablet 0   oxyCODONE  (OXYCONTIN ) 40 mg 12 hr tablet Take 1 tablet (40 mg total) by mouth every 6 (six) hours. 120 tablet 0   oxyCODONE  (OXYCONTIN ) 80 mg 12 hr tablet Take 1 tablet (80 mg total) by mouth every 6 (six) hours. 120 tablet 0   oxyCODONE  (OXYCONTIN ) 80 mg 12 hr tablet Take 1 tablet (80 mg total) by mouth every 6 (six) hours. 120 tablet 0   No facility-administered medications prior to visit.    ROS: Review of  Systems  Constitutional:  Negative for activity change, appetite change, chills, fatigue and unexpected weight change.  HENT:  Negative for congestion, mouth sores and sinus pressure.   Eyes:  Negative for visual disturbance.  Respiratory:  Negative for cough and chest tightness.   Gastrointestinal:  Negative for abdominal pain and nausea.  Genitourinary:  Negative for difficulty urinating, frequency and vaginal pain.  Musculoskeletal:  Negative for back pain and gait problem.  Skin:  Negative for pallor and rash.  Neurological:  Negative for dizziness, tremors, weakness, numbness and headaches.  Psychiatric/Behavioral:  Negative for confusion and sleep disturbance. The patient is not nervous/anxious.     Objective:  BP 122/80 (BP Location: Left Arm, Patient Position: Sitting)   Pulse 71   Temp 98.2 F (36.8 C) (Temporal)   Ht 5\' 5"  (1.651 m)   Wt 130 lb 6.4 oz (59.1 kg)   SpO2 94%   BMI 21.70 kg/m   BP Readings from Last 3 Encounters:  02/04/24 122/80  11/06/23 104/68  10/15/23 100/64    Wt Readings from Last 3 Encounters:  02/04/24 130 lb 6.4 oz (59.1 kg)  01/18/24 141 lb (64 kg)  11/06/23 141 lb (  64 kg)    Physical Exam Constitutional:      General: She is not in acute distress.    Appearance: She is well-developed.  HENT:     Head: Normocephalic.     Right Ear: External ear normal.     Left Ear: External ear normal.     Nose: Nose normal.  Eyes:     General:        Right eye: No discharge.        Left eye: No discharge.     Conjunctiva/sclera: Conjunctivae normal.     Pupils: Pupils are equal, round, and reactive to light.  Neck:     Thyroid : No thyromegaly.     Vascular: No JVD.     Trachea: No tracheal deviation.  Cardiovascular:     Rate and Rhythm: Normal rate and regular rhythm.     Heart sounds: Normal heart sounds.  Pulmonary:     Effort: No respiratory distress.     Breath sounds: No stridor. No wheezing.  Abdominal:     General: Bowel sounds  are normal. There is no distension.     Palpations: Abdomen is soft. There is no mass.     Tenderness: There is no abdominal tenderness. There is no guarding or rebound.  Musculoskeletal:        General: No tenderness.     Cervical back: Normal range of motion and neck supple. No rigidity.  Lymphadenopathy:     Cervical: No cervical adenopathy.  Skin:    Findings: No erythema or rash.  Neurological:     Cranial Nerves: No cranial nerve deficit.     Motor: No abnormal muscle tone.     Coordination: Coordination normal.     Deep Tendon Reflexes: Reflexes normal.  Psychiatric:        Behavior: Behavior normal.        Thought Content: Thought content normal.        Judgment: Judgment normal.     Lab Results  Component Value Date   WBC 5.4 10/15/2023   HGB 13.2 10/15/2023   HCT 39.7 10/15/2023   PLT 240.0 10/15/2023   GLUCOSE 88 10/15/2023   CHOL 184 08/05/2023   TRIG 52 08/05/2023   HDL 107 08/05/2023   LDLCALC 67 08/05/2023   ALT 19 10/15/2023   AST 28 10/15/2023   NA 137 10/15/2023   K 4.1 10/15/2023   CL 99 10/15/2023   CREATININE 0.69 10/15/2023   BUN 19 10/15/2023   CO2 30 10/15/2023   TSH 4.21 02/03/2023    MR ABDOMEN MRCP W WO CONTAST Result Date: 11/24/2023 CLINICAL DATA:  Abdominal pain. Biliary ductal dilatation on recent CT. EXAM: MRI ABDOMEN WITHOUT AND WITH CONTRAST (INCLUDING MRCP) TECHNIQUE: Multiplanar multisequence MR imaging of the abdomen was performed both before and after the administration of intravenous contrast. Heavily T2-weighted images of the biliary and pancreatic ducts were obtained, and three-dimensional MRCP images were rendered by post processing. CONTRAST:  6 mL V[employer]way  COMPARISON:  CT on 11/02/2023 FINDINGS: Lower chest: No acute findings. Hepatobiliary: No hepatic masses identified. Tiny sub-cm cyst noted in segment 4B of the left lobe. Gallbladder is unremarkable. Mild diffuse biliary ductal dilatation is seen with common bile duct measuring  13 mm in diameter. No No evidence of choledocholithiasis or biliary stricture. Pancreas: No mass or inflammatory changes. No evidence of pancreatic ductal dilatation or pancreas divisum. Spleen:  Within normal limits in size and appearance. Adrenals/Urinary Tract: No suspicious masses identified. Small  benign-appearing renal cysts are noted bilaterally (No followup imaging is recommended). No evidence of hydronephrosis. Stomach/Bowel: Unremarkable. Vascular/Lymphatic: No pathologically enlarged lymph nodes identified. No acute vascular findings. Other:  None. Musculoskeletal:  No suspicious bone lesions identified. IMPRESSION: Mild diffuse biliary ductal dilatation. No evidence of choledocholithiasis, biliary stricture, or other significant abnormality. Electronically Signed   By: Marlyce Sine M.D.   On: 11/24/2023 22:36    Assessment & Plan:   Problem List Items Addressed This Visit     Hypothyroidism - Primary   Cont on Levothroid      Relevant Medications   oxyCODONE  (OXYCONTIN ) 40 mg 12 hr tablet   oxyCODONE  (OXYCONTIN ) 80 mg 12 hr tablet   oxyCODONE  (OXYCONTIN ) 80 mg 12 hr tablet   oxyCODONE  (OXYCONTIN ) 40 mg 12 hr tablet   oxyCODONE  (OXYCONTIN ) 80 mg 12 hr tablet   Depression   Doing well      Low back pain   Chronic Continue with oxycodone /Oxycontin  Rx - long term  Potential benefits of a long term opioids use as well as potential risks (i.e. addiction risk, apnea etc) and complications (i.e. Somnolence, constipation and others) were explained to the patient and were aknowledged.      Relevant Medications   oxyCODONE  (OXYCONTIN ) 40 mg 12 hr tablet   oxyCODONE  (OXYCONTIN ) 40 mg 12 hr tablet   oxyCODONE  (OXYCONTIN ) 80 mg 12 hr tablet   oxyCODONE  (OXYCONTIN ) 80 mg 12 hr tablet   oxyCODONE  (OXYCONTIN ) 40 mg 12 hr tablet   oxyCODONE  (OXYCONTIN ) 80 mg 12 hr tablet   Benign lipomatous neoplasm of skin and subcutaneous tissue of left leg   Relevant Medications   oxyCODONE   (OXYCONTIN ) 40 mg 12 hr tablet   oxyCODONE  (OXYCONTIN ) 80 mg 12 hr tablet   oxyCODONE  (OXYCONTIN ) 80 mg 12 hr tablet      Meds ordered this encounter  Medications   oxyCODONE  (OXYCONTIN ) 40 mg 12 hr tablet    Sig: Take 1 tablet (40 mg total) by mouth every 6 (six) hours.    Dispense:  120 tablet    Refill:  0    Please fill on or after 03/11/24   oxyCODONE  (OXYCONTIN ) 40 mg 12 hr tablet    Sig: Take 1 tablet (40 mg total) by mouth every 6 (six) hours.    Dispense:  120 tablet    Refill:  0    Dx: M54.5 and G89.4 Please fill on or after 02/10/24   oxyCODONE  (OXYCONTIN ) 80 mg 12 hr tablet    Sig: Take 1 tablet (80 mg total) by mouth every 6 (six) hours.    Dispense:  120 tablet    Refill:  0    Dx: M54.5 and G89.4 Please fill on or after 03/11/24   oxyCODONE  (OXYCONTIN ) 80 mg 12 hr tablet    Sig: Take 1 tablet (80 mg total) by mouth every 6 (six) hours.    Dispense:  120 tablet    Refill:  0    Please fill on or after 02/10/24   oxyCODONE  (OXYCONTIN ) 40 mg 12 hr tablet    Sig: Take 1 tablet (40 mg total) by mouth every 6 (six) hours.    Dispense:  120 tablet    Refill:  0    Please fill on or after 04/10/24   Dx: M54.5 and G89.4   oxyCODONE  (OXYCONTIN ) 80 mg 12 hr tablet    Sig: Take 1 tablet (80 mg total) by mouth every 6 (six) hours.    Dispense:  120 tablet    Refill:  0    Please fill on or after 04/10/24. Dx: M54.5 and G89.4      Follow-up: Return in about 3 months (around 05/06/2024) for a follow-up visit.  Anitra Barn, MD

## 2024-02-04 NOTE — Assessment & Plan Note (Signed)
 Doing well

## 2024-02-04 NOTE — Assessment & Plan Note (Signed)
Cont on Levothroid 

## 2024-02-10 DIAGNOSIS — R0602 Shortness of breath: Secondary | ICD-10-CM | POA: Diagnosis not present

## 2024-02-10 DIAGNOSIS — I10 Essential (primary) hypertension: Secondary | ICD-10-CM | POA: Diagnosis not present

## 2024-02-10 DIAGNOSIS — E785 Hyperlipidemia, unspecified: Secondary | ICD-10-CM | POA: Diagnosis not present

## 2024-02-10 DIAGNOSIS — Z720 Tobacco use: Secondary | ICD-10-CM | POA: Diagnosis not present

## 2024-02-10 DIAGNOSIS — I493 Ventricular premature depolarization: Secondary | ICD-10-CM | POA: Diagnosis not present

## 2024-02-10 DIAGNOSIS — I2510 Atherosclerotic heart disease of native coronary artery without angina pectoris: Secondary | ICD-10-CM | POA: Diagnosis not present

## 2024-02-22 ENCOUNTER — Other Ambulatory Visit: Payer: Self-pay | Admitting: Cardiology

## 2024-02-22 DIAGNOSIS — I2583 Coronary atherosclerosis due to lipid rich plaque: Secondary | ICD-10-CM

## 2024-03-02 ENCOUNTER — Ambulatory Visit: Admitting: Internal Medicine

## 2024-03-02 ENCOUNTER — Encounter: Payer: Self-pay | Admitting: Internal Medicine

## 2024-03-02 ENCOUNTER — Ambulatory Visit: Payer: Self-pay | Admitting: *Deleted

## 2024-03-02 VITALS — BP 110/78 | HR 77 | Temp 98.5°F | Ht 65.0 in | Wt 129.0 lb

## 2024-03-02 DIAGNOSIS — M79631 Pain in right forearm: Secondary | ICD-10-CM

## 2024-03-02 NOTE — Telephone Encounter (Signed)
 Copied from CRM [id number]. Topic: Clinical - Red Word Triage >> Mar 02, 2024 10:18 AM Adonis Hoot wrote: Red Word that prompted transfer to Nurse Triage: swelling and bruising in right arm Reason for Disposition  [1] After 3 days AND [2] pain not improving  Answer Assessment - Initial Assessment Questions 1. MECHANISM: How did the injury happen?     My right wrist is swollen.   I've been doing a lot of painting and scrubbing.   Monday when I woke up it was sore.   Tues. It was swollen and bruising from base of my hand to 6 inches up my arm and swollen.   2. ONSET: When did the injury happen? (Minutes or hours ago)      Monday 3. LOCATION: Where is the injury located? Which arm?     Right wrist. 4. APPEARANCE of INJURY: What does the injury look like?      It's swollen and bruised 5. SEVERITY: Can you use the arm normally?      I  don't have much strength on index and middle finger of right hand.   6. SWELLING or BRUISING: is there any swelling or bruising? If Yes, ask: How large is it? (e.g., inches, centimeters)      Bruising and swelling  7. PAIN: Is there pain? If Yes, ask: How bad is the pain?    (Scale 1-10; or mild, moderate, severe)   - NONE (0): No pain.   - MILD (1-3): Doesn't interfere with normal activities.   - MODERATE (4-7): Interferes with normal activities (e.g., work or school) or awakens from sleep.   - SEVERE (8-10): Excruciating pain, unable to do any normal activities, unable to hold a cup of water.     Yes 8. TETANUS: For any breaks in the skin, ask: When was the last tetanus booster?     N/A 9. OTHER SYMPTOMS: Do you have any other symptoms?  (e.g., numbness in hand)     See above 10. PREGNANCY: Is there any chance you are pregnant? When was your last menstrual period?       N/A due to age  Protocols used: Arm Injury-A-AH FYI Only or Action Required?: FYI only for provider  Patient was last seen in primary care on 02/04/2024 by Plotnikov,  Oakley Bellman, MD. Called Nurse Triage reporting Arm Injury. Symptoms began several days ago. Interventions attempted: Nothing. Symptoms are: gradually worsening.  Triage Disposition: See PCP When Office is Open (Within 3 Days)  Patient/caregiver understands and will follow disposition?: Yes

## 2024-03-02 NOTE — Patient Instructions (Signed)
    Make an appointment with sports medicine for your right forearm injury -- pain, swelling, bruising    Medications changes include :   None    A referral was ordered sports medicine and someone will call you to schedule an appointment.

## 2024-03-02 NOTE — Progress Notes (Signed)
 Subjective:    Patient ID: Abigail Wilson, female    DOB: 1961-06-19, 63 y.o.   MRN: [id number]      HPI Miquela is here for  Chief Complaint  Patient presents with   Wrist Pain    Right wrist and fore-arm pain and bruising    She has been doing lots of painting over the past 7 weeks.  2 days ago her right forearm felt sore.  She figured she just strained it or overdid it with everything she was doing.  She denies any injury.  Yesterday she woke up and the distal forearm was painful, swollen and bruised.  She has noticed that her index and middle finger are weak.  She does have increased pain with certain movements of the wrist/thumb.  She denies any numbness or tingling.  The bruising has gotten larger.  She has not taken anything for the pain.    Medications and allergies reviewed with patient and updated if appropriate.  Current Outpatient Medications on File Prior to Visit  Medication Sig Dispense Refill   aspirin 81 MG EC tablet Take 81 mg by mouth daily.     B Complex-C (SUPER B COMPLEX PO) Take by mouth.     Cholecalciferol 3000 units TABS Take 2 tablets by mouth daily.     COENZYME Q-10 PO Take 200 mg by mouth daily.     docusate sodium (COLACE) 100 MG capsule Take 100 mg by mouth 2 (two) times daily.     fluticasone  (FLONASE ) 50 MCG/ACT nasal spray shake liquid & use 2 sprays in each nostril daily 16 g 5   furosemide  (LASIX ) 40 MG tablet Take 1 tablet (40 mg total) by mouth daily. 90 tablet 1   hyoscyamine  (LEVSIN ) 0.125 MG tablet Take 1-2 tablets (0.125-0.25 mg total) by mouth every 6 (six) hours as needed for cramping. 100 tablet 1   levothyroxine  (SYNTHROID ) 100 MCG tablet Take 1 tablet (100 mcg total) by mouth daily. 90 tablet 3   naloxone  (NARCAN ) nasal spray 4 mg/0.1 mL 1 actuation in one nostril once. May repeat in 2-3 min 1 each 2   oxyCODONE  (OXYCONTIN ) 40 mg 12 hr tablet Take 1 tablet (40 mg total) by mouth every 6 (six) hours. 120 tablet 0   oxyCODONE   (OXYCONTIN ) 40 mg 12 hr tablet Take 1 tablet (40 mg total) by mouth every 6 (six) hours. 120 tablet 0   oxyCODONE  (OXYCONTIN ) 40 mg 12 hr tablet Take 1 tablet (40 mg total) by mouth every 6 (six) hours. 120 tablet 0   oxyCODONE  (OXYCONTIN ) 80 mg 12 hr tablet Take 1 tablet (80 mg total) by mouth every 6 (six) hours. 120 tablet 0   oxyCODONE  (OXYCONTIN ) 80 mg 12 hr tablet Take 1 tablet (80 mg total) by mouth every 6 (six) hours. 120 tablet 0   oxyCODONE  (OXYCONTIN ) 80 mg 12 hr tablet Take 1 tablet (80 mg total) by mouth every 6 (six) hours. 120 tablet 0   potassium chloride  SA (KLOR-CON  M) 20 MEQ tablet TAKE ONE TABLET BY MOUTH DAILY 90 tablet 3   rosuvastatin  (CRESTOR ) 20 MG tablet TAKE ONE TABLET BY MOUTH EVERY DAY 90 tablet 0   Sennosides (SENNA LAX PO) Take by mouth.     No current facility-administered medications on file prior to visit.    Review of Systems     Objective:   Vitals:   03/02/24 1559  BP: 110/78  Pulse: 77  Temp: 98.5 F (36.9 C)  SpO2: 94%   BP Readings from Last 3 Encounters:  03/02/24 110/78  02/04/24 122/80  11/06/23 104/68   Wt Readings from Last 3 Encounters:  03/02/24 129 lb (58.5 kg)  02/04/24 130 lb 6.4 oz (59.1 kg)  01/18/24 141 lb (64 kg)   Body mass index is 21.47 kg/m.    Physical Exam Constitutional:      General: She is not in acute distress.    Appearance: Normal appearance. She is not ill-appearing.  HENT:     Head: Normocephalic and atraumatic.   Musculoskeletal:     Comments: Right distal anterior forearm from wrist to proximal forearm is deep bruise with slight swelling and tenderness and mild warmth.  Areas of bruising on medial aspect of distal forearm and extending up towards thumb..  Some tenderness along the anterior aspect of the wrist.  Neurovascularly in tact-palpable radial and ulnar pulses.  Normal sensation   Skin:    General: Skin is warm and dry.     Findings: Bruising present.   Neurological:     Mental Status:  She is alert.            Assessment & Plan:    Right forearm pain: Acute Possible muscle or ligament injury/tear No obvious injury, but she has been doing a lot of painting for weeks Has significant bruising, slight tenderness and swelling Index and middle finger slightly weak which could be from compression of the median nerve Likely soft tissue injury-referral to sports medicine

## 2024-03-03 NOTE — Progress Notes (Signed)
    Ben  D.[facility] [address] [place] [address] Phone: [phone]   Assessment and Plan:     There are no diagnoses linked to this encounter.  ***   Pertinent previous records reviewed include ***    Follow Up: ***     Subjective:   I, Kenesha Moshier, am serving as a Neurosurgeon for Doctor Ulysees Gander  Chief Complaint: right wrist pain   HPI:   03/04/2024 Patient is a 63 year old female with right wrist pain. Patient states  Relevant Historical Information: ***  Additional pertinent review of systems negative.   Current Outpatient Medications:    aspirin 81 MG EC tablet, Take 81 mg by mouth daily., Disp: , Rfl:    B Complex-C (SUPER B COMPLEX PO), Take by mouth., Disp: , Rfl:    Cholecalciferol 3000 units TABS, Take 2 tablets by mouth daily., Disp: , Rfl:    COENZYME Q-10 PO, Take 200 mg by mouth daily., Disp: , Rfl:    docusate sodium (COLACE) 100 MG capsule, Take 100 mg by mouth 2 (two) times daily., Disp: , Rfl:    fluticasone  (FLONASE ) 50 MCG/ACT nasal spray, shake liquid & use 2 sprays in each nostril daily, Disp: 16 g, Rfl: 5   furosemide  (LASIX ) 40 MG tablet, Take 1 tablet (40 mg total) by mouth daily., Disp: 90 tablet, Rfl: 1   hyoscyamine  (LEVSIN ) 0.125 MG tablet, Take 1-2 tablets (0.125-0.25 mg total) by mouth every 6 (six) hours as needed for cramping., Disp: 100 tablet, Rfl: 1   levothyroxine  (SYNTHROID ) 100 MCG tablet, Take 1 tablet (100 mcg total) by mouth daily., Disp: 90 tablet, Rfl: 3   naloxone  (NARCAN ) nasal spray 4 mg/0.1 mL, 1 actuation in one nostril once. May repeat in 2-3 min, Disp: 1 each, Rfl: 2   oxyCODONE  (OXYCONTIN ) 40 mg 12 hr tablet, Take 1 tablet (40 mg total) by mouth every 6 (six) hours., Disp: 120 tablet, Rfl: 0   oxyCODONE  (OXYCONTIN ) 40 mg 12 hr tablet, Take 1 tablet (40 mg total) by mouth every 6 (six) hours., Disp: 120 tablet, Rfl: 0   oxyCODONE  (OXYCONTIN ) 40 mg 12 hr tablet,  Take 1 tablet (40 mg total) by mouth every 6 (six) hours., Disp: 120 tablet, Rfl: 0   oxyCODONE  (OXYCONTIN ) 80 mg 12 hr tablet, Take 1 tablet (80 mg total) by mouth every 6 (six) hours., Disp: 120 tablet, Rfl: 0   oxyCODONE  (OXYCONTIN ) 80 mg 12 hr tablet, Take 1 tablet (80 mg total) by mouth every 6 (six) hours., Disp: 120 tablet, Rfl: 0   oxyCODONE  (OXYCONTIN ) 80 mg 12 hr tablet, Take 1 tablet (80 mg total) by mouth every 6 (six) hours., Disp: 120 tablet, Rfl: 0   potassium chloride  SA (KLOR-CON  M) 20 MEQ tablet, TAKE ONE TABLET BY MOUTH DAILY, Disp: 90 tablet, Rfl: 3   rosuvastatin  (CRESTOR ) 20 MG tablet, TAKE ONE TABLET BY MOUTH EVERY DAY, Disp: 90 tablet, Rfl: 0   Sennosides (SENNA LAX PO), Take by mouth., Disp: , Rfl:    Objective:     There were no vitals filed for this visit.    There is no height or weight on file to calculate BMI.    Physical Exam:    ***   Electronically signed by:  Marshall Skeeter D[facility] 7:37 AM 03/03/24

## 2024-03-04 ENCOUNTER — Ambulatory Visit: Admitting: Sports Medicine

## 2024-03-04 ENCOUNTER — Other Ambulatory Visit: Payer: Self-pay

## 2024-03-04 VITALS — BP 122/70 | HR 68 | Ht 65.0 in | Wt 131.8 lb

## 2024-03-04 DIAGNOSIS — M25531 Pain in right wrist: Secondary | ICD-10-CM | POA: Diagnosis not present

## 2024-03-04 NOTE — Patient Instructions (Addendum)
 Wrist Hep. Tylenol and ice as needed. No restrictions. Follow up as needed.

## 2024-04-04 ENCOUNTER — Telehealth: Payer: Self-pay

## 2024-04-04 NOTE — Telephone Encounter (Unsigned)
 Copied from CRM [id number]. Topic: Clinical - Prescription Issue >> Apr 04, 2024  4:02 PM [place] J wrote: Reason for CRM: Patient's oxyCODONE  (OXYCONTIN ) 80 mg 12 hr tablet and oxyCODONE  (OXYCONTIN ) 40 mg 12 hr tablet have a do not fill date the day that the pharmacy is closed. She is wondering if she could get a verbal approval stating that it can be filled a day before 04/10/24.  Callback: [phone]

## 2024-04-05 ENCOUNTER — Telehealth: Payer: Self-pay | Admitting: Internal Medicine

## 2024-04-05 NOTE — Telephone Encounter (Unsigned)
 Copied from CRM [id number]. Topic: Clinical - Prescription Issue >> Apr 05, 2024  2:18 PM Gennette ORN wrote: Reason for CRM: Desiree from [facility] her contact number is [phone]. She  needs a follow up by Friday on Patient's oxyCODONE  (OXYCONTIN ) 80 mg 12 hr tablet and oxyCODONE  (OXYCONTIN ) 40 mg 12 hr tablet for refill. The pharmacy is closed on Sunday she wants to fill Saturday.

## 2024-04-06 NOTE — Telephone Encounter (Signed)
 Okay.  Thanks.

## 2024-04-08 NOTE — Telephone Encounter (Signed)
 Spoke with Abigail Wilson at [facility]... And was able to discuss concern and clarify them.

## 2024-04-25 ENCOUNTER — Other Ambulatory Visit: Payer: Self-pay | Admitting: Internal Medicine

## 2024-05-04 ENCOUNTER — Encounter: Payer: Self-pay | Admitting: Internal Medicine

## 2024-05-04 ENCOUNTER — Ambulatory Visit: Admitting: Internal Medicine

## 2024-05-04 VITALS — BP 116/75 | HR 63 | Temp 98.4°F | Ht 65.0 in | Wt 127.0 lb

## 2024-05-04 DIAGNOSIS — M545 Low back pain, unspecified: Secondary | ICD-10-CM

## 2024-05-04 DIAGNOSIS — E034 Atrophy of thyroid (acquired): Secondary | ICD-10-CM | POA: Diagnosis not present

## 2024-05-04 DIAGNOSIS — J449 Chronic obstructive pulmonary disease, unspecified: Secondary | ICD-10-CM

## 2024-05-04 DIAGNOSIS — E042 Nontoxic multinodular goiter: Secondary | ICD-10-CM | POA: Diagnosis not present

## 2024-05-04 DIAGNOSIS — I2583 Coronary atherosclerosis due to lipid rich plaque: Secondary | ICD-10-CM

## 2024-05-04 DIAGNOSIS — D1724 Benign lipomatous neoplasm of skin and subcutaneous tissue of left leg: Secondary | ICD-10-CM

## 2024-05-04 DIAGNOSIS — G8929 Other chronic pain: Secondary | ICD-10-CM

## 2024-05-04 MED ORDER — OXYCODONE HCL ER 40 MG PO T12A
40.0000 mg | EXTENDED_RELEASE_TABLET | Freq: Four times a day (QID) | ORAL | 0 refills | Status: DC
Start: 2024-05-04 — End: 2024-08-04

## 2024-05-04 MED ORDER — OXYCODONE HCL ER 40 MG PO T12A: 40.0000 mg | EXTENDED_RELEASE_TABLET | Freq: Four times a day (QID) | ORAL | 0 refills | Status: DC

## 2024-05-04 MED ORDER — OXYCODONE HCL ER 80 MG PO T12A: 80.0000 mg | EXTENDED_RELEASE_TABLET | Freq: Four times a day (QID) | ORAL | 0 refills | Status: DC

## 2024-05-04 NOTE — Assessment & Plan Note (Signed)
 Doing well

## 2024-05-04 NOTE — Progress Notes (Signed)
 Subjective:  Patient ID: Abigail Wilson, female    DOB: [birth year]  Age: 63 y.o. MRN: [id number]  CC: Medical Management of Chronic Issues (3 mnth f/ u, Asthma )   HPI Abigail Wilson presents for chronic pain, hypothyroidism, asthma  Outpatient Medications Prior to Visit  Medication Sig Dispense Refill   aspirin 81 MG EC tablet Take 81 mg by mouth daily.     B Complex-C (SUPER B COMPLEX PO) Take by mouth.     Cholecalciferol 3000 units TABS Take 2 tablets by mouth daily.     COENZYME Q-10 PO Take 200 mg by mouth daily.     docusate sodium (COLACE) 100 MG capsule Take 100 mg by mouth 2 (two) times daily.     fluticasone  (FLONASE ) 50 MCG/ACT nasal spray shake liquid & use 2 sprays in each nostril daily 16 g 5   furosemide  (LASIX ) 40 MG tablet Take 1 tablet (40 mg total) by mouth daily. 90 tablet 1   hyoscyamine  (LEVSIN ) 0.125 MG tablet Take 1-2 tablets (0.125-0.25 mg total) by mouth every 6 (six) hours as needed for cramping. 100 tablet 1   levothyroxine  (SYNTHROID ) 100 MCG tablet Take 1 tablet (100 mcg total) by mouth daily. 90 tablet 3   naloxone  (NARCAN ) nasal spray 4 mg/0.1 mL 1 actuation in one nostril once. May repeat in 2-3 min 1 each 2   oxyCODONE  (OXYCONTIN ) 40 mg 12 hr tablet Take 1 tablet (40 mg total) by mouth every 6 (six) hours. 120 tablet 0   potassium chloride  SA (KLOR-CON  M) 20 MEQ tablet TAKE ONE TABLET BY MOUTH DAILY 90 tablet 3   rosuvastatin  (CRESTOR ) 20 MG tablet TAKE ONE TABLET BY MOUTH EVERY DAY 90 tablet 0   Sennosides (SENNA LAX PO) Take by mouth.     oxyCODONE  (OXYCONTIN ) 40 mg 12 hr tablet Take 1 tablet (40 mg total) by mouth every 6 (six) hours. 120 tablet 0   oxyCODONE  (OXYCONTIN ) 40 mg 12 hr tablet Take 1 tablet (40 mg total) by mouth every 6 (six) hours. 120 tablet 0   oxyCODONE  (OXYCONTIN ) 80 mg 12 hr tablet Take 1 tablet (80 mg total) by mouth every 6 (six) hours. 120 tablet 0   oxyCODONE  (OXYCONTIN ) 80 mg 12 hr tablet Take 1 tablet (80 mg total)  by mouth every 6 (six) hours. 120 tablet 0   oxyCODONE  (OXYCONTIN ) 80 mg 12 hr tablet Take 1 tablet (80 mg total) by mouth every 6 (six) hours. 120 tablet 0   No facility-administered medications prior to visit.    ROS: Review of Systems  Constitutional:  Negative for activity change, appetite change, chills, fatigue and unexpected weight change.  HENT:  Negative for congestion, mouth sores and sinus pressure.   Eyes:  Negative for visual disturbance.  Respiratory:  Negative for cough and chest tightness.   Gastrointestinal:  Negative for abdominal pain and nausea.  Genitourinary:  Negative for difficulty urinating, frequency and vaginal pain.  Musculoskeletal:  Positive for arthralgias and back pain. Negative for gait problem.  Skin:  Negative for pallor and rash.  Neurological:  Negative for dizziness, tremors, weakness, numbness and headaches.  Psychiatric/Behavioral:  Negative for confusion and sleep disturbance.     Objective:  BP 116/75   Pulse 63   Temp 98.4 F (36.9 C) (Oral)   Ht 5' 5 (1.651 m)   Wt 127 lb (57.6 kg)   SpO2 99%   BMI 21.13 kg/m   BP Readings from Last 3  Encounters:  05/04/24 116/75  03/04/24 122/70  03/02/24 110/78    Wt Readings from Last 3 Encounters:  05/04/24 127 lb (57.6 kg)  03/04/24 131 lb 12.8 oz (59.8 kg)  03/02/24 129 lb (58.5 kg)    Physical Exam Constitutional:      General: She is not in acute distress.    Appearance: Normal appearance. She is well-developed.  HENT:     Head: Normocephalic.     Right Ear: External ear normal.     Left Ear: External ear normal.     Nose: Nose normal.  Eyes:     General:        Right eye: No discharge.        Left eye: No discharge.     Conjunctiva/sclera: Conjunctivae normal.     Pupils: Pupils are equal, round, and reactive to light.  Neck:     Thyroid : No thyromegaly.     Vascular: No JVD.     Trachea: No tracheal deviation.  Cardiovascular:     Rate and Rhythm: Normal rate and  regular rhythm.     Heart sounds: Normal heart sounds.  Pulmonary:     Effort: No respiratory distress.     Breath sounds: No stridor. No wheezing.  Abdominal:     General: Bowel sounds are normal. There is no distension.     Palpations: Abdomen is soft. There is no mass.     Tenderness: There is no abdominal tenderness. There is no guarding or rebound.  Musculoskeletal:        General: Tenderness present.     Cervical back: Normal range of motion and neck supple. No rigidity.     Right lower leg: No edema.     Left lower leg: No edema.  Lymphadenopathy:     Cervical: No cervical adenopathy.  Skin:    Findings: No erythema or rash.  Neurological:     Cranial Nerves: No cranial nerve deficit.     Motor: No abnormal muscle tone.     Coordination: Coordination normal.     Deep Tendon Reflexes: Reflexes normal.  Psychiatric:        Behavior: Behavior normal.        Thought Content: Thought content normal.        Judgment: Judgment normal.    LS Spine w/pain  Lab Results  Component Value Date   WBC 5.4 10/15/2023   HGB 13.2 10/15/2023   HCT 39.7 10/15/2023   PLT 240.0 10/15/2023   GLUCOSE 88 10/15/2023   CHOL 184 08/05/2023   TRIG 52 08/05/2023   HDL 107 08/05/2023   LDLCALC 67 08/05/2023   ALT 19 10/15/2023   AST 28 10/15/2023   NA 137 10/15/2023   K 4.1 10/15/2023   CL 99 10/15/2023   CREATININE 0.69 10/15/2023   BUN 19 10/15/2023   CO2 30 10/15/2023   TSH 4.21 02/03/2023    MR ABDOMEN MRCP W WO CONTAST Result Date: 11/24/2023 CLINICAL DATA:  Abdominal pain. Biliary ductal dilatation on recent CT. EXAM: MRI ABDOMEN WITHOUT AND WITH CONTRAST (INCLUDING MRCP) TECHNIQUE: Multiplanar multisequence MR imaging of the abdomen was performed both before and after the administration of intravenous contrast. Heavily T2-weighted images of the biliary and pancreatic ducts were obtained, and three-dimensional MRCP images were rendered by post processing. CONTRAST:  6 mL Vueway   COMPARISON:  CT on 11/02/2023 FINDINGS: Lower chest: No acute findings. Hepatobiliary: No hepatic masses identified. Tiny sub-cm cyst noted in segment 4B of the  left lobe. Gallbladder is unremarkable. Mild diffuse biliary ductal dilatation is seen with common bile duct measuring 13 mm in diameter. No No evidence of choledocholithiasis or biliary stricture. Pancreas: No mass or inflammatory changes. No evidence of pancreatic ductal dilatation or pancreas divisum. Spleen:  Within normal limits in size and appearance. Adrenals/Urinary Tract: No suspicious masses identified. Small benign-appearing renal cysts are noted bilaterally (No followup imaging is recommended). No evidence of hydronephrosis. Stomach/Bowel: Unremarkable. Vascular/Lymphatic: No pathologically enlarged lymph nodes identified. No acute vascular findings. Other:  None. Musculoskeletal:  No suspicious bone lesions identified. IMPRESSION: Mild diffuse biliary ductal dilatation. No evidence of choledocholithiasis, biliary stricture, or other significant abnormality. Electronically Signed   By: Norleen DELENA Kil M.D.   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   Relevant Medications   oxyCODONE  (OXYCONTIN ) 80 mg 12 hr tablet   oxyCODONE  (OXYCONTIN ) 40 mg 12 hr tablet   oxyCODONE  (OXYCONTIN ) 80 mg 12 hr tablet   COPD mixed type (HCC)   Doing well      Coronary atherosclerosis   F/u w/Dr Pietro On crestor  2024, [facility]      GOITER, MULTINODULAR   Pt was scheduled for FNA, but was told no need by Radiology We reviewed Neck US  and Thyroid  US       Hypothyroidism   Relevant Medications   oxyCODONE  (OXYCONTIN ) 80 mg 12 hr tablet   oxyCODONE  (OXYCONTIN ) 40 mg 12 hr tablet   oxyCODONE  (OXYCONTIN ) 80 mg 12 hr tablet   oxyCODONE  (OXYCONTIN ) 80 mg 12 hr tablet   Low back pain - Primary   Chronic Continue with  oxycodone /Oxycontin  Rx - long term  Potential benefits of a long term opioids use as well as potential risks (i.e. addiction risk, apnea etc) and complications (i.e. Somnolence, constipation and others) were explained to the patient and were aknowledged.      Relevant Medications   oxyCODONE  (OXYCONTIN ) 80 mg 12 hr tablet   oxyCODONE  (OXYCONTIN ) 40 mg 12 hr tablet   oxyCODONE  (OXYCONTIN ) 40 mg 12 hr tablet   oxyCODONE  (OXYCONTIN ) 80 mg 12 hr tablet   oxyCODONE  (OXYCONTIN ) 80 mg 12 hr tablet      Meds ordered this encounter  Medications   DISCONTD: oxyCODONE  (OXYCONTIN ) 40 mg 12 hr tablet    Sig: Take 1 tablet (40 mg total) by mouth every 6 (six) hours.    Dispense:  120 tablet    Refill:  0    Dx: M54.5 and G89.4 Please fill on or after 04/10/24   oxyCODONE  (OXYCONTIN ) 80 mg 12 hr tablet    Sig: Take 1 tablet (80 mg total) by mouth every 6 (six) hours.    Dispense:  120 tablet    Refill:  0    Dx: M54.5 and G89.4 Please fill on or after 04/10/24   oxyCODONE  (OXYCONTIN ) 40 mg 12 hr tablet    Sig: Take 1 tablet (40 mg total) by mouth every 6 (six) hours.    Dispense:  120 tablet    Refill:  0    Dx: M54.5 and G89.4 Please fill on or after 06/03/24   oxyCODONE  (OXYCONTIN ) 40 mg 12 hr tablet    Sig: Take 1 tablet (40 mg total) by mouth every 6 (six) hours.    Dispense:  120 tablet    Refill:  0    Dx: M54.5  and G89.4 Please fill on or after 07/03/24   oxyCODONE  (OXYCONTIN ) 80 mg 12 hr tablet    Sig: Take 1 tablet (80 mg total) by mouth every 6 (six) hours.    Dispense:  120 tablet    Refill:  0    Dx: M54.5 and G89.4 Please fill on or after 06/03/24   oxyCODONE  (OXYCONTIN ) 80 mg 12 hr tablet    Sig: Take 1 tablet (80 mg total) by mouth every 6 (six) hours.    Dispense:  120 tablet    Refill:  0    Dx: M54.5 and G89.4 Please fill on or after 07/03/24      Follow-up: Return in about 3 months (around 08/04/2024) for a follow-up visit. Chief complaint  Marolyn Noel, MD

## 2024-05-04 NOTE — Assessment & Plan Note (Signed)
 F/u w/Dr Pietro On crestor  2024, [facility]

## 2024-05-04 NOTE — Assessment & Plan Note (Signed)
Chronic Continue with oxycodone/Oxycontin Rx - long term  Potential benefits of a long term opioids use as well as potential risks (i.e. addiction risk, apnea etc) and complications (i.e. Somnolence, constipation and others) were explained to the patient and were aknowledged. 

## 2024-05-04 NOTE — Assessment & Plan Note (Signed)
Pt was scheduled for FNA, but was told "no need" by Radiology We reviewed Neck US and Thyroid US

## 2024-05-23 ENCOUNTER — Other Ambulatory Visit: Payer: Self-pay | Admitting: Internal Medicine

## 2024-05-23 DIAGNOSIS — H25813 Combined forms of age-related cataract, bilateral: Secondary | ICD-10-CM | POA: Diagnosis not present

## 2024-05-23 DIAGNOSIS — H04123 Dry eye syndrome of bilateral lacrimal glands: Secondary | ICD-10-CM | POA: Diagnosis not present

## 2024-05-23 DIAGNOSIS — E876 Hypokalemia: Secondary | ICD-10-CM

## 2024-06-16 ENCOUNTER — Other Ambulatory Visit: Payer: Self-pay | Admitting: Internal Medicine

## 2024-07-20 ENCOUNTER — Other Ambulatory Visit: Payer: Self-pay | Admitting: Internal Medicine

## 2024-08-04 ENCOUNTER — Ambulatory Visit (INDEPENDENT_AMBULATORY_CARE_PROVIDER_SITE_OTHER): Admitting: Internal Medicine

## 2024-08-04 ENCOUNTER — Encounter: Payer: Self-pay | Admitting: Internal Medicine

## 2024-08-04 VITALS — BP 124/78 | HR 63 | Temp 98.3°F | Ht 65.0 in | Wt 124.8 lb

## 2024-08-04 DIAGNOSIS — Z23 Encounter for immunization: Secondary | ICD-10-CM | POA: Diagnosis not present

## 2024-08-04 DIAGNOSIS — E034 Atrophy of thyroid (acquired): Secondary | ICD-10-CM

## 2024-08-04 DIAGNOSIS — D1724 Benign lipomatous neoplasm of skin and subcutaneous tissue of left leg: Secondary | ICD-10-CM | POA: Diagnosis not present

## 2024-08-04 DIAGNOSIS — G8929 Other chronic pain: Secondary | ICD-10-CM

## 2024-08-04 DIAGNOSIS — M5440 Lumbago with sciatica, unspecified side: Secondary | ICD-10-CM | POA: Diagnosis not present

## 2024-08-04 DIAGNOSIS — M545 Low back pain, unspecified: Secondary | ICD-10-CM

## 2024-08-04 MED ORDER — OXYCODONE HCL ER 80 MG PO T12A: 80.0000 mg | EXTENDED_RELEASE_TABLET | Freq: Four times a day (QID) | ORAL | 0 refills | Status: AC

## 2024-08-04 MED ORDER — OXYCODONE HCL ER 40 MG PO T12A: 40.0000 mg | EXTENDED_RELEASE_TABLET | Freq: Four times a day (QID) | ORAL | 0 refills | Status: AC

## 2024-08-04 NOTE — Progress Notes (Signed)
 Subjective:  Patient ID: Abigail Wilson, female    DOB: 08-28-1961  Age: 63 y.o. MRN: [id number]  CC: Medical Management of Chronic Issues (3 Month follow up)   HPI SYLVA OVERLEY presents for chronic pain, hypothyroidism, dyslipidemia  Outpatient Medications Prior to Visit  Medication Sig Dispense Refill   aspirin 81 MG EC tablet Take 81 mg by mouth daily.     B Complex-C (SUPER B COMPLEX PO) Take by mouth.     Cholecalciferol 3000 units TABS Take 2 tablets by mouth daily.     COENZYME Q-10 PO Take 200 mg by mouth daily.     docusate sodium (COLACE) 100 MG capsule Take 100 mg by mouth 2 (two) times daily.     fluticasone  (FLONASE ) 50 MCG/ACT nasal spray shake liquid & use 2 sprays in each nostril daily 16 g 5   furosemide  (LASIX ) 40 MG tablet Take 1 tablet (40 mg total) by mouth daily. 90 tablet 1   hyoscyamine  (LEVSIN ) 0.125 MG tablet Take 1-2 tablets (0.125-0.25 mg total) by mouth every 6 (six) hours as needed for cramping. 100 tablet 1   levothyroxine  (SYNTHROID ) 100 MCG tablet Take 1 tablet (100 mcg total) by mouth daily. 90 tablet 3   naloxone  (NARCAN ) nasal spray 4 mg/0.1 mL 1 actuation in one nostril once. May repeat in 2-3 min 1 each 2   potassium chloride  SA (KLOR-CON  M) 20 MEQ tablet TAKE ONE TABLET BY MOUTH DAILY 90 tablet 3   rosuvastatin  (CRESTOR ) 20 MG tablet TAKE ONE TABLET BY MOUTH EVERY DAY 90 tablet 0   Sennosides (SENNA LAX PO) Take by mouth.     oxyCODONE  (OXYCONTIN ) 40 mg 12 hr tablet Take 1 tablet (40 mg total) by mouth every 6 (six) hours. 120 tablet 0   oxyCODONE  (OXYCONTIN ) 40 mg 12 hr tablet Take 1 tablet (40 mg total) by mouth every 6 (six) hours. 120 tablet 0   oxyCODONE  (OXYCONTIN ) 40 mg 12 hr tablet Take 1 tablet (40 mg total) by mouth every 6 (six) hours. 120 tablet 0   oxyCODONE  (OXYCONTIN ) 80 mg 12 hr tablet Take 1 tablet (80 mg total) by mouth every 6 (six) hours. 120 tablet 0   oxyCODONE  (OXYCONTIN ) 80 mg 12 hr tablet Take 1 tablet (80 mg  total) by mouth every 6 (six) hours. 120 tablet 0   oxyCODONE  (OXYCONTIN ) 80 mg 12 hr tablet Take 1 tablet (80 mg total) by mouth every 6 (six) hours. 120 tablet 0   No facility-administered medications prior to visit.    ROS: Review of Systems  Constitutional:  Negative for activity change, appetite change, chills, fatigue and unexpected weight change.  HENT:  Negative for congestion, mouth sores and sinus pressure.   Eyes:  Negative for visual disturbance.  Respiratory:  Negative for cough and chest tightness.   Gastrointestinal:  Negative for abdominal pain and nausea.  Genitourinary:  Negative for difficulty urinating, frequency and vaginal pain.  Musculoskeletal:  Positive for back pain. Negative for gait problem.  Skin:  Negative for pallor and rash.  Neurological:  Negative for dizziness, tremors, weakness, numbness and headaches.  Psychiatric/Behavioral:  Negative for confusion, sleep disturbance and suicidal ideas.     Objective:  BP 124/78   Pulse 63   Temp 98.3 F (36.8 C)   Ht 5' 5 (1.651 m)   Wt 124 lb 12.8 oz (56.6 kg)   SpO2 99%   BMI 20.77 kg/m   BP Readings from Last 3 Encounters:  08/04/24 124/78  05/04/24 116/75  03/04/24 122/70    Wt Readings from Last 3 Encounters:  08/04/24 124 lb 12.8 oz (56.6 kg)  05/04/24 127 lb (57.6 kg)  03/04/24 131 lb 12.8 oz (59.8 kg)    Physical Exam Constitutional:      General: She is not in acute distress.    Appearance: She is well-developed. She is obese.  HENT:     Head: Normocephalic.     Right Ear: External ear normal.     Left Ear: External ear normal.     Nose: Nose normal.  Eyes:     General:        Right eye: No discharge.        Left eye: No discharge.     Conjunctiva/sclera: Conjunctivae normal.     Pupils: Pupils are equal, round, and reactive to light.  Neck:     Thyroid : No thyromegaly.     Vascular: No JVD.     Trachea: No tracheal deviation.  Cardiovascular:     Rate and Rhythm: Normal  rate and regular rhythm.     Heart sounds: Normal heart sounds.  Pulmonary:     Effort: No respiratory distress.     Breath sounds: No stridor. No wheezing.  Abdominal:     General: Bowel sounds are normal. There is no distension.     Palpations: Abdomen is soft. There is no mass.     Tenderness: There is no abdominal tenderness. There is no guarding or rebound.  Musculoskeletal:        General: No tenderness.     Cervical back: Normal range of motion and neck supple. No rigidity.     Right lower leg: Edema present.     Left lower leg: Edema present.  Lymphadenopathy:     Cervical: No cervical adenopathy.  Skin:    Findings: No erythema or rash.  Neurological:     Cranial Nerves: No cranial nerve deficit.     Motor: No abnormal muscle tone.     Coordination: Coordination normal.     Gait: Gait abnormal.     Deep Tendon Reflexes: Reflexes normal.  Psychiatric:        Behavior: Behavior normal.        Thought Content: Thought content normal.        Judgment: Judgment normal.     Lab Results  Component Value Date   WBC 5.4 10/15/2023   HGB 13.2 10/15/2023   HCT 39.7 10/15/2023   PLT 240.0 10/15/2023   GLUCOSE 88 10/15/2023   CHOL 184 08/05/2023   TRIG 52 08/05/2023   HDL 107 08/05/2023   LDLCALC 67 08/05/2023   ALT 19 10/15/2023   AST 28 10/15/2023   NA 137 10/15/2023   K 4.1 10/15/2023   CL 99 10/15/2023   CREATININE 0.69 10/15/2023   BUN 19 10/15/2023   CO2 30 10/15/2023   TSH 4.21 02/03/2023    MR ABDOMEN MRCP W WO CONTAST Result Date: 11/24/2023 CLINICAL DATA:  Abdominal pain. Biliary ductal dilatation on recent CT. EXAM: MRI ABDOMEN WITHOUT AND WITH CONTRAST (INCLUDING MRCP) TECHNIQUE: Multiplanar multisequence MR imaging of the abdomen was performed both before and after the administration of intravenous contrast. Heavily T2-weighted images of the biliary and pancreatic ducts were obtained, and three-dimensional MRCP images were rendered by post processing.  CONTRAST:  6 mL Vueway  COMPARISON:  CT on 11/02/2023 FINDINGS: Lower chest: No acute findings. Hepatobiliary: No hepatic masses identified. Tiny sub-cm cyst noted  in segment 4B of the left lobe. Gallbladder is unremarkable. Mild diffuse biliary ductal dilatation is seen with common bile duct measuring 13 mm in diameter. No No evidence of choledocholithiasis or biliary stricture. Pancreas: No mass or inflammatory changes. No evidence of pancreatic ductal dilatation or pancreas divisum. Spleen:  Within normal limits in size and appearance. Adrenals/Urinary Tract: No suspicious masses identified. Small benign-appearing renal cysts are noted bilaterally (No followup imaging is recommended). No evidence of hydronephrosis. Stomach/Bowel: Unremarkable. Vascular/Lymphatic: No pathologically enlarged lymph nodes identified. No acute vascular findings. Other:  None. Musculoskeletal:  No suspicious bone lesions identified. IMPRESSION: Mild diffuse biliary ductal dilatation. No evidence of choledocholithiasis, biliary stricture, or other significant abnormality. Electronically Signed   By: Norleen DELENA Kil M.D.   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   Relevant Medications   oxyCODONE  (OXYCONTIN ) 40 mg 12 hr tablet   oxyCODONE  (OXYCONTIN ) 80 mg 12 hr tablet   oxyCODONE  (OXYCONTIN ) 80 mg 12 hr tablet   Hypothyroidism   Relevant Medications   oxyCODONE  (OXYCONTIN ) 40 mg 12 hr tablet   oxyCODONE  (OXYCONTIN ) 40 mg 12 hr tablet   oxyCODONE  (OXYCONTIN ) 80 mg 12 hr tablet   oxyCODONE  (OXYCONTIN ) 80 mg 12 hr tablet   oxyCODONE  (OXYCONTIN ) 80 mg 12 hr tablet   Low back pain   Relevant Medications   oxyCODONE  (OXYCONTIN ) 40 mg 12 hr tablet   oxyCODONE  (OXYCONTIN ) 40 mg 12 hr tablet   oxyCODONE  (OXYCONTIN ) 40 mg 12 hr tablet   oxyCODONE  (OXYCONTIN ) 80 mg 12 hr tablet   oxyCODONE  (OXYCONTIN ) 80 mg  12 hr tablet   oxyCODONE  (OXYCONTIN ) 80 mg 12 hr tablet      Meds ordered this encounter  Medications   oxyCODONE  (OXYCONTIN ) 40 mg 12 hr tablet    Sig: Take 1 tablet (40 mg total) by mouth every 6 (six) hours.    Dispense:  120 tablet    Refill:  0    Please fill on or after 08/05/24   Dx: M54.5 and G89.4   oxyCODONE  (OXYCONTIN ) 40 mg 12 hr tablet    Sig: Take 1 tablet (40 mg total) by mouth every 6 (six) hours.    Dispense:  120 tablet    Refill:  0    Please fill on or after 09/04/24   Dx: M54.5 and G89.4   oxyCODONE  (OXYCONTIN ) 40 mg 12 hr tablet    Sig: Take 1 tablet (40 mg total) by mouth every 6 (six) hours.    Dispense:  120 tablet    Refill:  0    Please fill on or after 10/04/24   Dx: M54.5 and G89.4   oxyCODONE  (OXYCONTIN ) 80 mg 12 hr tablet    Sig: Take 1 tablet (80 mg total) by mouth every 6 (six) hours.    Dispense:  120 tablet    Refill:  0    Please fill on or after 08/05/24   Dx: M54.5 and G89.4   oxyCODONE  (OXYCONTIN ) 80 mg 12 hr tablet    Sig: Take 1 tablet (80 mg total) by mouth every 6 (six) hours.    Dispense:  120 tablet    Refill:  0    Please fill on or after 09/04/24   Dx: M54.5 and G89.4   oxyCODONE  (OXYCONTIN ) 80 mg 12 hr tablet    Sig: Take 1  tablet (80 mg total) by mouth every 6 (six) hours.    Dispense:  120 tablet    Refill:  0    Please fill on or after 10/04/24   Dx: M54.5 and G89.4      Follow-up: No follow-ups on file.  Marolyn Noel, MD

## 2024-08-04 NOTE — Addendum Note (Signed)
 Addended byBETHA LUCETTA CLEATRICE LELON on: 08/04/2024 04:07 PM   Modules accepted: Orders

## 2024-08-15 ENCOUNTER — Other Ambulatory Visit: Payer: Self-pay | Admitting: Cardiology

## 2024-08-15 DIAGNOSIS — I2583 Coronary atherosclerosis due to lipid rich plaque: Secondary | ICD-10-CM

## 2024-08-19 ENCOUNTER — Telehealth: Payer: Self-pay | Admitting: Cardiology

## 2024-08-19 DIAGNOSIS — I2583 Coronary atherosclerosis due to lipid rich plaque: Secondary | ICD-10-CM

## 2024-08-19 MED ORDER — ROSUVASTATIN CALCIUM 20 MG PO TABS: 20.0000 mg | ORAL_TABLET | Freq: Every day | ORAL | 0 refills | Status: DC

## 2024-08-19 NOTE — Telephone Encounter (Signed)
*  STAT* If patient is at the pharmacy, call can be transferred to refill team.   1. Which medications need to be refilled? (please list name of each medication and dose if known) rosuvastatin  (CRESTOR ) 20 MG tablet    2. Would you like to learn more about the convenience, safety, & potential cost savings by using the [facility]?     3. Are you open to using the [facility] (Type [facility].  ).   4. Which pharmacy/location (including street and city if local pharmacy) is medication to be sent to? [facility] - [place], [place] - [address]    5. Do they need a 30 day or 90 day supply? 90 day

## 2024-08-19 NOTE — Telephone Encounter (Signed)
 Returned call to pt.  She was made aware that she was overdue to see Dr. Pietro.  Got pt scheduled for 08/25/24, refill for Rosuvastatin  has been sent in.

## 2024-08-23 NOTE — Progress Notes (Unsigned)
 HPI: Follow-up coronary calcification. Calcium  score February 2020 3 which was 71st percentile. Chest CT June 2024 showed aortic atherosclerosis and calcification in the LAD.  Since last seen  Current Outpatient Medications  Medication Sig Dispense Refill   aspirin 81 MG EC tablet Take 81 mg by mouth daily.     B Complex-C (SUPER B COMPLEX PO) Take by mouth.     Cholecalciferol 3000 units TABS Take 2 tablets by mouth daily.     COENZYME Q-10 PO Take 200 mg by mouth daily.     docusate sodium (COLACE) 100 MG capsule Take 100 mg by mouth 2 (two) times daily.     fluticasone  (FLONASE ) 50 MCG/ACT nasal spray shake liquid & use 2 sprays in each nostril daily 16 g 5   furosemide  (LASIX ) 40 MG tablet Take 1 tablet (40 mg total) by mouth daily. 90 tablet 1   hyoscyamine  (LEVSIN ) 0.125 MG tablet Take 1-2 tablets (0.125-0.25 mg total) by mouth every 6 (six) hours as needed for cramping. 100 tablet 1   levothyroxine  (SYNTHROID ) 100 MCG tablet Take 1 tablet (100 mcg total) by mouth daily. 90 tablet 3   naloxone  (NARCAN ) nasal spray 4 mg/0.1 mL 1 actuation in one nostril once. May repeat in 2-3 min 1 each 2   oxyCODONE  (OXYCONTIN ) 40 mg 12 hr tablet Take 1 tablet (40 mg total) by mouth every 6 (six) hours. 120 tablet 0   oxyCODONE  (OXYCONTIN ) 40 mg 12 hr tablet Take 1 tablet (40 mg total) by mouth every 6 (six) hours. 120 tablet 0   oxyCODONE  (OXYCONTIN ) 40 mg 12 hr tablet Take 1 tablet (40 mg total) by mouth every 6 (six) hours. 120 tablet 0   oxyCODONE  (OXYCONTIN ) 80 mg 12 hr tablet Take 1 tablet (80 mg total) by mouth every 6 (six) hours. 120 tablet 0   oxyCODONE  (OXYCONTIN ) 80 mg 12 hr tablet Take 1 tablet (80 mg total) by mouth every 6 (six) hours. 120 tablet 0   oxyCODONE  (OXYCONTIN ) 80 mg 12 hr tablet Take 1 tablet (80 mg total) by mouth every 6 (six) hours. 120 tablet 0   potassium chloride  SA (KLOR-CON  M) 20 MEQ tablet TAKE ONE TABLET BY MOUTH DAILY 90 tablet 3   rosuvastatin  (CRESTOR ) 20 MG  tablet Take 1 tablet (20 mg total) by mouth daily. 30 tablet 0   Sennosides (SENNA LAX PO) Take by mouth.     No current facility-administered medications for this visit.     Past Medical History:  Diagnosis Date   Asthmatic bronchitis    Mild   Cataract    forming   Depression    Hepatitis C    Hyperthyroidism    s/p 131Iodine Rx 2007   Hypothyroidism    LBP (low back pain)    Menopause    OA (osteoarthritis of spine)    Osteoporosis     Past Surgical History:  Procedure Laterality Date   CESAREAN SECTION     COLONOSCOPY     COLONOSCOPY W/ POLYPECTOMY      Social History   Socioeconomic History   Marital status: Married    Spouse name: Beryl   Number of children: 2   Years of education: Not on file   Highest education level: Not on file  Occupational History   Occupation: retired    Associate Professor: UNEMPLOYED  Tobacco Use   Smoking status: Every Day    Current packs/day: 0.25    Average packs/day: 0.3 packs/day  for 2.9 years (0.7 ttl pk-yrs)    Types: Cigarettes    Start date: 09/15/2021    Last attempt to quit: 03/31/2013   Smokeless tobacco: Former   Tobacco comments:    Past hx vape   Vaping Use   Vaping status: Some Days  Substance and Sexual Activity   Alcohol use: Not Currently    Comment: OCC    Drug use: No   Sexual activity: Not Currently    Comment: 1st intercourse- 15, partners- 4, married- 29 yrs   Other Topics Concern   Not on file  Social History Narrative   Lives with husband   Social Drivers of Health   Financial Resource Strain: Low Risk  (01/18/2024)   Overall Financial Resource Strain (CARDIA)    Difficulty of Paying Living Expenses: Not hard at all  Food Insecurity: No Food Insecurity (01/18/2024)   Hunger Vital Sign    Worried About Running Out of Food in the Last Year: Never true    Ran Out of Food in the Last Year: Never true  Transportation Needs: No Transportation Needs (01/18/2024)   PRAPARE - Scientist, Research (physical Sciences) (Medical): No    Lack of Transportation (Non-Medical): No  Physical Activity: Insufficiently Active (01/18/2024)   Exercise Vital Sign    Days of Exercise per Week: 2 days    Minutes of Exercise per Session: 60 min  Stress: No Stress Concern Present (01/18/2024)   [employer] of Occupational Health - Occupational Stress Questionnaire    Feeling of Stress : Only a little  Social Connections: Moderately Isolated (01/18/2024)   Social Connection and Isolation Panel    Frequency of Communication with Friends and Family: More than three times a week    Frequency of Social Gatherings with Friends and Family: Three times a week    Attends Religious Services: Never    Active Member of Clubs or Organizations: No    Attends Banker Meetings: Never    Marital Status: Married  Catering Manager Violence: Not At Risk (01/18/2024)   Humiliation, Afraid, Rape, and Kick questionnaire    Fear of Current or Ex-Partner: No    Emotionally Abused: No    Physically Abused: No    Sexually Abused: No    Family History  Problem Relation Age of Onset   Colon polyps Father    Heart disease Father    Hyperlipidemia Father    Hypertension Father    Diabetes Father    Cancer Father    Coronary artery disease Father    Hypertension Other    Colon cancer Neg Hx    Pancreatic cancer Neg Hx    Stomach cancer Neg Hx    Esophageal cancer Neg Hx     ROS: no fevers or chills, productive cough, hemoptysis, dysphasia, odynophagia, melena, hematochezia, dysuria, hematuria, rash, seizure activity, orthopnea, PND, pedal edema, claudication. Remaining systems are negative.  Physical Exam: Well-developed well-nourished in no acute distress.  Skin is warm and dry.  HEENT is normal.  Neck is supple.  Chest is clear to auscultation with normal expansion.  Cardiovascular exam is regular rate and rhythm.  Abdominal exam nontender or distended. No masses palpated. Extremities show no  edema. neuro grossly intact  ECG- personally reviewed  A/P  1 coronary calcification-patient denies chest pain.  Continue aspirin and statin.  2 hyperlipidemia-continue Crestor .  Check lipids, liver and LP(a).  3 tobacco abuse-patient counseled on discontinuing.  Redell Shallow, MD

## 2024-08-25 ENCOUNTER — Encounter: Payer: Self-pay | Admitting: Cardiology

## 2024-08-25 ENCOUNTER — Ambulatory Visit: Attending: Internal Medicine | Admitting: Cardiology

## 2024-08-25 VITALS — BP 129/78 | HR 58 | Ht 65.0 in | Wt 130.1 lb

## 2024-08-25 DIAGNOSIS — E7800 Pure hypercholesterolemia, unspecified: Secondary | ICD-10-CM

## 2024-08-25 DIAGNOSIS — I2583 Coronary atherosclerosis due to lipid rich plaque: Secondary | ICD-10-CM

## 2024-08-25 DIAGNOSIS — Z720 Tobacco use: Secondary | ICD-10-CM | POA: Diagnosis not present

## 2024-08-25 NOTE — Patient Instructions (Signed)

## 2024-08-26 ENCOUNTER — Ambulatory Visit: Payer: Self-pay | Admitting: Cardiology

## 2024-08-26 DIAGNOSIS — I2583 Coronary atherosclerosis due to lipid rich plaque: Secondary | ICD-10-CM

## 2024-08-26 DIAGNOSIS — E7800 Pure hypercholesterolemia, unspecified: Secondary | ICD-10-CM

## 2024-08-26 LAB — HEPATIC FUNCTION PANEL
ALT: 20 IU/L (ref 0–32)
AST: 32 IU/L (ref 0–40)
Albumin: 4.5 g/dL (ref 3.9–4.9)
Alkaline Phosphatase: 79 IU/L (ref 49–135)
Bilirubin Total: <0.2 mg/dL (ref 0.0–1.2)
Bilirubin, Direct: <0.08 mg/dL (ref 0.00–0.40)
Total Protein: 6.9 g/dL (ref 6.0–8.5)

## 2024-08-26 LAB — LIPOPROTEIN A (LPA): Lipoprotein (a): 89.7 nmol/L — ABNORMAL HIGH (ref <75.0)

## 2024-08-26 LAB — LIPID PANEL
Chol/HDL Ratio: 1.7 ratio (ref 0.0–4.4)
Cholesterol, Total: 197 mg/dL (ref 100–199)
HDL: 114 mg/dL (ref >39)
LDL Chol Calc (NIH): 69 mg/dL (ref 0–99)
Triglycerides: 78 mg/dL (ref 0–149)
VLDL Cholesterol Cal: 14 mg/dL (ref 5–40)

## 2024-08-26 MED ORDER — ROSUVASTATIN CALCIUM 40 MG PO TABS: 40.0000 mg | ORAL_TABLET | Freq: Every day | ORAL | 3 refills | Status: AC

## 2024-09-16 ENCOUNTER — Encounter: Payer: Self-pay | Admitting: *Deleted

## 2024-09-16 NOTE — Progress Notes (Signed)
 Abigail Wilson                                          MRN: [id number]   09/16/2024   The [facility] Quality Team Specialist reviewed this patient medical record for the purposes of chart review for care gap closure. The following were reviewed: chart review for care gap closure-kidney health evaluation for diabetes:eGFR  and uACR.    [facility] Quality Team

## 2024-10-06 ENCOUNTER — Telehealth: Payer: Self-pay

## 2024-10-06 ENCOUNTER — Other Ambulatory Visit (HOSPITAL_COMMUNITY): Payer: Self-pay

## 2024-10-06 NOTE — Telephone Encounter (Signed)
 Pharmacy Patient Advocate Encounter   Received notification from [facility] KEY that prior authorization for oxyCODONE  (OXYCONTIN ) 40 mg 12 hr tablet  is required/requested.   Insurance verification completed.   The patient is insured through [facility] MEDIC[facility].   Per test claim: PA required; PA submitted to above mentioned insurance via Latent Key/confirmation #/EOC [id number] Status is pending

## 2024-10-10 NOTE — Telephone Encounter (Signed)
 Pharmacy Patient Advocate Encounter  Received notification from [facility] MEDICARE  that Prior Authorization for OxyCONTIN  40MG  er tablets  has been APPROVED from 10/06/2024 to 09/14/2025   PA #/Case ID/Reference #: [id number]

## 2024-10-11 ENCOUNTER — Other Ambulatory Visit (HOSPITAL_COMMUNITY): Payer: Self-pay

## 2024-11-01 ENCOUNTER — Ambulatory Visit: Admitting: Internal Medicine

## 2025-01-19 ENCOUNTER — Ambulatory Visit
